# Patient Record
Sex: Male | Born: 1940 | Race: White | Hispanic: No | Marital: Single | Smoking: Never smoker
Health system: Southern US, Community
[De-identification: ages and names within clinical notes are randomized; demographics above are authoritative.]

## PROBLEM LIST (undated history)

## (undated) DIAGNOSIS — N189 Chronic kidney disease, unspecified: Secondary | ICD-10-CM

## (undated) DIAGNOSIS — E039 Hypothyroidism, unspecified: Secondary | ICD-10-CM

## (undated) DIAGNOSIS — F419 Anxiety disorder, unspecified: Secondary | ICD-10-CM

## (undated) DIAGNOSIS — M1990 Unspecified osteoarthritis, unspecified site: Secondary | ICD-10-CM

## (undated) DIAGNOSIS — R634 Abnormal weight loss: Secondary | ICD-10-CM

## (undated) DIAGNOSIS — K219 Gastro-esophageal reflux disease without esophagitis: Secondary | ICD-10-CM

## (undated) HISTORY — PX: LAMINECTOMY: SHX219

## (undated) HISTORY — PX: RENAL BIOPSY: SHX156

## (undated) HISTORY — PX: HAND SURGERY: SHX662

---

## 2000-09-07 ENCOUNTER — Ambulatory Visit (HOSPITAL_COMMUNITY): Admission: RE | Admit: 2000-09-07 | Discharge: 2000-09-07 | Payer: Self-pay | Admitting: Gastroenterology

## 2002-05-04 HISTORY — PX: ROTATOR CUFF REPAIR: SHX139

## 2004-03-21 ENCOUNTER — Ambulatory Visit: Payer: Self-pay | Admitting: Internal Medicine

## 2004-04-04 ENCOUNTER — Ambulatory Visit: Payer: Self-pay | Admitting: Internal Medicine

## 2004-05-28 ENCOUNTER — Ambulatory Visit: Payer: Self-pay | Admitting: Internal Medicine

## 2004-07-10 ENCOUNTER — Ambulatory Visit: Payer: Self-pay | Admitting: Internal Medicine

## 2004-12-29 ENCOUNTER — Ambulatory Visit: Payer: Self-pay | Admitting: Internal Medicine

## 2005-06-24 ENCOUNTER — Ambulatory Visit: Payer: Self-pay | Admitting: Internal Medicine

## 2011-04-14 ENCOUNTER — Other Ambulatory Visit (HOSPITAL_COMMUNITY): Payer: Self-pay | Admitting: Orthopaedic Surgery

## 2011-04-23 ENCOUNTER — Encounter (HOSPITAL_COMMUNITY): Payer: Self-pay | Admitting: Pharmacy Technician

## 2011-05-01 ENCOUNTER — Encounter (HOSPITAL_COMMUNITY)
Admission: RE | Admit: 2011-05-01 | Discharge: 2011-05-01 | Disposition: A | Discharge status: Home / Self Care | Payer: Medicare Other | Source: Ambulatory Visit | Attending: Orthopaedic Surgery | Admitting: Orthopaedic Surgery

## 2011-05-01 ENCOUNTER — Encounter (HOSPITAL_COMMUNITY): Payer: Self-pay

## 2011-05-01 HISTORY — DX: Gastro-esophageal reflux disease without esophagitis: K21.9

## 2011-05-01 HISTORY — DX: Unspecified osteoarthritis, unspecified site: M19.90

## 2011-05-01 LAB — CBC
MCH: 31.2 pg (ref 26.0–34.0)
MCHC: 33 g/dL (ref 30.0–36.0)
MCV: 94.4 fL (ref 78.0–100.0)
Platelets: 186 10*3/uL (ref 150–400)

## 2011-05-01 LAB — BASIC METABOLIC PANEL
BUN: 21 mg/dL (ref 6–23)
CO2: 30 mEq/L (ref 19–32)
Calcium: 10.6 mg/dL — ABNORMAL HIGH (ref 8.4–10.5)
GFR calc non Af Amer: 69 mL/min — ABNORMAL LOW (ref >90)
Glucose, Bld: 81 mg/dL (ref 70–99)

## 2011-05-01 LAB — URINALYSIS, ROUTINE W REFLEX MICROSCOPIC
Bilirubin Urine: NEGATIVE
Glucose, UA: NEGATIVE mg/dL
Ketones, ur: NEGATIVE mg/dL
Leukocytes, UA: NEGATIVE
Protein, ur: NEGATIVE mg/dL

## 2011-05-01 NOTE — Patient Instructions (Signed)
Lance [name]  05/01/2011   Your procedure is scheduled on:  Friday 05/08/2011  Report to [facility] at 0530 AM.  Call this number if you have problems the morning of surgery: [phone]   Remember:   Do not eat food:After Midnight.  May have clear liquids:until Midnight .  Clear liquids include soda, tea, black coffee, apple or grape juice, broth.  Take these medicines the morning of surgery with A SIP OF WATER: NONE   Do not wear jewelry, make-up or nail polish.  Do not wear lotions, powders, or perfumes.   Do not shave 48 hours prior to surgery.(women only)  Do not bring valuables to the hospital.  Contacts, dentures or bridgework may not be worn into surgery.  Leave suitcase in the car. After surgery it may be brought to your room.  For patients admitted to the hospital, checkout time is 11:00 AM the day of discharge.   Patients discharged the day of surgery will not be allowed to drive home.  Name and phone number of your driver:   Special Instructions: CHG Shower Use Special Wash: 1/2 bottle night before surgery and 1/2 bottle morning of surgery.   Please read over the following fact sheets that you were given: MRSA Information

## 2011-05-08 ENCOUNTER — Inpatient Hospital Stay (HOSPITAL_COMMUNITY)
Admission: RE | Admit: 2011-05-08 | Discharge: 2011-05-12 | DRG: 470 | Disposition: A | Discharge status: Home Health | Payer: Medicare Other | Source: Ambulatory Visit | Attending: Orthopaedic Surgery | Admitting: Orthopaedic Surgery

## 2011-05-08 ENCOUNTER — Encounter (HOSPITAL_COMMUNITY): Payer: Self-pay | Admitting: Anesthesiology

## 2011-05-08 ENCOUNTER — Encounter (HOSPITAL_COMMUNITY): Payer: Self-pay

## 2011-05-08 ENCOUNTER — Encounter (HOSPITAL_COMMUNITY)
Admission: RE | Disposition: A | Discharge status: Home Health | Payer: Self-pay | Source: Ambulatory Visit | Attending: Orthopaedic Surgery

## 2011-05-08 ENCOUNTER — Inpatient Hospital Stay (HOSPITAL_COMMUNITY): Payer: Medicare Other | Admitting: Anesthesiology

## 2011-05-08 ENCOUNTER — Other Ambulatory Visit: Payer: Self-pay

## 2011-05-08 ENCOUNTER — Inpatient Hospital Stay (HOSPITAL_COMMUNITY): Payer: Medicare Other

## 2011-05-08 DIAGNOSIS — M1610 Unilateral primary osteoarthritis, unspecified hip: Principal | ICD-10-CM | POA: Diagnosis present

## 2011-05-08 DIAGNOSIS — K219 Gastro-esophageal reflux disease without esophagitis: Secondary | ICD-10-CM | POA: Diagnosis present

## 2011-05-08 DIAGNOSIS — Z01812 Encounter for preprocedural laboratory examination: Secondary | ICD-10-CM

## 2011-05-08 DIAGNOSIS — M169 Osteoarthritis of hip, unspecified: Secondary | ICD-10-CM

## 2011-05-08 HISTORY — PX: TOTAL HIP ARTHROPLASTY: SHX124

## 2011-05-08 LAB — TYPE AND SCREEN
ABO/RH(D): O POS
Antibody Screen: NEGATIVE

## 2011-05-08 LAB — ABO/RH: ABO/RH(D): O POS

## 2011-05-08 SURGERY — ARTHROPLASTY, HIP, TOTAL, ANTERIOR APPROACH
Anesthesia: General | Site: Hip | Laterality: Right | Wound class: Clean

## 2011-05-08 MED ORDER — ROCURONIUM BROMIDE 100 MG/10ML IV SOLN
INTRAVENOUS | Status: DC | PRN
  Administered 2011-05-08: 20 mg via INTRAVENOUS
  Administered 2011-05-08: 50 mg via INTRAVENOUS
  Administered 2011-05-08: 10 mg via INTRAVENOUS

## 2011-05-08 MED ORDER — METOCLOPRAMIDE HCL 5 MG/ML IJ SOLN: 5.0000 mg | Freq: Three times a day (TID) | INTRAMUSCULAR | Status: DC | PRN

## 2011-05-08 MED ORDER — RIVAROXABAN 10 MG PO TABS
10.0000 mg | ORAL_TABLET | Freq: Every day | ORAL | Status: DC
  Administered 2011-05-09 – 2011-05-12 (×4): 10 mg via ORAL
  Filled 2011-05-08 (×4): qty 1

## 2011-05-08 MED ORDER — ACETAMINOPHEN 650 MG RE SUPP: 650.0000 mg | Freq: Four times a day (QID) | RECTAL | Status: DC | PRN

## 2011-05-08 MED ORDER — HYDROMORPHONE HCL PF 1 MG/ML IJ SOLN
0.2500 mg | INTRAMUSCULAR | Status: DC | PRN
  Administered 2011-05-08 (×2): 0.5 mg via INTRAVENOUS

## 2011-05-08 MED ORDER — OXYCODONE HCL 5 MG PO TABS
5.0000 mg | ORAL_TABLET | ORAL | Status: DC | PRN
Start: 2011-05-08 — End: 2011-05-12

## 2011-05-08 MED ORDER — MIDAZOLAM HCL 5 MG/5ML IJ SOLN
INTRAMUSCULAR | Status: DC | PRN
  Administered 2011-05-08: 2 mg via INTRAVENOUS

## 2011-05-08 MED ORDER — ONDANSETRON HCL 4 MG/2ML IJ SOLN
INTRAMUSCULAR | Status: DC | PRN
  Administered 2011-05-08: 4 mg via INTRAVENOUS

## 2011-05-08 MED ORDER — MORPHINE SULFATE (PF) 1 MG/ML IV SOLN
INTRAVENOUS | Status: DC
  Administered 2011-05-08: 13 mg via INTRAVENOUS
  Administered 2011-05-08: 4 mg via INTRAVENOUS
  Administered 2011-05-08: 1 mg via INTRAVENOUS
  Administered 2011-05-08: 11:00:00 via INTRAVENOUS
  Administered 2011-05-09: 5 mg via INTRAVENOUS
  Administered 2011-05-09: 6 mg via INTRAVENOUS
  Administered 2011-05-09: 7 mg via INTRAVENOUS
  Administered 2011-05-09: 08:00:00 via INTRAVENOUS
  Administered 2011-05-09: 3 mL via INTRAVENOUS
  Administered 2011-05-09: 9 mg via INTRAVENOUS
  Administered 2011-05-09: 3 mg via INTRAVENOUS
  Administered 2011-05-10: 08:00:00 via INTRAVENOUS
  Administered 2011-05-10: 3 mg via INTRAVENOUS
  Administered 2011-05-10: 5 mg via INTRAVENOUS
  Administered 2011-05-10: 12 mg via INTRAVENOUS
  Administered 2011-05-10: 4 mg via INTRAVENOUS
  Administered 2011-05-10: 3 mL via INTRAVENOUS
  Administered 2011-05-10: 2 mg via INTRAVENOUS
  Administered 2011-05-10: 2 mL via INTRAVENOUS
  Administered 2011-05-11: 2 mg via INTRAVENOUS
  Administered 2011-05-11: 3.96 mg via INTRAVENOUS
  Administered 2011-05-11: 6 mg via INTRAVENOUS
  Filled 2011-05-08 (×5): qty 25

## 2011-05-08 MED ORDER — ZOLPIDEM TARTRATE 5 MG PO TABS
5.0000 mg | ORAL_TABLET | Freq: Every evening | ORAL | Status: DC | PRN
  Administered 2011-05-11: 5 mg via ORAL
  Filled 2011-05-08: qty 1

## 2011-05-08 MED ORDER — PROPOFOL 10 MG/ML IV EMUL
INTRAVENOUS | Status: DC | PRN
  Administered 2011-05-08: 50 mg via INTRAVENOUS
  Administered 2011-05-08: 180 mg via INTRAVENOUS

## 2011-05-08 MED ORDER — ACETAMINOPHEN 10 MG/ML IV SOLN
INTRAVENOUS | Status: DC | PRN
  Administered 2011-05-08: 1000 mg via INTRAVENOUS

## 2011-05-08 MED ORDER — 0.9 % SODIUM CHLORIDE (POUR BTL) OPTIME
TOPICAL | Status: DC | PRN
  Administered 2011-05-08: 1000 mL

## 2011-05-08 MED ORDER — DOCUSATE SODIUM 100 MG PO CAPS
100.0000 mg | ORAL_CAPSULE | Freq: Two times a day (BID) | ORAL | Status: DC
  Administered 2011-05-08 – 2011-05-12 (×8): 100 mg via ORAL
  Filled 2011-05-08 (×10): qty 1

## 2011-05-08 MED ORDER — GLYCOPYRROLATE 0.2 MG/ML IJ SOLN
INTRAMUSCULAR | Status: DC | PRN
  Administered 2011-05-08: .5 mg via INTRAVENOUS

## 2011-05-08 MED ORDER — DIPHENHYDRAMINE HCL 12.5 MG/5ML PO ELIX: 12.5000 mg | ORAL_SOLUTION | ORAL | Status: DC | PRN

## 2011-05-08 MED ORDER — ONDANSETRON HCL 4 MG/2ML IJ SOLN: 4.0000 mg | Freq: Four times a day (QID) | INTRAMUSCULAR | Status: DC | PRN

## 2011-05-08 MED ORDER — ALUM & MAG HYDROXIDE-SIMETH 200-200-20 MG/5ML PO SUSP: 30.0000 mL | ORAL | Status: DC | PRN

## 2011-05-08 MED ORDER — PHENOL 1.4 % MT LIQD: 1.0000 | OROMUCOSAL | Status: DC | PRN

## 2011-05-08 MED ORDER — LIDOCAINE HCL (CARDIAC) 20 MG/ML IV SOLN
INTRAVENOUS | Status: DC | PRN
  Administered 2011-05-08: 100 mg via INTRAVENOUS

## 2011-05-08 MED ORDER — METHOCARBAMOL 500 MG PO TABS
500.0000 mg | ORAL_TABLET | Freq: Four times a day (QID) | ORAL | Status: DC | PRN
  Administered 2011-05-09 – 2011-05-11 (×6): 500 mg via ORAL
  Filled 2011-05-08 (×6): qty 1

## 2011-05-08 MED ORDER — METOCLOPRAMIDE HCL 10 MG PO TABS: 5.0000 mg | ORAL_TABLET | Freq: Three times a day (TID) | ORAL | Status: DC | PRN

## 2011-05-08 MED ORDER — NALOXONE HCL 0.4 MG/ML IJ SOLN: 0.4000 mg | INTRAMUSCULAR | Status: DC | PRN

## 2011-05-08 MED ORDER — MENTHOL 3 MG MT LOZG
1.0000 | LOZENGE | OROMUCOSAL | Status: DC | PRN
  Filled 2011-05-08: qty 9

## 2011-05-08 MED ORDER — CEFAZOLIN SODIUM 1-5 GM-% IV SOLN
1.0000 g | Freq: Three times a day (TID) | INTRAVENOUS | Status: AC
  Administered 2011-05-08 – 2011-05-09 (×3): 1 g via INTRAVENOUS
  Filled 2011-05-08 (×3): qty 50

## 2011-05-08 MED ORDER — SODIUM CHLORIDE 0.9 % IJ SOLN: 9.0000 mL | INTRAMUSCULAR | Status: DC | PRN

## 2011-05-08 MED ORDER — ACETAMINOPHEN 325 MG PO TABS: 650.0000 mg | ORAL_TABLET | Freq: Four times a day (QID) | ORAL | Status: DC | PRN

## 2011-05-08 MED ORDER — DIPHENHYDRAMINE HCL 50 MG/ML IJ SOLN: 12.5000 mg | Freq: Four times a day (QID) | INTRAMUSCULAR | Status: DC | PRN

## 2011-05-08 MED ORDER — LACTATED RINGERS IV SOLN
INTRAVENOUS | Status: DC
  Administered 2011-05-08: 1000 mL via INTRAVENOUS
  Administered 2011-05-08 (×2): via INTRAVENOUS

## 2011-05-08 MED ORDER — MORPHINE SULFATE 2 MG/ML IJ SOLN: 1.0000 mg | INTRAMUSCULAR | Status: DC | PRN

## 2011-05-08 MED ORDER — SIMVASTATIN 10 MG PO TABS
10.0000 mg | ORAL_TABLET | Freq: Every day | ORAL | Status: DC
  Administered 2011-05-08 – 2011-05-11 (×4): 10 mg via ORAL
  Filled 2011-05-08 (×5): qty 1

## 2011-05-08 MED ORDER — METHOCARBAMOL 100 MG/ML IJ SOLN
500.0000 mg | Freq: Four times a day (QID) | INTRAVENOUS | Status: DC | PRN
  Administered 2011-05-08 (×2): 500 mg via INTRAVENOUS
  Filled 2011-05-08 (×2): qty 5

## 2011-05-08 MED ORDER — ONDANSETRON HCL 4 MG PO TABS: 4.0000 mg | ORAL_TABLET | Freq: Four times a day (QID) | ORAL | Status: DC | PRN

## 2011-05-08 MED ORDER — PROMETHAZINE HCL 25 MG/ML IJ SOLN: 6.2500 mg | INTRAMUSCULAR | Status: DC | PRN

## 2011-05-08 MED ORDER — SODIUM CHLORIDE 0.9 % IV SOLN
INTRAVENOUS | Status: DC
  Administered 2011-05-08 – 2011-05-10 (×3): via INTRAVENOUS

## 2011-05-08 MED ORDER — CEFAZOLIN SODIUM 1-5 GM-% IV SOLN
1.0000 g | INTRAVENOUS | Status: AC
  Administered 2011-05-08: 1 g via INTRAVENOUS

## 2011-05-08 MED ORDER — NEOSTIGMINE METHYLSULFATE 1 MG/ML IJ SOLN
INTRAMUSCULAR | Status: DC | PRN
Start: 2011-05-08 — End: 2011-05-08
  Administered 2011-05-08: 4 mg via INTRAVENOUS

## 2011-05-08 MED ORDER — DIPHENHYDRAMINE HCL 12.5 MG/5ML PO ELIX
12.5000 mg | ORAL_SOLUTION | Freq: Four times a day (QID) | ORAL | Status: DC | PRN
  Filled 2011-05-08: qty 5

## 2011-05-08 MED ORDER — HYDROCODONE-ACETAMINOPHEN 5-325 MG PO TABS
1.0000 | ORAL_TABLET | ORAL | Status: DC | PRN
  Administered 2011-05-11: 2 via ORAL
  Administered 2011-05-11: 1 via ORAL
  Administered 2011-05-11 – 2011-05-12 (×4): 2 via ORAL
  Filled 2011-05-08 (×2): qty 2
  Filled 2011-05-08: qty 1
  Filled 2011-05-08 (×3): qty 2

## 2011-05-08 MED ORDER — SUFENTANIL CITRATE 50 MCG/ML IV SOLN
INTRAVENOUS | Status: DC | PRN
  Administered 2011-05-08 (×3): 10 ug via INTRAVENOUS
  Administered 2011-05-08: 15 ug via INTRAVENOUS
  Administered 2011-05-08: 5 ug via INTRAVENOUS
  Administered 2011-05-08: 10 ug via INTRAVENOUS
  Administered 2011-05-08: 15 ug via INTRAVENOUS
  Administered 2011-05-08: 10 ug via INTRAVENOUS

## 2011-05-08 MED ORDER — HYDROMORPHONE HCL PF 1 MG/ML IJ SOLN
INTRAMUSCULAR | Status: DC | PRN
  Administered 2011-05-08: 1 mg via INTRAVENOUS

## 2011-05-08 SURGICAL SUPPLY — 34 items
BAG ZIPLOCK 12X15 (MISCELLANEOUS) ×4 IMPLANT
BLADE SAW SGTL 18X1.27X75 (BLADE) ×2 IMPLANT
CELLS DAT CNTRL 66122 CELL SVR (MISCELLANEOUS) ×1 IMPLANT
CLOTH BEACON ORANGE TIMEOUT ST (SAFETY) ×2 IMPLANT
DRAPE C-ARM 42X72 X-RAY (DRAPES) ×2 IMPLANT
DRAPE STERI IOBAN 125X83 (DRAPES) ×2 IMPLANT
DRAPE U-SHAPE 47X51 STRL (DRAPES) ×6 IMPLANT
DRSG MEPILEX BORDER 4X8 (GAUZE/BANDAGES/DRESSINGS) ×2 IMPLANT
DURAPREP 26ML APPLICATOR (WOUND CARE) ×2 IMPLANT
ELECT BLADE TIP CTD 4 INCH (ELECTRODE) ×2 IMPLANT
ELECT REM PT RETURN 9FT ADLT (ELECTROSURGICAL) ×2
ELECTRODE REM PT RTRN 9FT ADLT (ELECTROSURGICAL) ×1 IMPLANT
FACESHIELD LNG OPTICON STERILE (SAFETY) ×8 IMPLANT
GAUZE XEROFORM 1X8 LF (GAUZE/BANDAGES/DRESSINGS) ×2 IMPLANT
GLOVE BIO SURGEON STRL SZ7 (GLOVE) IMPLANT
GLOVE BIO SURGEON STRL SZ7.5 (GLOVE) ×2 IMPLANT
GLOVE BIOGEL PI IND STRL 7.5 (GLOVE) IMPLANT
GLOVE BIOGEL PI IND STRL 8 (GLOVE) ×1 IMPLANT
GLOVE BIOGEL PI INDICATOR 7.5 (GLOVE)
GLOVE BIOGEL PI INDICATOR 8 (GLOVE) ×1
GLOVE ECLIPSE 7.0 STRL STRAW (GLOVE) ×2 IMPLANT
GOWN STRL REIN XL XLG (GOWN DISPOSABLE) ×2 IMPLANT
KIT BASIN OR (CUSTOM PROCEDURE TRAY) ×2 IMPLANT
PACK TOTAL JOINT (CUSTOM PROCEDURE TRAY) ×2 IMPLANT
PADDING CAST COTTON 6X4 STRL (CAST SUPPLIES) ×2 IMPLANT
RTRCTR WOUND ALEXIS 18CM MED (MISCELLANEOUS) ×2
STAPLER SKIN PROX WIDE 3.9 (STAPLE) IMPLANT
SUT ETHIBOND NAB CT1 #1 30IN (SUTURE) ×2 IMPLANT
SUT VIC AB 1 CT1 36 (SUTURE) ×2 IMPLANT
SUT VIC AB 2-0 CT1 27 (SUTURE) ×1
SUT VIC AB 2-0 CT1 TAPERPNT 27 (SUTURE) ×1 IMPLANT
TOWEL OR 17X26 10 PK STRL BLUE (TOWEL DISPOSABLE) ×4 IMPLANT
TOWEL OR NON WOVEN STRL DISP B (DISPOSABLE) ×2 IMPLANT
TRAY FOLEY CATH 14FRSI W/METER (CATHETERS) ×2 IMPLANT

## 2011-05-08 NOTE — Brief Op Note (Signed)
05/08/2011  9:43 AM  PATIENT:  Lance [name]  71 y.o. male  PRE-OPERATIVE DIAGNOSIS:  Severe osteoarthritis Right Hip  POST-OPERATIVE DIAGNOSIS:  Severe osteoarthritis Right Hip  PROCEDURE:  Procedure(s): TOTAL HIP ARTHROPLASTY ANTERIOR APPROACH  SURGEON:  Surgeon(s): Kathryne Hitch  PHYSICIAN ASSISTANT:   ASSISTANTS: none   ANESTHESIA:   general  EBL:  Total I/O In: 2000 [I.V.:2000] Out: 450 [Urine:150; Blood:300]  BLOOD ADMINISTERED:none  DRAINS: none   LOCAL MEDICATIONS USED:  NONE  SPECIMEN:  No Specimen  DISPOSITION OF SPECIMEN:  N/A  COUNTS:  YES  TOURNIQUET:  * No tourniquets in log *  DICTATION: .Other Dictation: Dictation Number [id number]  PLAN OF CARE: Admit to inpatient   PATIENT DISPOSITION:  PACU - hemodynamically stable.   Delay start of Pharmacological VTE agent (>24hrs) due to surgical blood loss or risk of bleeding:  {YES/NO/NOT APPLICABLE:20182

## 2011-05-08 NOTE — Progress Notes (Signed)
Utilization review completed.  

## 2011-05-08 NOTE — Anesthesia Preprocedure Evaluation (Addendum)
Anesthesia Evaluation  Patient identified by MRN, date of birth, ID band Patient awake    Reviewed: Allergy & Precautions, H&P , NPO status , Patient's Chart, lab work & pertinent test results, reviewed documented beta blocker date and time   Airway Mallampati: II TM Distance: >3 FB Neck ROM: Full    Dental   Pulmonary neg pulmonary ROS,  clear to auscultation        Cardiovascular neg cardio ROS Regular Normal Denies cardiac symptoms   Neuro/Psych Negative Neurological ROS  Negative Psych ROS   GI/Hepatic negative GI ROS, Neg liver ROS,   Endo/Other  Negative Endocrine ROS  Renal/GU negative Renal ROS  Genitourinary negative   Musculoskeletal negative musculoskeletal ROS (+)   Abdominal   Peds negative pediatric ROS (+)  Hematology negative hematology ROS (+)   Anesthesia Other Findings Lower front bridge  Reproductive/Obstetrics negative OB ROS                           Anesthesia Physical Anesthesia Plan  ASA: I  Anesthesia Plan: General   Post-op Pain Management:    Induction: Intravenous  Airway Management Planned: Oral ETT  Additional Equipment:   Intra-op Plan:   Post-operative Plan: Extubation in OR  Informed Consent: I have reviewed the patients History and Physical, chart, labs and discussed the procedure including the risks, benefits and alternatives for the proposed anesthesia with the patient or authorized representative who has indicated his/her understanding and acceptance.   Dental advisory given  Plan Discussed with: CRNA and Surgeon  Anesthesia Plan Comments:        Anesthesia Quick Evaluation

## 2011-05-08 NOTE — Transfer of Care (Signed)
Immediate Anesthesia Transfer of Care Note  Patient: Lance [name]  Procedure(s) Performed:  TOTAL HIP ARTHROPLASTY ANTERIOR APPROACH - Right Total Hip Arthroplasty, Direct Anterior Approach (C-Arm)  Patient Location: PACU  Anesthesia Type: General  Level of Consciousness: awake, oriented, sedated and responds to stimulation  Airway & Oxygen Therapy: Patient Spontanous Breathing and Patient connected to face mask oxygen  Post-op Assessment: Report given to PACU RN and Post -op Vital signs reviewed and stable  Post vital signs: stable  Complications: No apparent anesthesia complications

## 2011-05-08 NOTE — H&P (Signed)
Lance [name] is an 71 y.o. male.   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  This had lead to severe right hip pain and degenerative arthritis which had greatly effected his quality of life.  He now wishes to proceed with a right total hip replacement given the failure of conservative treatment.  He understands fully the risks of acute blood loss anemia, DVT, PE and the difficulty involved with his deformity.  The goals are decreased pain and improved function and quality of life.  Past Medical History  Diagnosis Date  . GERD (gastroesophageal reflux disease)   . Arthritis     osteoarthritis    Past Surgical History  Procedure Date  . Rotator cuff repair 2004    right rotator cuff repair    History reviewed. No pertinent family history. Social History:  does not have a smoking history on file. He does not have any smokeless tobacco history on file. He reports that he drinks alcohol. He reports that he does not use illicit drugs.  Allergies: No Known Allergies  Medications Prior to Admission  Medication Dose Route Frequency Provider Last Rate Last Dose  . ceFAZolin (ANCEF) IVPB 1 g/50 mL premix  1 g Intravenous 60 min Pre-Op Kathryne Hitch       No current outpatient prescriptions on file as of 05/08/2011.    No results found for this or any previous visit (from the past 48 hour(s)). No results found.  Review of Systems  All other systems reviewed and are negative.    Blood pressure 136/90, pulse 98, temperature 97.5 F (36.4 C), temperature source Oral, resp. rate 18, SpO2 97.00%. Physical Exam  Constitutional: He is oriented to person, place, and time. He appears well-developed and well-nourished.  HENT:  Head: Normocephalic and atraumatic.  Eyes: EOM are normal. Pupils are equal, round, and reactive to light.  Neck: Normal range of motion. Neck supple.  Cardiovascular: Normal  rate and regular rhythm.   Respiratory: Effort normal and breath sounds normal.  GI: Soft. Bowel sounds are normal.  Musculoskeletal:       Right hip: He exhibits decreased range of motion, bony tenderness and crepitus.  Neurological: He is alert and oriented to person, place, and time.  Skin: Skin is warm and dry.  Psychiatric: He has a normal mood and affect.     Assessment/Plan To the OR today for a right total hip replacement followed by admission as an inpatient.  Lerline Valdivia Y 05/08/2011, 6:59 AM

## 2011-05-08 NOTE — Anesthesia Postprocedure Evaluation (Signed)
  Anesthesia Post-op Note  Patient: Lance [name]  Procedure(s) Performed:  TOTAL HIP ARTHROPLASTY ANTERIOR APPROACH - Right Total Hip Arthroplasty, Direct Anterior Approach (C-Arm)  Patient Location: PACU  Anesthesia Type: General  Level of Consciousness: oriented and sedated  Airway and Oxygen Therapy: Patient Spontanous Breathing and Patient connected to nasal cannula oxygen  Post-op Pain: mild  Post-op Assessment: Post-op Vital signs reviewed, Patient's Cardiovascular Status Stable, Respiratory Function Stable and Patent Airway  Post-op Vital Signs: stable  Complications: No apparent anesthesia complications

## 2011-05-09 ENCOUNTER — Encounter (HOSPITAL_COMMUNITY): Payer: Self-pay | Admitting: Orthopaedic Surgery

## 2011-05-09 LAB — CBC
MCHC: 33.6 g/dL (ref 30.0–36.0)
Platelets: 166 10*3/uL (ref 150–400)
RDW: 14 % (ref 11.5–15.5)
WBC: 5.4 10*3/uL (ref 4.0–10.5)

## 2011-05-09 LAB — BASIC METABOLIC PANEL
Chloride: 100 mEq/L (ref 96–112)
GFR calc Af Amer: 86 mL/min — ABNORMAL LOW (ref >90)
GFR calc non Af Amer: 74 mL/min — ABNORMAL LOW (ref >90)
Potassium: 4.5 mEq/L (ref 3.5–5.1)

## 2011-05-09 NOTE — Progress Notes (Signed)
Physical Therapy Evaluation Patient Details Name: Lance [name] MRN: [id number] DOB: January 26, 1941 Today's Date: 05/09/2011 [id number] Problem List:  Patient Active Problem List  Diagnoses  . Degenerative arthritis of hip    Past Medical History:  Past Medical History  Diagnosis Date  . GERD (gastroesophageal reflux disease)   . Arthritis     osteoarthritis   Past Surgical History:  Past Surgical History  Procedure Date  . Rotator cuff repair 2004    right rotator cuff repair    PT Assessment/Plan/Recommendation PT Assessment Clinical Impression Statement: pt s/p direct Ant. THA tolerated ambulation first time up.  Pt can benefit from PT to maximize functional mobility, ROM, strength to DC to home PT Recommendation/Assessment: Patient will need skilled PT in the acute care venue PT Problem List: Decreased strength;Decreased range of motion;Decreased activity tolerance;Decreased mobility;Decreased knowledge of precautions;Pain PT Therapy Diagnosis : Difficulty walking;Acute pain PT Plan PT Frequency: 7X/week PT Treatment/Interventions: DME instruction;Gait training;Stair training;Functional mobility training;Therapeutic activities;Therapeutic exercise;Patient/family education PT Recommendation Recommendations for Other Services: OT consult Follow Up Recommendations: Home health PT;24 hour supervision/assistance Equipment Recommended:  (pt to find out if he has access to RW or Sw) PT Goals  Acute Rehab PT Goals PT Goal Formulation: With patient Time For Goal Achievement: 7 days Pt will go Supine/Side to Sit: with supervision;with +2 total assist PT Goal: Supine/Side to Sit - Progress: Not met Pt will go Sit to Supine/Side: with supervision PT Goal: Sit to Supine/Side - Progress: Not met Pt will go Sit to Stand: with supervision PT Goal: Sit to Stand - Progress: Not met Pt will go Stand to Sit: with supervision PT Goal: Stand to Sit - Progress: Not met Pt will  Ambulate: >150 feet;with supervision;with rolling walker PT Goal: Ambulate - Progress: Not met Pt will Go Up / Down Stairs: 1-2 stairs;with min assist;with least restrictive assistive device PT Goal: Up/Down Stairs - Progress: Not met Pt will Perform Home Exercise Program: with supervision, verbal cues required/provided  PT Evaluation Precautions/Restrictions  Restrictions Weight Bearing Restrictions: No Prior Functioning  Home Living Lives With: Family Receives Help From: Family Type of Home: House Home Layout: One level Home Access: Stairs to enter Entrance Stairs-Rails: None Entrance Stairs-Number of Steps: 1 (1 x 2) Home Adaptive Equipment:  (pt unsure if RW orSW) Prior Function Level of Independence: Independent with basic ADLs Able to Take Stairs?: Yes Driving: Yes Vocation: Retired Financial risk analyst Arousal/Alertness: Awake/alert Overall Cognitive Status: Appears within functional limits for tasks assessed Orientation Level: Oriented X4 Sensation/Coordination Sensation Light Touch: Appears Intact Coordination Gross Motor Movements are Fluid and Coordinated: Yes Fine Motor Movements are Fluid and Coordinated: Yes Extremity Assessment RUE Assessment RUE Assessment: Within Functional Limits LUE Assessment LUE Assessment: Within Functional Limits RLE Assessment RLE Assessment: Exceptions to [facility] RLE Strength RLE Overall Strength Comments: pt able to advance RLE, did requiore assist with heel slides LLE Assessment LLE Assessment: Within Functional Limits Mobility (including Balance) Bed Mobility Bed Mobility: Yes Supine to Sit: 4: Min assist;With rails;HOB elevated (Comment degrees) Supine to Sit Details (indicate cue type and reason): HOB @ 30, vc to turn body on bed, assist for RLE using sheet to move RLE Transfers Transfers: Yes Sit to Stand: 4: Min assist;With upper extremity assist;From bed Sit to Stand Details (indicate cue type and reason): vc to push  from bed Stand to Sit: 4: Min assist;To chair/3-in-1;With upper extremity assist Stand to Sit Details: vc to place RLE for ward and to reach back Ambulation/Gait Ambulation/Gait: Yes  Ambulation/Gait Assistance: 1: +2 Total assist;Patient percentage (comment) (pt=75) Ambulation/Gait Assistance Details (indicate cue type and reason): vc for sequence , step length and position inside RW Ambulation Distance (Feet): 50 Feet Assistive device: Rolling walker Gait Pattern: Step-through pattern (initially small steps, improved with practice)  Posture/Postural Control Posture/Postural Control: No significant limitations Exercise  Total Joint Exercises Ankle Circles/Pumps: AROM;Both;10 reps Quad Sets: AROM;Right;10 reps Heel Slides: AAROM;Right;10 reps;Supine End of Session PT - End of Session Activity Tolerance: Patient tolerated treatment well Patient left: in chair;with call bell in reach Nurse Communication: Mobility status for transfers (need for muscle relaxer) General Behavior During Session: [facility] for tasks performed Cognition: [facility] for tasks performed  Rada Hay 05/09/2011, 3:10 PM

## 2011-05-09 NOTE — Progress Notes (Signed)
Physical Therapy Treatment Patient Details Name: Lance [name] MRN: [id number] DOB: 04-15-1941 Today's Date: 05/09/2011 [id number] 2g PT Assessment/Plan  PT - Assessment/Plan Comments on Treatment Session: pt is tolerating well, c/o burning more than pain PT Plan: Discharge plan remains appropriate;Frequency remains appropriate PT Frequency: 7X/week Follow Up Recommendations: Home health PT;24 hour supervision/assistance Equipment Recommended:  (may need RW or May be able to use SW if neccessary) PT Goals  Acute Rehab PT Goals PT Goal Formulation: With patient Time For Goal Achievement: 7 days Pt will go Supine/Side to Sit: with supervision PT Goal: Supine/Side to Sit - Progress: Progressing toward goal Pt will go Sit to Supine/Side: with supervision PT Goal: Sit to Supine/Side - Progress: Not met Pt will go Sit to Stand: with supervision PT Goal: Sit to Stand - Progress: Progressing toward goal Pt will go Stand to Sit: with supervision PT Goal: Stand to Sit - Progress: Progressing toward goal Pt will Ambulate: >150 feet;with supervision;with rolling walker PT Goal: Ambulate - Progress: Progressing toward goal Pt will Go Up / Down Stairs: 1-2 stairs;with min assist;with least restrictive assistive device PT Goal: Up/Down Stairs - Progress: Not met Pt will Perform Home Exercise Program: with supervision, verbal cues required/provided  PT Treatment Precautions/Restrictions  Restrictions Weight Bearing Restrictions: No Mobility (including Balance) Bed Mobility Bed Mobility: Yes Sit to supine with Min Assist for RLE onto bed from Left side of bed Transfers Transfers: Yes Sit to Stand: 4: Min assist;With upper extremity assist;From chair/3-in-1 Sit to Stand Details (indicate cue type and reason): vc to push from chair Stand to Sit: 4: Min assist;With upper extremity assist;To bed Stand to Sit Details: vc to place RLE Ambulation/Gait Ambulation/Gait: Yes Ambulation/Gait  Assistance: Patient percentage (comment);4: Min assist (pt=75) Ambulation/Gait Assistance Details (indicate cue type and reason): vc for sequence Ambulation Distance (Feet): 75 Feet Assistive device: Rolling walker Gait Pattern: Step-through pattern (initially small steps, improved with practice)  Posture/Postural Control Posture/Postural Control: No significant limitations Exercise   End of Session PT - End of Session Activity Tolerance: Patient tolerated treatment well Patient left: in bed;with call bell in reach Nurse Communication: Mobility status for transfers General Behavior During Session: [facility] for tasks performed Cognition: [facility] for tasks performed  Rada Hay 05/09/2011, 3:38 PM

## 2011-05-09 NOTE — Op Note (Signed)
NAMESKYLOR, [name]             ACCOUNT NO.:  [id number]  MEDICAL RECORD NO.:  [id number]  LOCATION:  1619                         FACILITY:  [facility]  PHYSICIAN:  Vanita Panda. Magnus Ivan, M.D.DATE OF BIRTH:  [birth year]  DATE OF PROCEDURE:  05/08/2011 DATE OF DISCHARGE:                              OPERATIVE REPORT   PREOPERATIVE DIAGNOSIS:  Severe degenerative joint disease, right hip, with protrusio.  POSTOPERATIVE DIAGNOSIS:  Severe degenerative joint disease, right hip, with protrusio.  PROCEDURE:  Right total hip arthroplasty with a direct anterior approach.  IMPLANTS:  DePuy Sector Gription acetabular component size 54, size 36 +4 neutral polyethylene liner, size 12 Corail femoral component with a collar and high offset (KLA), 36 -2 metal hip ball.  SURGEON:  Vanita Panda. Magnus Ivan, M.D.  ANESTHESIA:  General.  BLOOD LOSS:  500-600 cc.  COMPLICATIONS:  None.  INDICATIONS:  Lance [name] is a 71 year old gentleman with worsening arthritis involving his right hip.  He has radiographic evidence of severe protrusio bilaterally with the right significantly worse than the left.  At this point, he wished to proceed with a total hip replacement given the failure of conservative non-operative treatment.  The risks and benefits of this have been explained to him in detail and he does wish to proceed with surgery.  PROCEDURE DESCRIPTION:  After informed consent was obtained, appropriate right hip was marked.  He was brought to the operating room and general anesthesia was obtained while he was on the stretcher.  A Foley catheter was then placed and traction boots were placed on his feet and he could be placed on the Hana fracture table.  Both feet were placed in in-line skeletal traction, but no traction applied.  A perineal post was placed as well.  Under direct fluoroscopic guidance, we were able to assess the hip center as well as the center of the pelvis for measuring  leg lengths.  Next, his right hip was prepped and draped with DuraPrep and sterile drapes.  A time-out was called and he was identified as the correct patient, correct right hip.  I then made an incision just inferior and posterior to the anterior superior iliac spine and carried this obliquely down the leg.  I dissected down to the tensor fascia lata, and this was divided longitudinally.  I then proceeded with a direct anterior approach to the hip.  Retractors were placed around the lateral neck and medially after I made my capsulotomy.  The head was significantly protruded into the pelvis.  I coagulated the lateral femoral circumflex vessels, and I made my femoral neck cut proximal to the lesser trochanter.  I then removed the femoral head in its entirety. I began reaming from a size 42 reamer in the acetabulum up to a size 53 with the last few reamers placed under direct fluoroscopic guidance, so did not medialize given his protrusio.  I then used some reamings from the femoral head to pack his bone graft and placed the real size 54 acetabular component.  This was a solid fit, and so I did not need to augment this with any screws.  I placed the real 36 +4 neutral polyethylene liner and attention was turned  to the femur.  The leg was externally rotated to 90 degrees, extended, and adducted to allow access to the femoral canal.  I cleaned out the femoral canal debris and then began broaching from a size 8 broach up to a size 12.  I did recognize that I had broached the posterior cortex and had to reposition my broach to get a good solid fit.  I then placed a trial 36 -2 hip ball and reduced this in the acetabulum.  It did feel like it was longer, but it was stable and the fact that he has got significant protrusio, it is hard to mimic his original anatomy.  Given his disease on his other hip, I am encouraged that we will eventually be able to get his leg lengths equal, but stability was  more important.  It was stable with the trial implants.  I then removed the trial implants and placed the real size 12 femoral component followed by the real 36 -2 femoral hip ball and reduced this in the acetabulum and again it was stable throughout its arc of motion and rotation.  I then copiously irrigated the deep tissues and I closed the, what I could find, remnants of the capsule with a #1 Ethibond suture.  I did a running #1 Vicryl in the tensor fascia lata, interrupted 2-0 Vicryl in the subcutaneous tissue, and staples on the skin.  A well-padded sterile dressing was applied and the patient was taken off the Hana table, awakened, extubated, and taken to the recovery room in stable condition.  All final counts were correct.     Vanita Panda. Magnus Ivan, M.D.     CYB/MEDQ  D:  05/08/2011  T:  05/09/2011  Job:  [id number]

## 2011-05-09 NOTE — Progress Notes (Signed)
Subjective: 1 Day Post-Op Procedure(s) (LRB): TOTAL HIP ARTHROPLASTY ANTERIOR APPROACH (Right) Patient reports pain as moderate.    Objective: Vital signs in last 24 hours: Temp:  [97 F (36.1 C)-98.3 F (36.8 C)] 98.3 F (36.8 C) (01/05 0500) Pulse Rate:  [79-102] 91  (01/05 0500) Resp:  [12-18] 18  (01/05 0817) BP: (103-126)/(67-77) 126/77 mmHg (01/05 0500) SpO2:  [94 %-100 %] 97 % (01/05 0817) FiO2 (%):  [100 %] 100 % (01/05 0400) Weight:  [73.936 kg (163 lb)] 163 lb (73.936 kg) (01/04 1500)  Intake/Output from previous day: 01/04 0701 - 01/05 0700 In: 4980 [P.O.:1080; I.V.:3900] Out: 2850 [Urine:2550; Blood:300] Intake/Output this shift: Total I/O In: 240 [P.O.:240] Out: -    Basename 05/09/11 0520  HGB 13.1    Basename 05/09/11 0520  WBC 5.4  RBC 4.19*  HCT 39.0  PLT 166    Basename 05/09/11 0520  NA 134*  K 4.5  CL 100  CO2 27  BUN 13  CREATININE 1.01  GLUCOSE 100*  CALCIUM 9.0   No results found for this basename: LABPT:2,INR:2 in the last 72 hours  Sensation intact distally Intact pulses distally Dorsiflexion/Plantar flexion intact Incision: moderate drainage  Assessment/Plan: 1 Day Post-Op Procedure(s) (LRB): TOTAL HIP ARTHROPLASTY ANTERIOR APPROACH (Right) Up with therapy  Jaan Fischel Y 05/09/2011, 11:09 AM

## 2011-05-10 LAB — CBC
HCT: 36.2 % — ABNORMAL LOW (ref 39.0–52.0)
Hemoglobin: 12.1 g/dL — ABNORMAL LOW (ref 13.0–17.0)
MCHC: 33.4 g/dL (ref 30.0–36.0)
RBC: 3.91 MIL/uL — ABNORMAL LOW (ref 4.22–5.81)
RDW: 14 % (ref 11.5–15.5)
WBC: 6.2 10*3/uL (ref 4.0–10.5)

## 2011-05-10 NOTE — Progress Notes (Signed)
Patient ID: Lance [name], male   DOB: 07-Mar-1941, 71 y.o.   MRN: [id number] No acute changes overnight. Incision looks good AF/VSS Hgb stable Leg lengths good  Continue therapy

## 2011-05-10 NOTE — Progress Notes (Signed)
Occupational Therapy Evaluation Patient Details Name: Lance [name] MRN: [id number] DOB: 11/09/1940 Today's Date: 05/10/2011  Problem List:  Patient Active Problem List  Diagnoses  . Degenerative arthritis of hip    Past Medical History:  Past Medical History  Diagnosis Date  . GERD (gastroesophageal reflux disease)   . Arthritis     osteoarthritis   Past Surgical History:  Past Surgical History  Procedure Date  . Rotator cuff repair 2004    right rotator cuff repair    OT Assessment/Plan/Recommendation OT Assessment Clinical Impression Statement: PT s/p R THR ant approach. All education completed with pt. Pt demonstrated and verbalized understanding. No further needs. Pt needs 3 in 1 for D/C. OT Recommendation/Assessment: Patient does not need any further OT services OT Recommendation Equipment Recommended: 3 in 1 bedside comode (pt to check on RW availability) OT Goals Acute Rehab OT Goals OT Goal Formulation:  (eval only)  OT Evaluation Precautions/Restrictions  Restrictions Weight Bearing Restrictions: No Other Position/Activity Restrictions: WBAT Prior Functioning Home Living Lives With: Significant other Receives Help From: Family Type of Home: House Home Layout: One level Home Access: Stairs to enter [employer] Shower/Tub: Walk-in shower;Door [employer] Toilet: Handicapped height Bathroom Accessibility: Yes How Accessible: Accessible via walker Home Adaptive Equipment: Walker - rolling Prior Function Level of Independence: Independent with basic ADLs;Independent with homemaking with ambulation Able to Take Stairs?: Yes Driving: Yes Vocation: Retired ADL ADL Eating/Feeding: Performed;Independent Where Assessed - Eating/Feeding: Chair Grooming: Shaving;Wash/dry hands;Wash/dry face;Teeth care;Brushing hair;Performed;Set up Where Assessed - Grooming: Sitting, chair Upper Body Bathing: Simulated;Chest;Right arm;Left arm;Abdomen;Set up Where Assessed -  Upper Body Bathing: Sitting, chair Lower Body Bathing: Set up Where Assessed - Lower Body Bathing: Sit to stand from chair Upper Body Dressing: Set up Where Assessed - Upper Body Dressing: Unsupported;Sit to stand from chair Lower Body Dressing: Moderate assistance Where Assessed - Lower Body Dressing: Sit to stand from chair Toilet Transfer: Supervision/safety Toilet Transfer Method: Proofreader: Bedside commode Toileting - Clothing Manipulation: Independent Where Assessed - Glass blower/designer Manipulation: Standing Toileting - Hygiene: Independent Where Assessed - Toileting Hygiene: Sit to stand from 3-in-1 or toilet Tub/Shower Transfer: Simulated;Supervision/safety Tub/Shower Transfer Method: Science writer:  (3 in 1) Equipment Used: Reacher;Rolling walker;Sock aid ADL Comments: Pt states that partner will help as needed with ADL Vision/Perception  Vision - History Baseline Vision: No visual deficits Perception Perception: Within Functional Limits Praxis Praxis: Intact Cognition Cognition Arousal/Alertness: Awake/alert Overall Cognitive Status: Appears within functional limits for tasks assessed   Extremity Assessment RUE Assessment RUE Assessment: Within Functional Limits LUE Assessment LUE Assessment: Within Functional Limits Mobility  Bed Mobility Bed Mobility: Yes Supine to Sit: 4: Min assist;HOB elevated (Comment degrees) Transfers Transfers: Yes Sit to Stand: With upper extremity assist;From bed;5: Supervision Sit to Stand Details (indicate cue type and reason): vc to push from armsrests Stand to Sit: With upper extremity assist;5: Supervision;To bed;To elevated surface Stand to Sit Details: vc to place RLE forward for comfort End of Session General Behavior During Session: [facility] for tasks performed Cognition: [facility] for tasks performed   [facility] 05/10/2011, 5:08 PM

## 2011-05-10 NOTE — Progress Notes (Signed)
Physical Therapy Treatment Patient Details Name: Lance [name] MRN: [id number] DOB: Sep 03, 1940 Today's Date: 05/10/2011 4098-1191 e [place] g PT Assessment/Plan  PT - Assessment/Plan Comments on Treatment Session: pt improving with mobility, ambulation, states decrease in ant. thigh burning PT Plan: Discharge plan remains appropriate;Frequency remains appropriate PT Frequency: 7X/week Follow Up Recommendations: Home health PT;24 hour supervision/assistance Equipment Recommended:  (pt to check on RW availability) PT Goals  Acute Rehab PT Goals PT Goal Formulation: With patient Time For Goal Achievement: 7 days Pt will go Supine/Side to Sit: with supervision PT Goal: Supine/Side to Sit - Progress: Progressing toward goal Pt will go Sit to Supine/Side: with supervision PT Goal: Sit to Supine/Side - Progress: Progressing toward goal Pt will go Sit to Stand: with supervision PT Goal: Sit to Stand - Progress: Progressing toward goal Pt will go Stand to Sit: with supervision PT Goal: Stand to Sit - Progress: Progressing toward goal Pt will Ambulate: >150 feet;with supervision;with rolling walker PT Goal: Ambulate - Progress: Progressing toward goal Pt will Perform Home Exercise Program: with supervision, verbal cues required/provided PT Goal: Perform Home Exercise Program - Progress: Progressing toward goal  PT Treatment Precautions/Restrictions  Restrictions Weight Bearing Restrictions: No Mobility (including Balance) Bed Mobility Bed Mobility: Yes Supine to Sit: 4: Min assist;HOB elevated (Comment degrees) Supine to Sit Details (indicate cue type and reason): pt used sheet to slide RLe to edge with vc for technique Transfers Transfers: Yes Sit to Stand: With upper extremity assist;From bed;5: Supervision Sit to Stand Details (indicate cue type and reason): vc t push from bed Stand to Sit: To chair/3-in-1;With upper extremity assist;5: Supervision Stand to Sit Details: vc to step  RLE out  for comfort Ambulation/Gait Ambulation/Gait: Yes Ambulation/Gait Assistance: 4: Min assist (pt=75) Ambulation/Gait Assistance Details (indicate cue type and reason): pt had improved step length Ambulation Distance (Feet): 150 Feet Assistive device: Rolling walker Gait Pattern: Step-through pattern (initially small steps, improved with practice) Gait velocity: increased speed  Posture/Postural Control Posture/Postural Control: No significant limitations Exercise  Total Joint Exercises Ankle Circles/Pumps: AROM;Both;10 reps Quad Sets: AROM;Right;10 reps Short Arc Quad: AROM;Right;10 reps;Supine Heel Slides: AAROM;Right;10 reps;Supine Hip ABduction/ADduction: AAROM;Right;10 reps;Supine End of Session PT - End of Session Activity Tolerance: Patient tolerated treatment well Patient left: in chair;with call bell in reach General Behavior During Session: [facility] for tasks performed Cognition: [facility] for tasks performed  Rada Hay 05/10/2011, 1:04 PM

## 2011-05-10 NOTE — Progress Notes (Signed)
Physical Therapy Treatment Patient Details Name: Lance [name] MRN: [id number] DOB: 25-Aug-1940 Today's Date: 05/10/2011 [id number] 2g PT Assessment/Plan  PT - Assessment/Plan Comments on Treatment Session: Pt pushed through pain this PM.  pt encouraged to perform exercises PT Plan: Discharge plan remains appropriate;Frequency remains appropriate PT Frequency: 7X/week Follow Up Recommendations: Home health PT;24 hour supervision/assistance Equipment Recommended:  (pt to check on RW availability) PT Goals  Acute Rehab PT Goals PT Goal Formulation: With patient Time For Goal Achievement: 7 days Pt will go Supine/Side to Sit: with supervision PT Goal: Supine/Side to Sit - Progress: Progressing toward goal Pt will go Sit to Supine/Side: with supervision PT Goal: Sit to Supine/Side - Progress: Progressing toward goal Pt will go Sit to Stand: with supervision PT Goal: Sit to Stand - Progress: Progressing toward goal Pt will go Stand to Sit: with supervision PT Goal: Stand to Sit - Progress: Progressing toward goal Pt will Ambulate: >150 feet;with supervision;with rolling walker PT Goal: Ambulate - Progress: Progressing toward goal Pt will Perform Home Exercise Program: with supervision, verbal cues required/provided PT Goal: Perform Home Exercise Program - Progress: Progressing toward goal  PT Treatment Precautions/Restrictions  Restrictions Weight Bearing Restrictions: No Other Position/Activity Restrictions: WBAT Mobility (including Balance) Bed Mobility Bed Mobility: Yes Sit to supine: min assist for RLE placed onto bed from Right side. Transfers Transfers: Yes Sit to Stand: With upper extremity assist;From recliner with Supervision Sit to Stand Details (indicate cue type and reason): vc to push from armsrests Stand to Sit: With upper extremity assist;5: Supervision;To bed;To elevated surface Stand to Sit Details: vc to place RLE forward for  comfort Ambulation/Gait Ambulation/Gait: Yes Ambulation/Gait Assistance: 4: Min assist (pt=75) Ambulation/Gait Assistance Details (indicate cue type and reason): pt with increased pain this PM,but able to ambule, not as far Ambulation Distance (Feet): 125 Feet Assistive device: Rolling walker Gait Pattern: Step-through pattern (initially small steps, improved with practice) Gait velocity: increased speed  Posture/Postural Control Posture/Postural Control: No significant limitations Exercise   End of Session PT - End of Session Activity Tolerance: Patient tolerated treatment well Patient left: with call bell in reach;in bed Nurse Communication:  (for medication) General Behavior During Session: [facility] for tasks performed Cognition: [facility] for tasks performed  Rada Hay 05/10/2011, 4:15 PM

## 2011-05-11 ENCOUNTER — Encounter (HOSPITAL_COMMUNITY): Payer: Self-pay | Admitting: Orthopaedic Surgery

## 2011-05-11 LAB — CBC
Hemoglobin: 11.4 g/dL — ABNORMAL LOW (ref 13.0–17.0)
MCH: 31.4 pg (ref 26.0–34.0)
MCV: 92.6 fL (ref 78.0–100.0)
Platelets: 162 10*3/uL (ref 150–400)
RBC: 3.63 MIL/uL — ABNORMAL LOW (ref 4.22–5.81)
WBC: 5.7 10*3/uL (ref 4.0–10.5)

## 2011-05-11 MED ORDER — SODIUM CHLORIDE 0.9 % IJ SOLN: 3.0000 mL | INTRAMUSCULAR | Status: DC | PRN

## 2011-05-11 MED ORDER — SODIUM CHLORIDE 0.9 % IJ SOLN
3.0000 mL | Freq: Two times a day (BID) | INTRAMUSCULAR | Status: DC
  Administered 2011-05-11: 3 mL via INTRAVENOUS

## 2011-05-11 MED ORDER — SODIUM CHLORIDE 0.9 % IV SOLN: 250.0000 mL | INTRAVENOUS | Status: DC | PRN

## 2011-05-11 NOTE — Progress Notes (Signed)
Physical Therapy Treatment Patient Details Name: Lance [name] MRN: [id number] DOB: [birth year] Today's Date: 05/11/2011 1142-1220e 2g PT Assessment/Plan  PT - Assessment/Plan Comments on Treatment Session: Pt plans to D/C to home tommorrow. PT Plan: Discharge plan remains appropriate PT Frequency: 7X/week Recommendations for Other Services: OT consult Follow Up Recommendations: Home health PT Equipment Recommended: 3 in 1 bedside comode PT Goals  Acute Rehab PT Goals PT Goal Formulation: With patient Time For Goal Achievement: 7 days Pt will go Supine/Side to Sit: with supervision PT Goal: Supine/Side to Sit - Progress: Met Pt will go Sit to Supine/Side: with supervision PT Goal: Sit to Supine/Side - Progress: Progressing toward goal Pt will go Sit to Stand: with supervision PT Goal: Sit to Stand - Progress: Met Pt will go Stand to Sit: with supervision PT Goal: Stand to Sit - Progress: Met Pt will Ambulate: >150 feet;with supervision;with rolling walker PT Goal: Ambulate - Progress: Met Pt will Go Up / Down Stairs: 1-2 stairs;with min assist;with least restrictive assistive device PT Goal: Up/Down Stairs - Progress: Met Pt will Perform Home Exercise Program: with supervision, verbal cues required/provided PT Goal: Perform Home Exercise Program - Progress: Progressing toward goal  PT Treatment Precautions/Restrictions  Restrictions Weight Bearing Restrictions: No Other Position/Activity Restrictions: WBAT Mobility (including Balance) Bed Mobility Bed Mobility: Yes Supine to Sit: 5: Supervision Transfers Transfers: Yes Sit to Stand: 5: Supervision;From bed Sit to Stand Details (indicate cue type and reason): vc fror RLE position Stand to Sit: 5: Supervision;To chair/3-in-1 Stand to Sit Details: good safety tech Ambulation/Gait Ambulation/Gait: Yes Ambulation/Gait Assistance: 5: Supervision Ambulation/Gait Assistance Details (indicate cue type and reason): pt reports  feeling better, less pain Ambulation Distance (Feet): 200 Feet Assistive device: Rolling walker Gait Pattern: Step-through pattern Gait velocity: increased speed Exercise  Total Joint Exercises Ankle Circles/Pumps: AROM;Both;10 reps Quad Sets: AROM;Right;10 reps Heel Slides: AAROM;Right;10 reps;Supine Hip ABduction/ADduction: AAROM;Right;10 reps;Supine End of Session PT - End of Session Equipment Utilized During Treatment: Gait belt Activity Tolerance: Patient tolerated treatment well Patient left: in chair;with call bell in reach Nurse Communication: Mobility status for transfers General Behavior During Session: [facility] for tasks performed Cognition: [facility] for tasks performed  Rada Hay 05/11/2011, 5:32 PM

## 2011-05-11 NOTE — Progress Notes (Signed)
Physical Therapy Treatment Patient Details Name: Lance [name] MRN: [id number] DOB: [birth year] Today's Date: 05/11/2011  R Direct Anterior THR POD #3 PM session  15:10 - 15:30  PT Assessment/Plan  PT - Assessment/Plan Comments on Treatment Session: Pt plans to D/C to home tommorrow. PT Plan: Discharge plan remains appropriate Follow Up Recommendations: Home health PT PT Goals  Acute Rehab PT Goals PT Goal Formulation: With patient Pt will go Supine/Side to Sit: with supervision PT Goal: Supine/Side to Sit - Progress: Progressing toward goal Pt will go Sit to Supine/Side: with supervision PT Goal: Sit to Supine/Side - Progress: Progressing toward goal Pt will go Sit to Stand: with supervision PT Goal: Sit to Stand - Progress: Progressing toward goal Pt will go Stand to Sit: with supervision PT Goal: Stand to Sit - Progress: Progressing toward goal Pt will Ambulate: >150 feet;with supervision;with rolling walker PT Goal: Ambulate - Progress: Progressing toward goal Pt will Go Up / Down Stairs: 1-2 stairs;with min assist;with least restrictive assistive device PT Goal: Up/Down Stairs - Progress: Met Pt will Perform Home Exercise Program: with supervision, verbal cues required/provided PT Goal: Perform Home Exercise Program - Progress: Progressing toward goal  PT Treatment Precautions/Restrictions  Restrictions Weight Bearing Restrictions: No Other Position/Activity Restrictions: WBAT Mobility (including Balance) Bed Mobility Bed Mobility: No (Pt OOB in recliner) Transfers Transfers: Yes Sit to Stand: 5: Supervision;From chair/3-in-1 Sit to Stand Details (indicate cue type and reason): increased time 2nd c/o "soreness" in right frontal hip Stand to Sit: 5: Supervision;To chair/3-in-1 Stand to Sit Details: good safety tech Ambulation/Gait Ambulation/Gait: Yes Ambulation/Gait Assistance: 5: Supervision Ambulation/Gait Assistance Details (indicate cue type and reason): pt  states this is his best day yet Ambulation Distance (Feet): 180 Feet Assistive device: Rolling walker Gait Pattern: Step-through pattern Stairs: Yes Stairs Assistance:  (MinGuard Assist and 25% VC's on proper tech and safety) Stair Management Technique: No rails Number of Stairs: 1  Height of Stairs: 6  Wheelchair Mobility Wheelchair Mobility: No    Exercise    End of Session PT - End of Session Equipment Utilized During Treatment: Gait belt Activity Tolerance: Patient tolerated treatment well Patient left: in chair;with call bell in reach General Behavior During Session: [facility] for tasks performed Cognition: [facility] for tasks performed  Felecia Shelling  PTA [facility]  Acute  Rehab Pager     [phone]

## 2011-05-11 NOTE — Progress Notes (Signed)
05/11/2011 Raynelle Bring BSN CCM [phone] Pt admitted with dx severe osteoarthritis rt hip; total hip replacemnt -anterior approach 05/08/11 Pt states he is planning to go back to his home where he will have 24/7 care by partner. Already has RW and will need 3n1. Wants Genevieve Norlander for [facility] services. Pt will require HHpt.  Genevieve Norlander has ordered dme. Anticipate d/c tomorrow

## 2011-05-11 NOTE — Progress Notes (Signed)
Subjective: 3 Days Post-Op Procedure(s) (LRB): TOTAL HIP ARTHROPLASTY ANTERIOR APPROACH (Right) Patient reports pain as moderate.    Objective: Vital signs in last 24 hours: Temp:  [97.9 F (36.6 C)-98.2 F (36.8 C)] 98.2 F (36.8 C) (01/07 0505) Pulse Rate:  [90-101] 90  (01/07 0505) Resp:  [16-18] 16  (01/07 0505) BP: (115-121)/(68-76) 115/68 mmHg (01/07 0505) SpO2:  [96 %-99 %] 99 % (01/07 0505)  Intake/Output from previous day: 01/06 0701 - 01/07 0700 In: 1399 [P.O.:720; I.V.:679] Out: 3051 [Urine:3050; Stool:1] Intake/Output this shift:     Basename 05/11/11 0413 05/10/11 0523 05/09/11 0520  HGB 11.4* 12.1* 13.1    Basename 05/11/11 0413 05/10/11 0523  WBC 5.7 6.2  RBC 3.63* 3.91*  HCT 33.6* 36.2*  PLT 162 158    Basename 05/09/11 0520  NA 134*  K 4.5  CL 100  CO2 27  BUN 13  CREATININE 1.01  GLUCOSE 100*  CALCIUM 9.0   No results found for this basename: LABPT:2,INR:2 in the last 72 hours  Sensation intact distally Intact pulses distally Incision: scant drainage Compartment soft  Assessment/Plan: 3 Days Post-Op Procedure(s) (LRB): TOTAL HIP ARTHROPLASTY ANTERIOR APPROACH (Right) Up with therapy  Harwood Nall Y 05/11/2011, 7:07 AM

## 2011-05-12 MED ORDER — OXYCODONE-ACETAMINOPHEN 5-325 MG PO TABS: 1.0000 | ORAL_TABLET | ORAL | Status: AC | PRN

## 2011-05-12 MED ORDER — METHOCARBAMOL 500 MG PO TABS: 500.0000 mg | ORAL_TABLET | Freq: Four times a day (QID) | ORAL | Status: AC | PRN

## 2011-05-12 MED ORDER — RIVAROXABAN 10 MG PO TABS: 10.0000 mg | ORAL_TABLET | Freq: Every day | ORAL | Status: DC

## 2011-05-12 NOTE — Discharge Summary (Signed)
Physician Discharge Summary  Patient ID: Lance [name] MRN: [id number] DOB/AGE: 69-Dec-1942 69 y.o.  Admit date: 05/08/2011 Discharge date: 05/12/2011  Admission Diagnoses:  Degenerative arthritis of hip  Discharge Diagnoses:  Principal Problem:  *Degenerative arthritis of hip   Past Medical History  Diagnosis Date  . GERD (gastroesophageal reflux disease)   . Arthritis     osteoarthritis    Surgeries: Procedure(s): TOTAL HIP ARTHROPLASTY ANTERIOR APPROACH on 05/08/2011   Consultants (if any):    Discharged Condition: Improved  Hospital Course: Lance [name] is an 71 y.o. male who was admitted 05/08/2011 with a diagnosis of Degenerative arthritis of hip and went to the operating room on 05/08/2011 and underwent the above named procedures.    He was given perioperative antibiotics:  Anti-infectives     Start     Dose/Rate Route Frequency Ordered Stop   05/08/11 1500   ceFAZolin (ANCEF) IVPB 1 g/50 mL premix        1 g 100 mL/hr over 30 Minutes Intravenous 3 times per day 05/08/11 1454 05/09/11 0642   05/08/11 0615   ceFAZolin (ANCEF) IVPB 1 g/50 mL premix        1 g 100 mL/hr over 30 Minutes Intravenous 60 min pre-op 05/08/11 0605 05/08/11 0728        .  He was given sequential compression devices, early ambulation, and chemoprophylaxis for DVT prophylaxis.  He benefited maximally from their hospital stay and there were no complications.    Recent vital signs:  Filed Vitals:   05/11/11 2148  BP: 119/77  Pulse: 97  Temp: 98 F (36.7 C)  Resp: 18    Recent laboratory studies:  Lab Results  Component Value Date   HGB 11.4* 05/11/2011   HGB 12.1* 05/10/2011   HGB 13.1 05/09/2011   Lab Results  Component Value Date   WBC 5.7 05/11/2011   PLT 162 05/11/2011   No results found for this basename: INR   Lab Results  Component Value Date   NA 134* 05/09/2011   K 4.5 05/09/2011   CL 100 05/09/2011   CO2 27 05/09/2011   BUN 13 05/09/2011   CREATININE 1.01 05/09/2011   GLUCOSE 100* 05/09/2011    Discharge Medications:   Current Discharge Medication List    START taking these medications   Details  methocarbamol (ROBAXIN) 500 MG tablet Take 1 tablet (500 mg total) by mouth every 6 (six) hours as needed. Qty: 60 tablet, Refills: 0    oxyCODONE-acetaminophen (ROXICET) 5-325 MG per tablet Take 1 tablet by mouth every 4 (four) hours as needed for pain. Qty: 30 tablet, Refills: 0    rivaroxaban (XARELTO) 10 MG TABS tablet Take 1 tablet (10 mg total) by mouth daily with breakfast. Qty: 20 tablet, Refills: 0      CONTINUE these medications which have NOT CHANGED   Details  aspirin EC 81 MG tablet Take 81 mg by mouth daily after breakfast.     Misc Natural Products (GLUCOSAMINE CHONDROITIN ADV PO) Take 2 tablets by mouth daily.     pravastatin (PRAVACHOL) 20 MG tablet Take 20 mg by mouth at bedtime.         Diagnostic Studies: Dg Hip Complete Right  05/08/2011  *RADIOLOGY REPORT*  Clinical Data: Hip replacement.  RIGHT HIP - COMPLETE 2+ VIEW  Comparison: None.  Findings: We are provided two fluoroscopic intraoperative spot views of the right hip.  Images demonstrate a right total hip replacement device in place.  The device appears located.  No fracture.  IMPRESSION: Right total hip without evidence of complication.  Original Report Authenticated By: Bernadene Bell. Maricela Curet, M.D.   Dg Pelvis Portable  05/08/2011  *RADIOLOGY REPORT*  Clinical Data: Right total hip replacement.  PORTABLE PELVIS  Comparison: None.  Findings: AP view of the pelvis demonstrates that the acetabular and femoral components appear in good position in the AP projection.  There is a slight protrusio deformity of the right acetabulum with sclerosis.  Medial joint space narrowing of the left hip joint.   IMPRESSION: Satisfactory appearance of the right total hip prosthesis in the AP projection.  Original Report Authenticated By: Gwynn Burly, M.D.   Dg Hip Portable 1 View  Right  05/08/2011  *RADIOLOGY REPORT*  Clinical Data: Right total hip prosthesis insertion.  PORTABLE RIGHT HIP - 1 VIEW  Comparison: AP pelvis view dated 05/08/2011  Findings: Femoral component appears in good position with no fracture of the femoral shaft.  Acetabular component is incompletely visualized.  IMPRESSION: Femoral component in good position.  Original Report Authenticated By: Gwynn Burly, M.D.   Dg C-arm 61-120 Min-no Report  05/08/2011  CLINICAL DATA: total hip arthrioplasty anterior approach   C-ARM 61-120 MINUTES  Fluoroscopy was utilized by the requesting physician.  No radiographic  interpretation.      Disposition: Discharge to home       Signed: Kathryne Hitch 05/12/2011, 7:00 AM

## 2011-05-12 NOTE — Progress Notes (Signed)
Pt. stable and ready for discharge s/p right anterior hip replacement. Pain rated at 3 presently. Pain medication given. Pt. given discharge instructions and prescriptions. Pt. Verbalized understanding of discharge instructions. All needed equipment available at home. Pt. D/c'd via wheelchair with family.

## 2012-02-11 ENCOUNTER — Other Ambulatory Visit: Payer: Self-pay | Admitting: Family Medicine

## 2012-02-11 DIAGNOSIS — Z136 Encounter for screening for cardiovascular disorders: Secondary | ICD-10-CM

## 2012-02-17 ENCOUNTER — Ambulatory Visit
Admission: RE | Admit: 2012-02-17 | Discharge: 2012-02-17 | Disposition: A | Discharge status: Home / Self Care | Payer: Medicare Other | Source: Ambulatory Visit | Attending: Family Medicine | Admitting: Family Medicine

## 2012-02-17 DIAGNOSIS — Z136 Encounter for screening for cardiovascular disorders: Secondary | ICD-10-CM

## 2013-10-27 ENCOUNTER — Other Ambulatory Visit: Payer: Self-pay | Admitting: Internal Medicine

## 2013-10-27 DIAGNOSIS — N289 Disorder of kidney and ureter, unspecified: Secondary | ICD-10-CM

## 2013-11-09 ENCOUNTER — Ambulatory Visit
Admission: RE | Admit: 2013-11-09 | Discharge: 2013-11-09 | Disposition: A | Discharge status: Home / Self Care | Payer: Medicare Other | Source: Ambulatory Visit | Attending: Internal Medicine | Admitting: Internal Medicine

## 2013-11-09 DIAGNOSIS — N289 Disorder of kidney and ureter, unspecified: Secondary | ICD-10-CM

## 2013-11-20 ENCOUNTER — Other Ambulatory Visit: Payer: Self-pay | Admitting: Internal Medicine

## 2013-11-20 DIAGNOSIS — R748 Abnormal levels of other serum enzymes: Secondary | ICD-10-CM

## 2013-11-21 ENCOUNTER — Ambulatory Visit
Admission: RE | Admit: 2013-11-21 | Discharge: 2013-11-21 | Disposition: A | Discharge status: Home / Self Care | Payer: Medicare Other | Source: Ambulatory Visit | Attending: Internal Medicine | Admitting: Internal Medicine

## 2013-11-21 DIAGNOSIS — R748 Abnormal levels of other serum enzymes: Secondary | ICD-10-CM

## 2013-12-06 ENCOUNTER — Other Ambulatory Visit: Payer: Self-pay | Admitting: Internal Medicine

## 2013-12-06 DIAGNOSIS — IMO0002 Reserved for concepts with insufficient information to code with codable children: Secondary | ICD-10-CM

## 2013-12-06 DIAGNOSIS — N289 Disorder of kidney and ureter, unspecified: Secondary | ICD-10-CM

## 2013-12-06 DIAGNOSIS — M5430 Sciatica, unspecified side: Secondary | ICD-10-CM

## 2013-12-07 ENCOUNTER — Ambulatory Visit
Admission: RE | Admit: 2013-12-07 | Discharge: 2013-12-07 | Disposition: A | Discharge status: Home / Self Care | Payer: Medicare Other | Source: Ambulatory Visit | Attending: Internal Medicine | Admitting: Internal Medicine

## 2013-12-07 DIAGNOSIS — N289 Disorder of kidney and ureter, unspecified: Secondary | ICD-10-CM

## 2013-12-07 DIAGNOSIS — IMO0002 Reserved for concepts with insufficient information to code with codable children: Secondary | ICD-10-CM

## 2013-12-07 DIAGNOSIS — M5430 Sciatica, unspecified side: Secondary | ICD-10-CM

## 2013-12-20 ENCOUNTER — Other Ambulatory Visit: Payer: Self-pay | Admitting: Neurosurgery

## 2013-12-20 ENCOUNTER — Encounter (HOSPITAL_COMMUNITY): Payer: Self-pay | Admitting: Pharmacy Technician

## 2013-12-21 ENCOUNTER — Encounter (HOSPITAL_COMMUNITY): Payer: Self-pay

## 2013-12-21 ENCOUNTER — Encounter (HOSPITAL_COMMUNITY)
Admission: RE | Admit: 2013-12-21 | Discharge: 2013-12-21 | Disposition: A | Discharge status: Home / Self Care | Payer: Medicare Other | Source: Ambulatory Visit | Attending: Neurosurgery | Admitting: Neurosurgery

## 2013-12-21 HISTORY — DX: Abnormal weight loss: R63.4

## 2013-12-21 HISTORY — DX: Hypothyroidism, unspecified: E03.9

## 2013-12-21 HISTORY — DX: Chronic kidney disease, unspecified: N18.9

## 2013-12-21 HISTORY — DX: Anxiety disorder, unspecified: F41.9

## 2013-12-21 LAB — BASIC METABOLIC PANEL
Anion gap: 13 (ref 5–15)
BUN: 39 mg/dL — AB (ref 6–23)
CALCIUM: 12.6 mg/dL — AB (ref 8.4–10.5)
CO2: 26 mEq/L (ref 19–32)
Chloride: 89 mEq/L — ABNORMAL LOW (ref 96–112)
Creatinine, Ser: 2.42 mg/dL — ABNORMAL HIGH (ref 0.50–1.35)
GFR, EST AFRICAN AMERICAN: 29 mL/min — AB (ref >90)
GFR, EST NON AFRICAN AMERICAN: 25 mL/min — AB (ref >90)
Glucose, Bld: 91 mg/dL (ref 70–99)
POTASSIUM: 5.1 meq/L (ref 3.7–5.3)
SODIUM: 128 meq/L — AB (ref 137–147)

## 2013-12-21 LAB — CBC WITH DIFFERENTIAL/PLATELET
BASOS ABS: 0.1 10*3/uL (ref 0.0–0.1)
BASOS PCT: 1 % (ref 0–1)
Eosinophils Absolute: 0.4 10*3/uL (ref 0.0–0.7)
Eosinophils Relative: 4 % (ref 0–5)
HCT: 40.5 % (ref 39.0–52.0)
Hemoglobin: 14.1 g/dL (ref 13.0–17.0)
LYMPHS ABS: 1.7 10*3/uL (ref 0.7–4.0)
Lymphocytes Relative: 15 % (ref 12–46)
MCH: 29.6 pg (ref 26.0–34.0)
MCHC: 34.8 g/dL (ref 30.0–36.0)
MCV: 84.9 fL (ref 78.0–100.0)
Monocytes Absolute: 1.1 10*3/uL — ABNORMAL HIGH (ref 0.1–1.0)
Monocytes Relative: 10 % (ref 3–12)
NEUTROS PCT: 70 % (ref 43–77)
Neutro Abs: 7.8 10*3/uL — ABNORMAL HIGH (ref 1.7–7.7)
Platelets: 369 10*3/uL (ref 150–400)
RBC: 4.77 MIL/uL (ref 4.22–5.81)
RDW: 14.5 % (ref 11.5–15.5)
WBC: 10.9 10*3/uL — ABNORMAL HIGH (ref 4.0–10.5)

## 2013-12-21 LAB — SURGICAL PCR SCREEN
MRSA, PCR: NEGATIVE
Staphylococcus aureus: POSITIVE — AB

## 2013-12-21 MED ORDER — CEFAZOLIN SODIUM-DEXTROSE 2-3 GM-% IV SOLR
2.0000 g | INTRAVENOUS | Status: AC
  Administered 2013-12-22: 2 g via INTRAVENOUS
  Filled 2013-12-21: qty 50

## 2013-12-21 NOTE — Progress Notes (Signed)
Dr Cyndia BentBadger called for old ekgs,ov notes.

## 2013-12-21 NOTE — Pre-Procedure Instructions (Signed)
Lance [name]  12/21/2013   Your procedure is scheduled on:  12/22/13  [facility]  Call this number if you have problems the morning of surgery: [phone]   Remember:   Do not eat food or drink liquids after midnight.   Take these medicines the morning of surgery with A SIP OF WATER: pepcid,neurontin,synthroid,flomax   Do not wear jewelry, make-up or nail polish.  Do not wear lotions, powders, or perfumes. You may wear deodorant.  Do not shave 48 hours prior to surgery. Men may shave face and neck.  Do not bring valuables to the hospital.  [facility] not responsible                  for any belongings or valuables.               Contacts, dentures or bridgework may not be worn into surgery.  Leave suitcase in the car. After surgery it may be brought to your room.  For patients admitted to the hospital, discharge time is determined by your                treatment team.               Patients discharged the day of surgery will not be allowed to drive  home.  Name and phone number of your driver: family  Special Instructions: Incentive Spirometry - Practice and bring it with you on the day of surgery.   Please read over the following fact sheets that you were given: Pain Booklet, Coughing and Deep Breathing, MRSA Information and Surgical Site Infection Prevention

## 2013-12-21 NOTE — Progress Notes (Signed)
I notified Dr Michelle Piperssey of Creatinine and EKG.  Dr Michelle Piperssey ordered a stat BMET for am and  York SpanielSaid they would check EKG in am.

## 2013-12-22 ENCOUNTER — Encounter (HOSPITAL_COMMUNITY)
Admission: RE | Disposition: A | Discharge status: Rehabilitation Facility | Payer: Self-pay | Source: Ambulatory Visit | Attending: Neurosurgery

## 2013-12-22 ENCOUNTER — Inpatient Hospital Stay (HOSPITAL_COMMUNITY): Payer: Medicare Other | Admitting: Anesthesiology

## 2013-12-22 ENCOUNTER — Encounter (HOSPITAL_COMMUNITY): Payer: Self-pay | Admitting: *Deleted

## 2013-12-22 ENCOUNTER — Inpatient Hospital Stay (HOSPITAL_COMMUNITY)
Admission: RE | Admit: 2013-12-22 | Discharge: 2013-12-27 | DRG: 478 | Disposition: A | Discharge status: Rehabilitation Facility | Payer: Medicare Other | Source: Ambulatory Visit | Attending: Neurosurgery | Admitting: Neurosurgery

## 2013-12-22 ENCOUNTER — Inpatient Hospital Stay (HOSPITAL_COMMUNITY): Payer: Medicare Other

## 2013-12-22 DIAGNOSIS — Z79899 Other long term (current) drug therapy: Secondary | ICD-10-CM | POA: Diagnosis not present

## 2013-12-22 DIAGNOSIS — N179 Acute kidney failure, unspecified: Secondary | ICD-10-CM | POA: Diagnosis not present

## 2013-12-22 DIAGNOSIS — E039 Hypothyroidism, unspecified: Secondary | ICD-10-CM | POA: Diagnosis present

## 2013-12-22 DIAGNOSIS — E859 Amyloidosis, unspecified: Secondary | ICD-10-CM | POA: Diagnosis not present

## 2013-12-22 DIAGNOSIS — R1310 Dysphagia, unspecified: Secondary | ICD-10-CM | POA: Diagnosis present

## 2013-12-22 DIAGNOSIS — E8589 Other amyloidosis: Secondary | ICD-10-CM | POA: Diagnosis present

## 2013-12-22 DIAGNOSIS — N138 Other obstructive and reflux uropathy: Secondary | ICD-10-CM | POA: Diagnosis present

## 2013-12-22 DIAGNOSIS — E440 Moderate protein-calorie malnutrition: Secondary | ICD-10-CM | POA: Insufficient documentation

## 2013-12-22 DIAGNOSIS — R339 Retention of urine, unspecified: Secondary | ICD-10-CM | POA: Diagnosis not present

## 2013-12-22 DIAGNOSIS — N189 Chronic kidney disease, unspecified: Secondary | ICD-10-CM | POA: Diagnosis present

## 2013-12-22 DIAGNOSIS — Z96649 Presence of unspecified artificial hip joint: Secondary | ICD-10-CM

## 2013-12-22 DIAGNOSIS — B370 Candidal stomatitis: Secondary | ICD-10-CM | POA: Diagnosis present

## 2013-12-22 DIAGNOSIS — M1990 Unspecified osteoarthritis, unspecified site: Secondary | ICD-10-CM | POA: Diagnosis present

## 2013-12-22 DIAGNOSIS — K219 Gastro-esophageal reflux disease without esophagitis: Secondary | ICD-10-CM | POA: Diagnosis present

## 2013-12-22 DIAGNOSIS — M4800 Spinal stenosis, site unspecified: Secondary | ICD-10-CM | POA: Diagnosis present

## 2013-12-22 DIAGNOSIS — S32009A Unspecified fracture of unspecified lumbar vertebra, initial encounter for closed fracture: Secondary | ICD-10-CM | POA: Diagnosis present

## 2013-12-22 DIAGNOSIS — N401 Enlarged prostate with lower urinary tract symptoms: Secondary | ICD-10-CM | POA: Diagnosis present

## 2013-12-22 DIAGNOSIS — K5900 Constipation, unspecified: Secondary | ICD-10-CM | POA: Diagnosis present

## 2013-12-22 DIAGNOSIS — X500XXA Overexertion from strenuous movement or load, initial encounter: Secondary | ICD-10-CM | POA: Diagnosis present

## 2013-12-22 DIAGNOSIS — M48061 Spinal stenosis, lumbar region without neurogenic claudication: Secondary | ICD-10-CM | POA: Diagnosis present

## 2013-12-22 DIAGNOSIS — F411 Generalized anxiety disorder: Secondary | ICD-10-CM | POA: Diagnosis present

## 2013-12-22 DIAGNOSIS — IMO0002 Reserved for concepts with insufficient information to code with codable children: Secondary | ICD-10-CM | POA: Diagnosis not present

## 2013-12-22 DIAGNOSIS — M4716 Other spondylosis with myelopathy, lumbar region: Secondary | ICD-10-CM | POA: Diagnosis not present

## 2013-12-22 DIAGNOSIS — I959 Hypotension, unspecified: Secondary | ICD-10-CM | POA: Diagnosis present

## 2013-12-22 HISTORY — PX: LUMBAR LAMINECTOMY/DECOMPRESSION MICRODISCECTOMY: SHX5026

## 2013-12-22 LAB — BASIC METABOLIC PANEL
ANION GAP: 13 (ref 5–15)
BUN: 45 mg/dL — ABNORMAL HIGH (ref 6–23)
CO2: 25 mEq/L (ref 19–32)
Calcium: 12.6 mg/dL — ABNORMAL HIGH (ref 8.4–10.5)
Chloride: 90 mEq/L — ABNORMAL LOW (ref 96–112)
Creatinine, Ser: 2.53 mg/dL — ABNORMAL HIGH (ref 0.50–1.35)
GFR, EST AFRICAN AMERICAN: 28 mL/min — AB (ref >90)
GFR, EST NON AFRICAN AMERICAN: 24 mL/min — AB (ref >90)
GLUCOSE: 87 mg/dL (ref 70–99)
POTASSIUM: 5.4 meq/L — AB (ref 3.7–5.3)
Sodium: 128 mEq/L — ABNORMAL LOW (ref 137–147)

## 2013-12-22 LAB — GLUCOSE, CAPILLARY: Glucose-Capillary: 97 mg/dL (ref 70–99)

## 2013-12-22 SURGERY — LUMBAR LAMINECTOMY/DECOMPRESSION MICRODISCECTOMY 1 LEVEL
Anesthesia: General | Site: Back | Laterality: Right

## 2013-12-22 MED ORDER — SIMVASTATIN 5 MG PO TABS
10.0000 mg | ORAL_TABLET | Freq: Every day | ORAL | Status: DC
  Administered 2013-12-22 – 2013-12-26 (×5): 10 mg via ORAL
  Filled 2013-12-22 (×5): qty 2

## 2013-12-22 MED ORDER — ROCURONIUM BROMIDE 100 MG/10ML IV SOLN
INTRAVENOUS | Status: DC | PRN
  Administered 2013-12-22: 5 mg via INTRAVENOUS
  Administered 2013-12-22: 40 mg via INTRAVENOUS

## 2013-12-22 MED ORDER — ACETAMINOPHEN 325 MG PO TABS: 650.0000 mg | ORAL_TABLET | ORAL | Status: DC | PRN

## 2013-12-22 MED ORDER — SODIUM CHLORIDE 0.9 % IJ SOLN
3.0000 mL | Freq: Two times a day (BID) | INTRAMUSCULAR | Status: DC
  Administered 2013-12-22 – 2013-12-27 (×10): 3 mL via INTRAVENOUS

## 2013-12-22 MED ORDER — SODIUM CHLORIDE 0.9 % IV SOLN: 250.0000 mL | INTRAVENOUS | Status: DC

## 2013-12-22 MED ORDER — PROPOFOL 10 MG/ML IV BOLUS
INTRAVENOUS | Status: DC | PRN
  Administered 2013-12-22: 120 mg via INTRAVENOUS

## 2013-12-22 MED ORDER — HYDROCODONE-ACETAMINOPHEN 5-325 MG PO TABS: 1.0000 | ORAL_TABLET | ORAL | Status: DC | PRN

## 2013-12-22 MED ORDER — THROMBIN 5000 UNITS EX SOLR
CUTANEOUS | Status: DC | PRN
  Administered 2013-12-22 (×2): 5000 [IU] via TOPICAL

## 2013-12-22 MED ORDER — CYCLOBENZAPRINE HCL 10 MG PO TABS
10.0000 mg | ORAL_TABLET | Freq: Three times a day (TID) | ORAL | Status: DC | PRN
  Administered 2013-12-24 – 2013-12-27 (×2): 10 mg via ORAL
  Filled 2013-12-22 (×3): qty 1

## 2013-12-22 MED ORDER — EPHEDRINE SULFATE 50 MG/ML IJ SOLN
INTRAMUSCULAR | Status: AC
  Filled 2013-12-22: qty 1

## 2013-12-22 MED ORDER — GLYCOPYRROLATE 0.2 MG/ML IJ SOLN
INTRAMUSCULAR | Status: AC
  Filled 2013-12-22: qty 2

## 2013-12-22 MED ORDER — FENTANYL CITRATE 0.05 MG/ML IJ SOLN
INTRAMUSCULAR | Status: DC | PRN
  Administered 2013-12-22: 100 ug via INTRAVENOUS
  Administered 2013-12-22: 25 ug via INTRAVENOUS

## 2013-12-22 MED ORDER — THROMBIN 20000 UNITS EX SOLR
CUTANEOUS | Status: DC | PRN
  Administered 2013-12-22: 10:00:00 via TOPICAL

## 2013-12-22 MED ORDER — CEFAZOLIN SODIUM 1-5 GM-% IV SOLN
1.0000 g | Freq: Three times a day (TID) | INTRAVENOUS | Status: AC
  Administered 2013-12-22 – 2013-12-23 (×2): 1 g via INTRAVENOUS
  Filled 2013-12-22 (×2): qty 50

## 2013-12-22 MED ORDER — FAMOTIDINE 10 MG PO TABS
10.0000 mg | ORAL_TABLET | Freq: Every day | ORAL | Status: DC
  Administered 2013-12-23 – 2013-12-27 (×5): 10 mg via ORAL
  Filled 2013-12-22 (×5): qty 1

## 2013-12-22 MED ORDER — ONDANSETRON HCL 4 MG/2ML IJ SOLN
INTRAMUSCULAR | Status: DC | PRN
  Administered 2013-12-22: 4 mg via INTRAVENOUS

## 2013-12-22 MED ORDER — TAMSULOSIN HCL 0.4 MG PO CAPS
0.4000 mg | ORAL_CAPSULE | Freq: Every day | ORAL | Status: DC
  Administered 2013-12-23 – 2013-12-27 (×5): 0.4 mg via ORAL
  Filled 2013-12-22 (×5): qty 1

## 2013-12-22 MED ORDER — ACETAMINOPHEN 650 MG RE SUPP: 650.0000 mg | RECTAL | Status: DC | PRN

## 2013-12-22 MED ORDER — HYDROMORPHONE HCL PF 1 MG/ML IJ SOLN: 0.2500 mg | INTRAMUSCULAR | Status: DC | PRN

## 2013-12-22 MED ORDER — FLEET ENEMA 7-19 GM/118ML RE ENEM: 1.0000 | ENEMA | Freq: Once | RECTAL | Status: AC | PRN

## 2013-12-22 MED ORDER — SODIUM CHLORIDE 0.9 % IR SOLN
Status: DC | PRN
  Administered 2013-12-22: 08:00:00

## 2013-12-22 MED ORDER — SENNA 8.6 MG PO TABS
1.0000 | ORAL_TABLET | Freq: Two times a day (BID) | ORAL | Status: DC
  Administered 2013-12-22 – 2013-12-27 (×10): 8.6 mg via ORAL
  Filled 2013-12-22 (×11): qty 1

## 2013-12-22 MED ORDER — HYDROMORPHONE HCL PF 1 MG/ML IJ SOLN
0.5000 mg | INTRAMUSCULAR | Status: DC | PRN
  Administered 2013-12-22: 1 mg via INTRAVENOUS
  Filled 2013-12-22: qty 1

## 2013-12-22 MED ORDER — OXYCODONE HCL 5 MG PO TABS: 5.0000 mg | ORAL_TABLET | Freq: Once | ORAL | Status: DC | PRN

## 2013-12-22 MED ORDER — GLYCOPYRROLATE 0.2 MG/ML IJ SOLN
INTRAMUSCULAR | Status: DC | PRN
  Administered 2013-12-22: 0.6 mg via INTRAVENOUS

## 2013-12-22 MED ORDER — SODIUM CHLORIDE 0.9 % IJ SOLN
INTRAMUSCULAR | Status: AC
  Filled 2013-12-22: qty 10

## 2013-12-22 MED ORDER — ALUM & MAG HYDROXIDE-SIMETH 200-200-20 MG/5ML PO SUSP: 30.0000 mL | Freq: Four times a day (QID) | ORAL | Status: DC | PRN

## 2013-12-22 MED ORDER — PHENOL 1.4 % MT LIQD: 1.0000 | OROMUCOSAL | Status: DC | PRN

## 2013-12-22 MED ORDER — ONDANSETRON HCL 4 MG/2ML IJ SOLN: 4.0000 mg | Freq: Four times a day (QID) | INTRAMUSCULAR | Status: DC | PRN

## 2013-12-22 MED ORDER — NEOSTIGMINE METHYLSULFATE 10 MG/10ML IV SOLN
INTRAVENOUS | Status: AC
Start: 2013-12-22 — End: 2013-12-22
  Filled 2013-12-22: qty 1

## 2013-12-22 MED ORDER — GABAPENTIN 300 MG PO CAPS
300.0000 mg | ORAL_CAPSULE | Freq: Three times a day (TID) | ORAL | Status: DC
  Administered 2013-12-22 – 2013-12-27 (×15): 300 mg via ORAL
  Filled 2013-12-22 (×3): qty 1
  Filled 2013-12-22 (×2): qty 3
  Filled 2013-12-22 (×3): qty 1
  Filled 2013-12-22 (×2): qty 3
  Filled 2013-12-22 (×6): qty 1

## 2013-12-22 MED ORDER — ROCURONIUM BROMIDE 50 MG/5ML IV SOLN
INTRAVENOUS | Status: AC
  Filled 2013-12-22: qty 1

## 2013-12-22 MED ORDER — ARTIFICIAL TEARS OP OINT
TOPICAL_OINTMENT | OPHTHALMIC | Status: AC
  Filled 2013-12-22: qty 3.5

## 2013-12-22 MED ORDER — OXYCODONE-ACETAMINOPHEN 5-325 MG PO TABS
1.0000 | ORAL_TABLET | ORAL | Status: DC | PRN
  Administered 2013-12-22 – 2013-12-24 (×6): 2 via ORAL
  Filled 2013-12-22 (×6): qty 2

## 2013-12-22 MED ORDER — MENTHOL 3 MG MT LOZG: 1.0000 | LOZENGE | OROMUCOSAL | Status: DC | PRN

## 2013-12-22 MED ORDER — LIDOCAINE HCL (CARDIAC) 20 MG/ML IV SOLN
INTRAVENOUS | Status: DC | PRN
  Administered 2013-12-22: 80 mg via INTRAVENOUS

## 2013-12-22 MED ORDER — HEMOSTATIC AGENTS (NO CHARGE) OPTIME
TOPICAL | Status: DC | PRN
  Administered 2013-12-22: 1 via TOPICAL

## 2013-12-22 MED ORDER — PHENYLEPHRINE 40 MCG/ML (10ML) SYRINGE FOR IV PUSH (FOR BLOOD PRESSURE SUPPORT)
PREFILLED_SYRINGE | INTRAVENOUS | Status: AC
Start: 2013-12-22 — End: 2013-12-22
  Filled 2013-12-22: qty 10

## 2013-12-22 MED ORDER — DEXAMETHASONE SODIUM PHOSPHATE 10 MG/ML IJ SOLN: 10.0000 mg | INTRAMUSCULAR | Status: DC

## 2013-12-22 MED ORDER — MUPIROCIN 2 % EX OINT
TOPICAL_OINTMENT | CUTANEOUS | Status: AC
  Filled 2013-12-22: qty 22

## 2013-12-22 MED ORDER — BISACODYL 10 MG RE SUPP
10.0000 mg | Freq: Every day | RECTAL | Status: DC | PRN
Start: 2013-12-22 — End: 2013-12-27
  Administered 2013-12-26: 10 mg via RECTAL
  Filled 2013-12-22: qty 1

## 2013-12-22 MED ORDER — SODIUM CHLORIDE 0.9 % IV SOLN
INTRAVENOUS | Status: DC | PRN
  Administered 2013-12-22 (×2): via INTRAVENOUS

## 2013-12-22 MED ORDER — PROPOFOL 10 MG/ML IV BOLUS
INTRAVENOUS | Status: AC
  Filled 2013-12-22: qty 20

## 2013-12-22 MED ORDER — LEVOTHYROXINE SODIUM 50 MCG PO TABS
50.0000 ug | ORAL_TABLET | Freq: Every day | ORAL | Status: DC
  Administered 2013-12-23 – 2013-12-27 (×5): 50 ug via ORAL
  Filled 2013-12-22 (×5): qty 1

## 2013-12-22 MED ORDER — DOCUSATE SODIUM 100 MG PO CAPS
100.0000 mg | ORAL_CAPSULE | Freq: Every day | ORAL | Status: DC
  Administered 2013-12-23 – 2013-12-27 (×5): 100 mg via ORAL
  Filled 2013-12-22 (×5): qty 1

## 2013-12-22 MED ORDER — ONDANSETRON HCL 4 MG/2ML IJ SOLN
INTRAMUSCULAR | Status: AC
  Filled 2013-12-22: qty 2

## 2013-12-22 MED ORDER — POLYETHYLENE GLYCOL 3350 17 G PO PACK
17.0000 g | PACK | Freq: Every day | ORAL | Status: DC | PRN
  Administered 2013-12-25 – 2013-12-26 (×2): 17 g via ORAL
  Filled 2013-12-22 (×2): qty 1

## 2013-12-22 MED ORDER — FAMOTIDINE 10 MG PO CHEW: 10.0000 mg | CHEWABLE_TABLET | Freq: Every day | ORAL | Status: DC

## 2013-12-22 MED ORDER — LACTATED RINGERS IV SOLN: INTRAVENOUS | Status: DC | PRN

## 2013-12-22 MED ORDER — 0.9 % SODIUM CHLORIDE (POUR BTL) OPTIME
TOPICAL | Status: DC | PRN
  Administered 2013-12-22: 1000 mL

## 2013-12-22 MED ORDER — ONDANSETRON HCL 4 MG/2ML IJ SOLN
4.0000 mg | INTRAMUSCULAR | Status: DC | PRN
  Administered 2013-12-26: 4 mg via INTRAVENOUS
  Filled 2013-12-22: qty 2

## 2013-12-22 MED ORDER — OXYCODONE HCL 5 MG/5ML PO SOLN: 5.0000 mg | Freq: Once | ORAL | Status: DC | PRN

## 2013-12-22 MED ORDER — MUPIROCIN 2 % EX OINT
1.0000 "application " | TOPICAL_OINTMENT | Freq: Once | CUTANEOUS | Status: AC
  Administered 2013-12-22: 1 via TOPICAL

## 2013-12-22 MED ORDER — NEOSTIGMINE METHYLSULFATE 10 MG/10ML IV SOLN
INTRAVENOUS | Status: DC | PRN
  Administered 2013-12-22: 4 mg via INTRAVENOUS

## 2013-12-22 MED ORDER — SUCCINYLCHOLINE CHLORIDE 20 MG/ML IJ SOLN
INTRAMUSCULAR | Status: AC
  Filled 2013-12-22: qty 1

## 2013-12-22 MED ORDER — FENTANYL CITRATE 0.05 MG/ML IJ SOLN
INTRAMUSCULAR | Status: AC
  Filled 2013-12-22: qty 5

## 2013-12-22 MED ORDER — PHENYLEPHRINE HCL 10 MG/ML IJ SOLN
INTRAMUSCULAR | Status: DC | PRN
  Administered 2013-12-22 (×10): 80 ug via INTRAVENOUS

## 2013-12-22 MED ORDER — SODIUM CHLORIDE 0.9 % IJ SOLN: 3.0000 mL | INTRAMUSCULAR | Status: DC | PRN

## 2013-12-22 SURGICAL SUPPLY — 58 items
BAG DECANTER FOR FLEXI CONT (MISCELLANEOUS) ×3 IMPLANT
BENZOIN TINCTURE PRP APPL 2/3 (GAUZE/BANDAGES/DRESSINGS) ×3 IMPLANT
BLADE SURG ROTATE 9660 (MISCELLANEOUS) IMPLANT
BRUSH SCRUB EZ PLAIN DRY (MISCELLANEOUS) ×3 IMPLANT
BUR CUTTER 7.0 ROUND (BURR) ×3 IMPLANT
CANISTER SUCT 3000ML (MISCELLANEOUS) ×3 IMPLANT
CEMENT BONE KYPHX HV R (Orthopedic Implant) ×3 IMPLANT
CLOSURE WOUND 1/2 X4 (GAUZE/BANDAGES/DRESSINGS) ×1
CONT SPEC 4OZ CLIKSEAL STRL BL (MISCELLANEOUS) ×3 IMPLANT
DECANTER SPIKE VIAL GLASS SM (MISCELLANEOUS) ×3 IMPLANT
DERMABOND ADHESIVE PROPEN (GAUZE/BANDAGES/DRESSINGS) ×2
DERMABOND ADVANCED (GAUZE/BANDAGES/DRESSINGS) ×2
DERMABOND ADVANCED .7 DNX12 (GAUZE/BANDAGES/DRESSINGS) ×1 IMPLANT
DERMABOND ADVANCED .7 DNX6 (GAUZE/BANDAGES/DRESSINGS) ×1 IMPLANT
DRAPE LAPAROTOMY 100X72X124 (DRAPES) ×3 IMPLANT
DRAPE MICROSCOPE LEICA (MISCELLANEOUS) ×3 IMPLANT
DRAPE POUCH INSTRU U-SHP 10X18 (DRAPES) ×3 IMPLANT
DRAPE PROXIMA HALF (DRAPES) IMPLANT
DRAPE SURG 17X23 STRL (DRAPES) ×6 IMPLANT
DRSG OPSITE POSTOP 4X6 (GAUZE/BANDAGES/DRESSINGS) ×3 IMPLANT
DURAPREP 26ML APPLICATOR (WOUND CARE) ×3 IMPLANT
ELECT REM PT RETURN 9FT ADLT (ELECTROSURGICAL) ×3
ELECTRODE REM PT RTRN 9FT ADLT (ELECTROSURGICAL) ×1 IMPLANT
EVACUATOR 1/8 PVC DRAIN (DRAIN) ×3 IMPLANT
GAUZE SPONGE 4X4 12PLY STRL (GAUZE/BANDAGES/DRESSINGS) ×3 IMPLANT
GAUZE SPONGE 4X4 16PLY XRAY LF (GAUZE/BANDAGES/DRESSINGS) IMPLANT
GLOVE BIOGEL PI IND STRL 7.0 (GLOVE) ×1 IMPLANT
GLOVE BIOGEL PI INDICATOR 7.0 (GLOVE) ×2
GLOVE ECLIPSE 9.0 STRL (GLOVE) ×3 IMPLANT
GLOVE EXAM NITRILE LRG STRL (GLOVE) IMPLANT
GLOVE EXAM NITRILE MD LF STRL (GLOVE) IMPLANT
GLOVE EXAM NITRILE XL STR (GLOVE) IMPLANT
GLOVE EXAM NITRILE XS STR PU (GLOVE) IMPLANT
GLOVE SS N UNI LF 7.0 STRL (GLOVE) ×9 IMPLANT
GOWN STRL REUS W/ TWL LRG LVL3 (GOWN DISPOSABLE) IMPLANT
GOWN STRL REUS W/ TWL XL LVL3 (GOWN DISPOSABLE) ×3 IMPLANT
GOWN STRL REUS W/TWL 2XL LVL3 (GOWN DISPOSABLE) IMPLANT
GOWN STRL REUS W/TWL LRG LVL3 (GOWN DISPOSABLE)
GOWN STRL REUS W/TWL XL LVL3 (GOWN DISPOSABLE) ×6
KIT BASIN OR (CUSTOM PROCEDURE TRAY) ×3 IMPLANT
KIT ROOM TURNOVER OR (KITS) ×3 IMPLANT
NEEDLE HYPO 22GX1.5 SAFETY (NEEDLE) ×3 IMPLANT
NEEDLE SPNL 22GX3.5 QUINCKE BK (NEEDLE) ×3 IMPLANT
NS IRRIG 1000ML POUR BTL (IV SOLUTION) ×3 IMPLANT
PACK LAMINECTOMY NEURO (CUSTOM PROCEDURE TRAY) ×3 IMPLANT
PAD ARMBOARD 7.5X6 YLW CONV (MISCELLANEOUS) ×9 IMPLANT
PATTIES SURGICAL 1X1 (DISPOSABLE) ×3 IMPLANT
RUBBERBAND STERILE (MISCELLANEOUS) ×6 IMPLANT
SPONGE SURGIFOAM ABS GEL SZ50 (HEMOSTASIS) ×3 IMPLANT
STRIP CLOSURE SKIN 1/2X4 (GAUZE/BANDAGES/DRESSINGS) ×2 IMPLANT
SUT VIC AB 2-0 CT1 18 (SUTURE) ×3 IMPLANT
SUT VIC AB 3-0 SH 8-18 (SUTURE) ×3 IMPLANT
SYR 20ML ECCENTRIC (SYRINGE) ×3 IMPLANT
TAPE STRIPS DRAPE STRL (GAUZE/BANDAGES/DRESSINGS) ×3 IMPLANT
TOWEL OR 17X24 6PK STRL BLUE (TOWEL DISPOSABLE) ×3 IMPLANT
TOWEL OR 17X26 10 PK STRL BLUE (TOWEL DISPOSABLE) ×3 IMPLANT
TRAY KYPHOPAK 15/3 ONESTEP 1ST (MISCELLANEOUS) ×3 IMPLANT
WATER STERILE IRR 1000ML POUR (IV SOLUTION) ×3 IMPLANT

## 2013-12-22 NOTE — Progress Notes (Signed)
Patient arrived on floor via stretcher from PACU.  Patient arousable and oriented x 4.  Made comfortable in bed and offered food and drink.  Will monitor.  Mariam DollarAnna Fed Ceci,RN

## 2013-12-22 NOTE — Transfer of Care (Signed)
Immediate Anesthesia Transfer of Care Note  Patient: Lance [name]  Procedure(s) Performed: Procedure(s) with comments: Right L/2-3LUMBAR DECOMPRESSION 1 LEVEL  (Right) - Right L/2-3LUMBAR DECOMPRESSION 1 LEVEL  Patient Location: PACU  Anesthesia Type:General  Level of Consciousness: awake, alert  and oriented  Airway & Oxygen Therapy: Patient Spontanous Breathing and Patient connected to face mask oxygen  Post-op Assessment: Report given to PACU RN  Post vital signs: Reviewed and stable  Complications: No apparent anesthesia complications

## 2013-12-22 NOTE — Anesthesia Postprocedure Evaluation (Signed)
Anesthesia Post Note  Patient: Lance [name]  Procedure(s) Performed: Procedure(s) (LRB): Right L/2-3LUMBAR DECOMPRESSION 1 LEVEL  (Right)  Anesthesia type: General  Patient location: PACU  Post pain: Pain level controlled and Adequate analgesia  Post assessment: Post-op Vital signs reviewed, Patient's Cardiovascular Status Stable, Respiratory Function Stable, Patent Airway and Pain level controlled  Last Vitals:  Filed Vitals:   12/22/13 1130  BP: 113/68  Pulse: 89  Temp:   Resp: 19    Post vital signs: Reviewed and stable  Level of consciousness: awake, alert  and oriented  Complications: No apparent anesthesia complications

## 2013-12-22 NOTE — Progress Notes (Signed)
Postop check.  Patient currently resting comfortably. No current right lower extremity pain but he did report severe radiating pain when transferring from bed to bed. Motor and sensory examination are stable. Dressing is dry.  Plan to try to mobilize over the weekend. If patient severe radicular pain persists the patient will likely require L2-L4 fusion and stabilization.

## 2013-12-22 NOTE — Progress Notes (Signed)
Pt began dropping sats, not able to take a deep breath when asked, Rokashi CRNA and DR Hodierne to bedside and nasal airway inserted

## 2013-12-22 NOTE — Op Note (Signed)
Date of procedure: 12/22/2013  Date of dictation: Same  Service: Neurosurgery  Preoperative diagnosis: L3 compression fracture stenosis and radiculopathy. Possible pathologic fracture  Postoperative diagnosis: Same  Procedure Name: L2-3 decompressive laminotomy with L2 and L3 decompressive foraminotomies. Open biopsy of vertebral fracture/tumor  Surgeon:Zamariyah Furukawa A.Brayn Eckstein, M.D.  Asst. Surgeon: Wynetta Emeryram  Anesthesia: General  Indication: [age] year old male with severe back and right lower chamois rates her pain consistent with a right L3 radiculopathy. Workup demonstrates evidence of an L3 compression fracture with no obvious neoplastic disease. Patient would no history of primary carcinoma. Patient has significant lateral assistant assisted L2-3 on the right and foraminal stenosis of L3 on the right. He presents now for decompression and percutaneous vertebroplasty.  Operative note: After induction anesthesia, patient positioned prone on the Wilson frame and appropriately padded. Patient's lumbar region prepped and draped sterilely. Incision made overlying the L2-3. Subperiosteal dissection performed on the right side. Retractor placed. X-ray taken. Level confirmed. Decompressive laminotomies then performed using high-speed drill and Kerrison rongeurs to remove the inferior aspect of lamina of L2 medial aspect the L2-3 facet joint and the superior one half of the L3 lamina. Ligament flavum was elevated and resected so fashion. Underlying thecal sac and exiting L3 nerve root were identified. Decompression proceeded along the L2 nerve root above as well. Tracking out the L3 nerve root using the microscope for microdissection there was a great deal of inflammatory tissue and possibly some neoplastic tissue compressing the L3 nerve root as it entered the foramen. Microscope was used for microdissection of the spinal canal. Thecal sac and L3 nerve root were mobilized and the bone of L3 was very soft and possibly  infiltrated by tumor. This was resected and I entered the vertebral body which was extremely soft. Given the degree of posterior cortical disruption I did not think the vertebroplasty was a good option for his fracture at this point and I aborted that part of the procedure. I completed the decompressive foraminotomy along the course the L3 nerve root. I sent specimen for pathology. Results are pending. Wounds that area that lights solution. Gelfoam was placed topically for hemostasis. Wounds and close in layers with Vicryl sutures. Steri-Strips and sterile dressing were applied. There were no apparent complications. Patient tolerated the procedure well and he returns to the recovery room postop. Postoperative plan is to slowly mobilize the patient and see how he does with regard to back pain and radicular pain. Should he have persistent or worsening pain then I would consider lumbar fusion from L2-L4.

## 2013-12-22 NOTE — Anesthesia Preprocedure Evaluation (Addendum)
Anesthesia Evaluation  Patient identified by MRN, date of birth, ID band Patient awake    Reviewed: Allergy & Precautions, H&P , NPO status , Patient's Chart, lab work & pertinent test results  Airway Mallampati: II  Neck ROM: full    Dental   Pulmonary neg pulmonary ROS,          Cardiovascular negative cardio ROS      Neuro/Psych Anxiety    GI/Hepatic GERD-  ,  Endo/Other  Hypothyroidism   Renal/GU Renal InsufficiencyRenal diseaseCr has been steadily increasing over the last year.  Cr was 2.91 on 12/05/2013.  Today it is 2.53.       Musculoskeletal  (+) Arthritis -,   Abdominal   Peds  Hematology   Anesthesia Other Findings   Reproductive/Obstetrics                        Anesthesia Physical Anesthesia Plan  ASA: II  Anesthesia Plan: General   Post-op Pain Management:    Induction: Intravenous  Airway Management Planned: Oral ETT  Additional Equipment:   Intra-op Plan:   Post-operative Plan: Extubation in OR  Informed Consent: I have reviewed the patients History and Physical, chart, labs and discussed the procedure including the risks, benefits and alternatives for the proposed anesthesia with the patient or authorized representative who has indicated his/her understanding and acceptance.   Dental advisory given  Plan Discussed with: CRNA, Anesthesiologist and Surgeon  Anesthesia Plan Comments:         Anesthesia Quick Evaluation

## 2013-12-22 NOTE — H&P (Signed)
Lance [name] is an 73 y.o. male.   Chief Complaint: Back and right leg pain HPI: 73 year old male with severe lower back pain with radiation to his right anterior thigh consistent with a right L3 radiculopathy. Symptoms been present for 2 months after a lifting injury. Workup demonstrates evidence of a compression fracture of L3 with some resultant stenosis and compression of his L3 nerve root within the lateral recess. Patient's failed conservative management and presents now for right L2-3 decompressive laminotomy and foraminotomies with open kyphoplasty vertebroplasty.  Past Medical History  Diagnosis Date  . GERD (gastroesophageal reflux disease)   . Arthritis     osteoarthritis  . Hypothyroidism   . Chronic kidney disease     bph,kidney bx,renal insuff.  . Anxiety   . Weight decrease     20lbs    Past Surgical History  Procedure Laterality Date  . Rotator cuff repair  2004    right rotator cuff repair  . 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 Hand surgery    . Renal biopsy      History reviewed. No pertinent family history. Social History:  reports that he has never smoked. He does not have any smokeless tobacco history on file. He reports that he drinks alcohol. He reports that he does not use illicit drugs.  Allergies: No Known Allergies  Medications Prior to Admission  Medication Sig Dispense Refill  . docusate sodium (COLACE) 100 MG capsule Take 100 mg by mouth daily.      . famotidine (PEPCID AC) 10 MG chewable tablet Chew 10 mg by mouth daily.      Marland Kitchen gabapentin (NEURONTIN) 300 MG capsule Take 300 mg by mouth 3 (three) times daily.      Marland Kitchen HYDROcodone-acetaminophen (NORCO/VICODIN) 5-325 MG per tablet Take 1 tablet by mouth every 6 (six) hours as needed for  moderate pain (every 8 hrs).      Marland Kitchen levothyroxine (SYNTHROID) 50 MCG tablet Take 50 mcg by mouth daily.      . Menthol, Topical Analgesic, (BIOFREEZE EX) Apply 1 application topically 3 (three) times daily as needed (for pain).      . pravastatin (PRAVACHOL) 20 MG tablet Take 20 mg by mouth at bedtime.       . tamsulosin (FLOMAX) 0.4 MG CAPS capsule Take 0.4 mg by mouth daily.        Results for orders placed during the hospital encounter of 12/22/13 (from the past 48 hour(s))  BASIC METABOLIC PANEL     Status: Abnormal   Collection Time    12/22/13  6:30 AM      Result Value Ref Range   Sodium 128 (*) 137 - 147 mEq/L   Potassium 5.4 (*) 3.7 - 5.3 mEq/L   Chloride 90 (*) 96 - 112 mEq/L   CO2 25  19 - 32 mEq/L   Glucose, Bld 87  70 - 99 mg/dL   BUN 45 (*) 6 - 23 mg/dL   Creatinine, Ser 2.53 (*) 0.50 - 1.35 mg/dL   Calcium 12.6 (*) 8.4 - 10.5 mg/dL   GFR calc non Af Amer 24 (*) >90 mL/min   GFR calc Af Amer 28 (*) >90 mL/min   Comment: (NOTE)     The eGFR has been calculated using the CKD EPI equation.  This calculation has not been validated in all clinical situations.     eGFR's persistently <90 mL/min signify possible Chronic Kidney     Disease.   Anion gap 13  5 - 15   No results found.  Review of Systems  Constitutional: Positive for weight loss and malaise/fatigue.  HENT: Negative.   Eyes: Negative.   Respiratory: Negative.   Cardiovascular: Negative.   Gastrointestinal: Negative.   Genitourinary: Negative.   Musculoskeletal: Positive for back pain.  Neurological: Positive for focal weakness and weakness.  Endo/Heme/Allergies: Negative.   Psychiatric/Behavioral: Negative.     Blood pressure 121/71, pulse 86, temperature 97.8 F (36.6 C), temperature source Oral, resp. rate 18, weight 62.596 kg (138 lb), SpO2 100.00%. Physical Exam  Constitutional: He is oriented to person, place, and time. He appears well-developed. No distress.  HENT:  Head: Normocephalic and  atraumatic.  Right Ear: External ear normal.  Left Ear: External ear normal.  Nose: Nose normal.  Mouth/Throat: Oropharynx is clear and moist.  Eyes: Conjunctivae and EOM are normal. Pupils are equal, round, and reactive to light. Right eye exhibits no discharge. Left eye exhibits no discharge.  Neck: Normal range of motion. Neck supple. No JVD present. No tracheal deviation present. No thyromegaly present.  Cardiovascular: Normal rate, regular rhythm, normal heart sounds and intact distal pulses.  Exam reveals no friction rub.   No murmur heard. Respiratory: Effort normal and breath sounds normal. No stridor. No respiratory distress. He has no wheezes.  GI: Soft. Bowel sounds are normal. He exhibits no distension. There is no tenderness.  Musculoskeletal: Normal range of motion. He exhibits tenderness. He exhibits no edema.  Neurological: He is alert and oriented to person, place, and time. He has normal reflexes. He displays normal reflexes. No cranial nerve deficit. He exhibits normal muscle tone. Coordination normal.  Skin: Skin is warm and dry. No rash noted. He is not diaphoretic. No erythema. No pallor.  Psychiatric: He has a normal mood and affect. His behavior is normal. Judgment and thought content normal.     Assessment/Plan L3 compression fracture with stenosis. Plan right L2-3 decompressive laminotomy and foraminotomies and open vertebroplasty. risks and benefits been explained. Patient wishes to proceed.  Hosanna Betley A 12/22/2013, 7:38 AM

## 2013-12-22 NOTE — Brief Op Note (Signed)
12/22/2013  9:59 AM  PATIENT:  Lance [name]  73 y.o. male  PRE-OPERATIVE DIAGNOSIS:  L/3 compression fracture  POST-OPERATIVE DIAGNOSIS:  L/3 compression fracture  PROCEDURE:  Procedure(s) with comments: Right L/2-3LUMBAR DECOMPRESSION 1 LEVEL  (Right) - Right L/2-3LUMBAR DECOMPRESSION 1 LEVEL  SURGEON:  Surgeon(s) and Role:    * Temple PaciniHenry A Mykenzi Vanzile, MD - Primary    * Mariam DollarGary P Cram, MD - Assisting  PHYSICIAN ASSISTANT:   ASSISTANTS:    ANESTHESIA:   general  EBL:  Total I/O In: 1000 [I.V.:1000] Out: 150 [Blood:150]  BLOOD ADMINISTERED:none  DRAINS: (Medium) Hemovact drain(s) in the Epidural space with  Suction Open   LOCAL MEDICATIONS USED:  MARCAINE     SPECIMEN:  Source of Specimen:  Open biopsy L3 vertebral body  DISPOSITION OF SPECIMEN:  PATHOLOGY  COUNTS:  YES  TOURNIQUET:  * No tourniquets in log *  DICTATION: .Dragon Dictation  PLAN OF CARE: Admit to inpatient   PATIENT DISPOSITION:  PACU - hemodynamically stable.   Delay start of Pharmacological VTE agent (>24hrs) due to surgical blood loss or risk of bleeding: yes

## 2013-12-23 MED ORDER — NEPRO/CARBSTEADY PO LIQD
237.0000 mL | ORAL | Status: DC
  Administered 2013-12-23 – 2013-12-27 (×5): 237 mL via ORAL

## 2013-12-23 MED ORDER — LIDOCAINE HCL 2 % EX GEL
1.0000 "application " | Freq: Once | CUTANEOUS | Status: AC
  Administered 2013-12-23: 1 via URETHRAL
  Filled 2013-12-23: qty 5

## 2013-12-23 NOTE — Progress Notes (Signed)
PT Evaluation  12/23/13 1100  PT Visit Information  Assistance Needed +1  History of Present Illness 73 yo male s/p L2-3 decompression laminotomy with kyphoplasty vertebroplasty. PMH: RTC Rt, Rt direct anterior THP, hand surgery, weight lose 20 pounds, arthritis, Gerd, anxiety  Precautions  Precautions Back  Precaution Comments back handout provided and reviewed in detail with family present  Home Living  Family/patient expects to be discharged to: Private residence  Living Arrangements Spouse/significant other  Available Help at Discharge Family;Available 24 hours/day  Type of Home House (town home)  Home Access Level entry  Home Layout One level  Home Equipment [place] - single point;Walker - 4 wheels;Hand held shower head;Shower seat - built in  Prior Function  Level of Independence Independent with assistive device(s);Needs assistance  Gait / Transfers Assistance Needed using 4 WW  ADL's / Homemaking Assistance Needed (A) with setup of adls  Communication  Communication No difficulties  Pain Assessment  Pain Assessment 0-10  Pain Score 9  Pain Location  Rt le pain only with mobility    Pain Descriptors / Indicators Aching  Pain Intervention(s) Repositioned  Cognition  Arousal/Alertness Awake/alert  Behavior During Therapy WFL for tasks assessed/performed  Overall Cognitive Status Within Functional Limits for tasks assessed  Upper Extremity Assessment  Upper Extremity Assessment Overall WFL for tasks assessed  Lower Extremity Assessment  Lower Extremity Assessment Defer to PT evaluation  Cervical / Trunk Assessment  Cervical / Trunk Assessment Normal  Bed Mobility  Overal bed mobility Needs Assistance (see OT evaluation)  Bed Mobility Supine to Sit;Sidelying to Sit;Rolling  Rolling Max assist (with pad)  Sidelying to sit Mod assist  Supine to sit Mod assist  General bed mobility comments cues for safety and sequence  Transfers  Overall transfer level Needs assistance   Sit to Stand Mod assist  General transfer comment Assistance to initiate transfer with cues for proper hand and LE placement.   Balance  Overall balance assessment Needs assistance  Sitting-balance support Feet supported;Bilateral upper extremity supported  Sitting balance-Leahy Scale Good  Standing balance support Bilateral upper extremity supported;During functional activity  Standing balance-Leahy Scale Poor  Acute Rehab PT Goals  Patient Stated Goal To return home and improve overall ambulation with decrease pain  Written Expression  Dominant Hand Right    KendletonWendy Kirstyn Lean, PT DPT [phone]

## 2013-12-23 NOTE — Progress Notes (Signed)
Filed Vitals:   12/22/13 2008 12/22/13 2138 12/23/13 0146 12/23/13 0528  BP:  116/71 110/60 101/56  Pulse:  103 117 113  Temp:  99.6 F (37.6 C) 99 F (37.2 C) 98.4 F (36.9 C)  TempSrc:  Oral Oral Oral  Resp:  Height:  (1.727 m)     Weight: 62.5 kg (137 lb 12.6 oz)     SpO2:  97% 94% 94%    CBC  Recent Labs  12/21/13 1513  WBC 10.9*  HGB 14.1  HCT 40.5  PLT 369   BMET  Recent Labs  12/21/13 1513 12/22/13 0630  NA 128* 128*  K 5.1 5.4*  CL 89* 90*  CO2 26 25  GLUCOSE 91 87  BUN 39* 45*  CREATININE 2.42* 2.53*  CALCIUM 12.6* 12.6*   Patient resting in bed, complaining of pain in the right thigh and knee. Patient's had difficulty voiding since Foley DC'd. Required in and out catheterization at 0300, for 1200 cc. Has voided about 50 cc since, and bladder scan now shows greater than 765 cc. I've asked the nursing staff to place a Foley catheter once again. Patient already on Flomax. Patient's ambulation is quite limited, essentially bed to bathroom. Awaiting PT and OT.  Plan: Foley to straight drainage. Await PT and OT. Encouraged ambulation as feasible.  Hewitt Shorts, MD 12/23/2013, 9:20 AM

## 2013-12-23 NOTE — Progress Notes (Signed)
INITIAL NUTRITION ASSESSMENT  DOCUMENTATION CODES Per approved criteria  -Non-severe (moderate) malnutrition in the context of chronic illness  Pt meets criteria for moderate MALNUTRITION in the context of chronic illness as evidenced by 15% body weight loss in 8 months, PO intake < 75% for one month.   INTERVENTION: -Recommend Nepro Shake po Q24H, each supplement provides 425 kcal and 19 grams protein -Reviewed general renal diet nutrition therapy  -Will continue to monitor  NUTRITION DIAGNOSIS: Inadequate oral intake related to decreased appetite/diet restrictions as evidenced by decreased PO intake,  15% body weight loss in 7 months.   Goal: Pt to meet >/= 90% of their estimated nutrition needs    Monitor:  Total protein/energy intake, labs, weights, renal profile, diet education needs  Reason for Assessment: MST  73 y.o. male  Admitting Dx: Closed lumbar vertebral fracture  ASSESSMENT: 73 year old male with severe lower back pain with radiation to his right anterior thigh consistent with a right L3 radiculopathy  -Pt w/L3 compression fx stenosis -Pt endorsed an unintentional wt loss of 25 lbs since 05/2013 d/t hospitalizations, decreased appetite, and renal diet restrictions (15% body weight loss, significant for time frame) -Diet recall indicates pt typically consuming 2 meals/day- cereal w/eggs for breakfast, and a sandwich for lunch; however pt reported significant decreased intake for past several week -Family reported they have been trying to comply with renal diet restriction- will prepare white rice and implement low sodium seasonings -Reviewed additional low potassium and low phosphorus foods- dairy substitutes, grits, and leached potatoes -Was willing to trial Nepro supplement for additional nutrients while maintaining low K and Phos intake. Will add for snack, and encouraged compliance with supplement upon d/c as tolerated   Height: Ht Readings from Last 1  Encounters:  12/22/13 5\' 8"  (1.727 m)    Weight: Wt Readings from Last 1 Encounters:  12/22/13 137 lb 12.6 oz (62.5 kg)    Ideal Body Weight: 154 lbs  % Ideal Body Weight: 89%  Wt Readings from Last 10 Encounters:  12/22/13 137 lb 12.6 oz (62.5 kg)  12/22/13 137 lb 12.6 oz (62.5 kg)  12/21/13 138 lb 9.6 oz (62.869 kg)  05/08/11 163 lb (73.936 kg)  05/08/11 163 lb (73.936 kg)  05/01/11 163 lb (73.936 kg)    Usual Body Weight: 163-165 lbs  % Usual Body Weight: 85%  BMI:  Body mass index is 20.96 kg/(m^2).  Estimated Nutritional Needs: Kcal: 1800-2000 Protein: 65-80 gram Fluid: >/= 1800 ml/daily  Skin: WDl- surgical incision  Diet Order: Renal  EDUCATION NEEDS: -Education needs addressed   Intake/Output Summary (Last 24 hours) at 12/23/13 1224 Last data filed at 12/23/13 0100  Gross per 24 hour  Intake    960 ml  Output   1400 ml  Net   -440 ml    Last BM: pta   Labs:   Recent Labs Lab 12/21/13 1513 12/22/13 0630  NA 128* 128*  K 5.1 5.4*  CL 89* 90*  CO2 26 25  BUN 39* 45*  CREATININE 2.42* 2.53*  CALCIUM 12.6* 12.6*  GLUCOSE 91 87    CBG (last 3)   Recent Labs  12/22/13 1829  GLUCAP 97    Scheduled Meds: . docusate sodium  100 mg Oral Daily  . famotidine  10 mg Oral Daily  . feeding supplement (NEPRO CARB STEADY)  237 mL Oral Q24H  . gabapentin  300 mg Oral TID  . levothyroxine  50 mcg Oral QAC breakfast  . senna  1 tablet Oral BID  . simvastatin  10 mg Oral q1800  . sodium chloride  3 mL Intravenous Q12H  . tamsulosin  0.4 mg Oral Daily    Continuous Infusions: . sodium chloride      Past Medical History  Diagnosis Date  . GERD (gastroesophageal reflux disease)   . Arthritis     osteoarthritis  . Hypothyroidism   . Chronic kidney disease     bph,kidney bx,renal insuff.  . Anxiety   . Weight decrease     20lbs    Past Surgical History  Procedure Laterality Date  . Rotator cuff repair  2004    right rotator cuff  repair  . 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 Hand surgery    . Renal biopsy    . Laminectomy      L2 L3  WITH OPEN KYPHOPLASTY   Lloyd HugerSarah F Tynesha Free MS RD LDN Clinical Dietitian Pager:[phone]

## 2013-12-23 NOTE — Progress Notes (Signed)
Pt attempted to urinate again 0600, 50ml out, along with blood noted in urine and spot on gown/sheets. No complaints of pain. Will continue to monitor.

## 2013-12-23 NOTE — Progress Notes (Signed)
Pt states he has been peeing but not very much. Pt attempted to urinate again, not very successful, bladder scan shows >93899ml. In and out cath drained 1200ml, pt states he feels much better. Post void scan shows 0ml. Will continue to monitor.

## 2013-12-23 NOTE — Progress Notes (Signed)
Called by RN to insert coude catheter. Noted order to insert indwelling coude catheter for urinary retention. Using lidocaine jelly, inserted 5516fr coude catheter without difficulty. Blood tinged urine noted flowing into bag at first, followed by 600cc yellow urine. Pt tolerated well. Mick SellShannon Lacreasha Hinds RN

## 2013-12-23 NOTE — Evaluation (Signed)
Occupational Therapy Evaluation Patient Details Name: Lance [name] MRN: [id number] DOB: 08-29-1940 Today's Date: 12/23/2013    History of Present Illness 73 yo male s/p L2-3 decompression laminotomy with kyphoplasty vertebroplasty. PMH: RTC Rt, Rt direct anterior THP, hand surgery, weight lose 20 pounds, arthritis, Gerd, anxiety   Clinical Impression   Patient is s/p L2-3 decompression laminotomy with kyphoplasty surgery resulting in functional limitations due to the deficits listed below (see OT problem list). PTA pt was progressively becoming weaker over 2 week time frame. Pt was independent no DME 2 weeks ago. Pt has used 4WW with rest breaks due to increasing pain levels. Patient will benefit from skilled OT acutely to increase independence and safety with ADLS to allow discharge CIR. Ot to follow acutely for strengthening and adl retraining with back precautions. Pt should be admitted to CIR to reach MOD I level with adls again with pain management     Follow Up Recommendations  CIR    Equipment Recommendations  Other (comment) (defer)    Recommendations for Other Services Rehab consult     Precautions / Restrictions Precautions Precautions: Back Precaution Comments: back handout provided and reviewed in detail with family present      Mobility Bed Mobility Overal bed mobility: Needs Assistance (see OT evaluation) Bed Mobility: Supine to Sit;Sidelying to Sit;Rolling Rolling: Max assist (with pad) Sidelying to sit: Mod assist Supine to sit: Mod assist     General bed mobility comments: cues for safety and sequence  Transfers Overall transfer level: Needs assistance   Transfers: Sit to/from Stand Sit to Stand: Mod assist         General transfer comment: Assistance to initiate transfer with cues for proper hand and LE placement.     Balance Overall balance assessment: Needs assistance Sitting-balance support: Feet supported;Bilateral upper extremity  supported Sitting balance-Leahy Scale: Good     Standing balance support: Bilateral upper extremity supported;During functional activity Standing balance-Leahy Scale: Poor Standing balance comment: Pt needs occasional moderate assistance to maintain upright posture and prevent right LE knee buckling.  Pt tends to go into increased flexion with overall fatigue.                             ADL Overall ADL's : Needs assistance/impaired Eating/Feeding: Modified independent;Sitting   Grooming: Oral care;Modified independent;Sitting   Upper Body Bathing: Moderate assistance;Sitting   Lower Body Bathing: Maximal assistance;Sit to/from stand       Lower Body Dressing: Maximal assistance;Sit to/from stand Lower Body Dressing Details (indicate cue type and reason): pt don mesh underwear with pad to prevent incontinence with sit<>Stand             Functional mobility during ADLs: Moderate assistance;Rolling walker General ADL Comments: Pt required (A) to complete bed mobility , sit<>Stand and ambulation in the RW. Pt with very flexed posture in RW and needs constant cues for elongation of spine. Pt requires (A) for all LB adls at this time. Pt currenlty with bloody urine output. pt provided male urinal due to opening is easier for patient to use.     Vision                     Perception     Praxis      Pertinent Vitals/Pain Pain Assessment: 0-10 Pain Score: 9  Pain Location:   Rt le pain only with mobility   Pain Descriptors / Indicators: Aching Pain  Intervention(s): Repositioned     Hand Dominance Right   Extremity/Trunk Assessment Upper Extremity Assessment Upper Extremity Assessment: Overall WFL for tasks assessed   Lower Extremity Assessment Lower Extremity Assessment: Defer to PT evaluation   Cervical / Trunk Assessment Cervical / Trunk Assessment: Normal   Communication Communication Communication: No difficulties   Cognition  Arousal/Alertness: Awake/alert Behavior During Therapy: WFL for tasks assessed/performed Overall Cognitive Status: Within Functional Limits for tasks assessed                     General Comments       Exercises       Shoulder Instructions      Home Living Family/patient expects to be discharged to:: Private residence Living Arrangements: Spouse/significant other Available Help at Discharge: Family;Available 24 hours/day Type of Home: House (town home) Home Access: Level entry     Home Layout: One level     Bathroom Shower/Tub: Walk-in shower;Door   Bathroom Toilet: Handicapped height     Home Equipment: Cane - single point;Walker - 4 wheels;Hand held shower head;Shower seat - built in          Prior Functioning/Environment Level of Independence: Independent with assistive device(s);Needs assistance  Gait / Transfers Assistance Needed: using 4 [employer] ADL's / Homemaking Assistance Needed: (A) with setup of adls        OT Diagnosis: Generalized weakness;Acute pain   OT Problem List: Decreased strength;Decreased activity tolerance;Impaired balance (sitting and/or standing);Decreased safety awareness;Decreased knowledge of use of DME or AE;Decreased knowledge of precautions;Pain   OT Treatment/Interventions: Self-care/ADL training;Therapeutic exercise;DME and/or AE instruction;Therapeutic activities;Patient/family education;Balance training    OT Goals(Current goals can be found in the care plan section) Acute Rehab OT Goals Patient Stated Goal: To return home and improve overall ambulation with decrease pain OT Goal Formulation: With patient/family Time For Goal Achievement: 01/06/14 Potential to Achieve Goals: Good  OT Frequency: Min 2X/week   Barriers to D/C:            Co-evaluation              End of Session Equipment Utilized During Treatment: Gait belt;Rolling walker Nurse Communication: Mobility status;Precautions  Activity Tolerance:  Patient limited by pain Patient left: in chair;with family/visitor present;with call bell/phone within reach   Time: 1610-9604 OT Time Calculation (min): 35 min Charges:  OT General Charges $OT Visit: 1 Procedure OT Evaluation $Initial OT Evaluation Tier I: 1 Procedure OT Treatments $Self Care/Home Management : 8-22 mins G-Codes:    Harolyn Rutherford 12/28/13, 11:26 AM Pager: [phone]

## 2013-12-24 MED ORDER — PANTOPRAZOLE SODIUM 40 MG IV SOLR
40.0000 mg | Freq: Two times a day (BID) | INTRAVENOUS | Status: DC
  Administered 2013-12-24 – 2013-12-25 (×3): 40 mg via INTRAVENOUS
  Filled 2013-12-24 (×3): qty 40

## 2013-12-24 MED ORDER — DEXAMETHASONE SODIUM PHOSPHATE 4 MG/ML IJ SOLN
8.0000 mg | Freq: Four times a day (QID) | INTRAMUSCULAR | Status: AC
  Administered 2013-12-24 – 2013-12-25 (×4): 8 mg via INTRAVENOUS
  Filled 2013-12-24 (×5): qty 2

## 2013-12-24 MED ORDER — HYDROCODONE-ACETAMINOPHEN 5-325 MG PO TABS
1.0000 | ORAL_TABLET | ORAL | Status: DC | PRN
  Administered 2013-12-24 – 2013-12-27 (×4): 1 via ORAL
  Filled 2013-12-24 (×4): qty 1

## 2013-12-24 NOTE — Progress Notes (Signed)
Physical Therapy Treatment Patient Details Name: Lance [name] MRN: [id number] DOB: 1940-11-14 Today's Date: 12/24/2013    History of Present Illness 73 yo male s/p L2-3 decompression laminotomy with kyphoplasty vertebroplasty. PMH: RTC Rt, Rt direct anterior THP, hand surgery, weight lose 20 pounds, arthritis, Gerd, anxiety    PT Comments    Pt progressing slowly, continues to have severe back and right leg pain with mobility that is limiting his advancement. Ambulated 30' with RW and min A. Has difficulty with transitional mvmts. Encouragement given to change positions frequently through day. PT will continue to follow.   Follow Up Recommendations  CIR;Supervision/Assistance - 24 hour     Equipment Recommendations  None recommended by PT    Recommendations for Other Services Rehab consult     Precautions / Restrictions Precautions Precautions: Back Precaution Booklet Issued: No Precaution Comments: pt could remember that he had precautions but not what any of them are, all reviewed Restrictions Weight Bearing Restrictions: No    Mobility  Bed Mobility               General bed mobility comments: pt received sitting in chair  Transfers Overall transfer level: Needs assistance Equipment used: Rolling walker (2 wheeled) Transfers: Sit to/from Stand Sit to Stand: Min assist         General transfer comment: pt practiced sit to stand multiple times with improvement each time. Increased time needed to achieve full upright posture. vc's for safety with sitting as pt becomes impulsive when close to sitting and wants to leave RW, reach for chair, and sit as quickly as possible  Ambulation/Gait Ambulation/Gait assistance: Min assist Ambulation Distance (Feet): 30 Feet Assistive device: Rolling walker (2 wheeled) Gait Pattern/deviations: Step-through pattern;Trunk flexed Gait velocity: decreased   General Gait Details: frequent vc's for posture and gaze  direction, pt could correct short term. Distance limited by pain. Decreased WB'ing RLE and difficulty getting right heel to floor   Stairs            Wheelchair Mobility    Modified Rankin (Stroke Patients Only)       Balance Overall balance assessment: Needs assistance         Standing balance support: Single extremity supported;During functional activity Standing balance-Leahy Scale: Poor Standing balance comment: no knee buckling in standing today but bilateral knees maintained with increased flexion                    Cognition Arousal/Alertness: Awake/alert Behavior During Therapy: WFL for tasks assessed/performed Overall Cognitive Status: Within Functional Limits for tasks assessed       Memory: Decreased recall of precautions              Exercises General Exercises - Lower Extremity Ankle Circles/Pumps: AROM;Both;20 reps;Seated    General Comments        Pertinent Vitals/Pain Pain Assessment: 0-10 Pain Score: 10-Worst pain ever Pain Location: back and right leg with ambulation (4/10 before ambulation) Pain Intervention(s): Monitored during session;Limited activity within patient's tolerance    Home Living                      Prior Function            PT Goals (current goals can now be found in the care plan section) Acute Rehab PT Goals Patient Stated Goal: To return home and improve overall ambulation with decrease pain PT Goal Formulation: With patient/family Time For Goal Achievement: 01/06/14 Potential  to Achieve Goals: Good Progress towards PT goals: Progressing toward goals    Frequency  Min 5X/week    PT Plan Current plan remains appropriate    Co-evaluation             End of Session Equipment Utilized During Treatment: Gait belt Activity Tolerance: Patient limited by pain Patient left: in chair;with call bell/phone within reach;with family/visitor present     Time: 8295-6213 PT Time  Calculation (min): 20 min  Charges:  $Gait Training: 8-22 mins                    G Codes:     Lyanne Co, PT  Acute Rehab Services  [phone]  Lyanne Co 12/24/2013, 11:44 AM

## 2013-12-24 NOTE — Progress Notes (Signed)
Patient ID: Lance [name], male   DOB: 04-29-1941, 73 y.o.   MRN: [id number] Having a lot of right L3 radiculopathy but stable from yesterday  Feels like he is oversedated possibly from the medications.  Strength is 5 out of 5 wound is clean and dry  Bullet his pain medication make some adjustments start him on Decadron

## 2013-12-24 NOTE — Progress Notes (Signed)
Rehab Admissions Coordinator Note:  Patient was screened by Clois Dupes for appropriateness for an Inpatient Acute Rehab Consult per PT and OT recommendation.  At this time, we are recommending Inpatient Rehab consult. Please place order.  Clois Dupes 12/24/2013, 11:58 AM  I can be reached at [phone].

## 2013-12-25 ENCOUNTER — Encounter (HOSPITAL_COMMUNITY): Payer: Self-pay | Admitting: Neurosurgery

## 2013-12-25 DIAGNOSIS — E440 Moderate protein-calorie malnutrition: Secondary | ICD-10-CM | POA: Insufficient documentation

## 2013-12-25 LAB — BASIC METABOLIC PANEL
Anion gap: 17 — ABNORMAL HIGH (ref 5–15)
BUN: 69 mg/dL — ABNORMAL HIGH (ref 6–23)
CO2: 19 mEq/L (ref 19–32)
Calcium: 10.8 mg/dL — ABNORMAL HIGH (ref 8.4–10.5)
Chloride: 93 mEq/L — ABNORMAL LOW (ref 96–112)
Creatinine, Ser: 2.84 mg/dL — ABNORMAL HIGH (ref 0.50–1.35)
GFR calc Af Amer: 24 mL/min — ABNORMAL LOW (ref >90)
GFR, EST NON AFRICAN AMERICAN: 21 mL/min — AB (ref >90)
GLUCOSE: 163 mg/dL — AB (ref 70–99)
POTASSIUM: 5.2 meq/L (ref 3.7–5.3)
Sodium: 129 mEq/L — ABNORMAL LOW (ref 137–147)

## 2013-12-25 MED ORDER — SODIUM CHLORIDE 0.9 % IV SOLN: Freq: Once | INTRAVENOUS | Status: DC

## 2013-12-25 MED ORDER — PANTOPRAZOLE SODIUM 40 MG PO TBEC
40.0000 mg | DELAYED_RELEASE_TABLET | Freq: Two times a day (BID) | ORAL | Status: DC
  Administered 2013-12-25 – 2013-12-27 (×4): 40 mg via ORAL
  Filled 2013-12-25 (×4): qty 1

## 2013-12-25 NOTE — Care Management Note (Addendum)
  Page 1 of 1   12/25/2013     8:03:08 PM CARE MANAGEMENT NOTE 12/25/2013  Patient:  Lance [name], Lance [name]   Account Number:  [id number]  Date Initiated:  12/25/2013  Documentation initiated by:  Elmer Bales  Subjective/Objective Assessment:   Patient was admitted for lumbar decompression. Lives at home with spouse.     Action/Plan:   Will follow for discharge needs pending PT/OT evals and physician orders.   Anticipated DC Date:     Anticipated DC Plan:  IP REHAB FACILITY  In-house referral  Clinical Social Worker         Choice offered to / List presented to:             Status of service:  In process, will continue to follow Medicare Important Message given?  YES (If response is "NO", the following Medicare IM given date fields will be blank) Date Medicare IM given:  12/25/2013 Medicare IM given by:  Elmer Bales Date Additional Medicare IM given:   Additional Medicare IM given by:    Discharge Disposition:    Per UR Regulation:  Reviewed for med. necessity/level of care/duration of stay  If discussed at Long Length of Stay Meetings, dates discussed:    Comments:  12/25/13 1155 Elmer Bales RN, MSN, CM- Medicare IM letter provided.

## 2013-12-25 NOTE — Progress Notes (Signed)
Overall doing somewhat better today. Less leg pain. Able to sit in a chair reasonably comfortably. No left-sided symptoms. Lower back pain controlled.  Afebrile. Mild hypotension without any symptoms of orthostasis. Urine output good. Drain has been removed. Motor and sensory function stable. No pain with straight leg raising.  Somewhat improved. We will add lumbar corset and continue efforts at mobilization. May be able to let him go home tomorrow or the next day.

## 2013-12-25 NOTE — Progress Notes (Signed)
Physical Therapy Treatment Patient Details Name: Lance [name] MRN: [id number] DOB: [birth year] Today's Date: 12/25/2013    History of Present Illness 73 yo male s/p L2-3 decompression laminotomy with kyphoplasty vertebroplasty. PMH: RTC Rt, Rt direct anterior THP, hand surgery, weight lose 20 pounds, arthritis, Gerd, anxiety    PT Comments    Pt progressing with ambulation distance, ambulated 50' today with RW and min A, but continuing to experience 10/10 back and right leg pain as well as dysfunctional gait. Becoming more independent with bed mobility. PT will continue to follow.   Follow Up Recommendations  CIR;Supervision/Assistance - 24 hour     Equipment Recommendations  None recommended by PT    Recommendations for Other Services Rehab consult     Precautions / Restrictions Precautions Precautions: Back Precaution Comments: reviewed Required Braces or Orthoses: Spinal Brace Spinal Brace: Lumbar corset;Applied in sitting position Restrictions Weight Bearing Restrictions: No    Mobility  Bed Mobility Overal bed mobility: Needs Assistance Bed Mobility: Supine to Sit;Sit to Supine     Supine to sit: Supervision Sit to supine: Min assist   General bed mobility comments: vc's for supine to sit for pt to get legs further off bed before sitting up, pt kept precautions and did not require physical assist. Min A to LE's for return to bed  Transfers Overall transfer level: Needs assistance Equipment used: Rolling walker (2 wheeled) Transfers: Sit to/from Stand Sit to Stand: Supervision;Min assist         General transfer comment: first sit to stand with min A, pt with posterior lean and had to sit back down. Second sit to stand, supervision but no physical assist, seemed to help pt's proprioception to have no help, better with wt shift fwd. vc's for hand placement both times.  Ambulation/Gait Ambulation/Gait assistance: Min assist Ambulation Distance (Feet): 50  Feet Assistive device: Rolling walker (2 wheeled) Gait Pattern/deviations: Trunk flexed;Step-to pattern;Decreased weight shift to right Gait velocity: decreased   General Gait Details: bilateral knees flexed despite vc's, pt still on ball of right foot with step to pattern up to it. He reports he cannot even out stride due to pain. vc's for erect posture.   Stairs            Wheelchair Mobility    Modified Rankin (Stroke Patients Only)       Balance Overall balance assessment: Needs assistance Sitting-balance support: Feet supported;Single extremity supported Sitting balance-Leahy Scale: Fair Sitting balance - Comments: sat EOB for donning of brace, min A needed because pt unable to accept perturbation in sitting   Standing balance support: Bilateral upper extremity supported;During functional activity Standing balance-Leahy Scale: Poor Standing balance comment: min A to maintain standing with UE support                    Cognition Arousal/Alertness: Awake/alert Behavior During Therapy: WFL for tasks assessed/performed Overall Cognitive Status: Within Functional Limits for tasks assessed       Memory: Decreased recall of precautions;Decreased short-term memory              Exercises      General Comments        Pertinent Vitals/Pain Pain Assessment: 0-10 Pain Score: 10-Worst pain ever Pain Location: back in sitting and ambulation, RLE with ambulation Pain Intervention(s): Monitored during session    Home Living                      Prior Function  PT Goals (current goals can now be found in the care plan section) Acute Rehab PT Goals Patient Stated Goal: To return home and improve overall ambulation with decrease pain PT Goal Formulation: With patient/family Time For Goal Achievement: 01/06/14 Potential to Achieve Goals: Good Progress towards PT goals: Progressing toward goals    Frequency  Min 5X/week    PT  Plan Current plan remains appropriate    Co-evaluation             End of Session Equipment Utilized During Treatment: Gait belt;Back brace Activity Tolerance: Patient limited by pain Patient left: in bed;with bed alarm set;with call bell/phone within reach     Time: 1420-1438 PT Time Calculation (min): 18 min  Charges:  $Gait Training: 8-22 mins                    G Codes:     Lyanne Co, PT  Acute Rehab Services  [phone]  [place], [place] 12/25/2013, 2:40 PM

## 2013-12-25 NOTE — Progress Notes (Signed)
Occupational Therapy Treatment Patient Details Name: Lance [name] MRN: [id number] DOB: [birth year] Today's Date: 12/25/2013    History of present illness 73 yo male s/p L2-3 decompression laminotomy with kyphoplasty vertebroplasty. PMH: RTC Rt, Rt direct anterior THP, hand surgery, weight lose 20 pounds, arthritis, Gerd, anxiety   OT comments  Pt continues to be limited by pain 10 out 10 in Rt LE. Spouse very concerned with cognitive deficit changes this AM. Pt very limited adls due to pain and requires sitting position. EXCELLENT CIR candidate.   Follow Up Recommendations  CIR    Equipment Recommendations       Recommendations for Other Services Rehab consult    Precautions / Restrictions Precautions Precautions: Back Precaution Comments: reviewed precautions Required Braces or Orthoses: Spinal Brace Spinal Brace: Lumbar corset;Applied in sitting position Restrictions Weight Bearing Restrictions: No       Mobility Bed Mobility Overal bed mobility: Needs Assistance Bed Mobility: Supine to Sit     Supine to sit: Mod assist Sit to supine: Min assist   General bed mobility comments: v/c to complete task and (A) to push up with Rt UE  Transfers Overall transfer level: Needs assistance Equipment used: Rolling walker (2 wheeled) Transfers: Sit to/from Stand Sit to Stand: Min assist         General transfer comment: cues for hand placement    Balance Overall balance assessment: Needs assistance Sitting-balance support: Feet supported;Single extremity supported Sitting balance-Leahy Scale: Fair Sitting balance - Comments: sat EOB for donning of brace, min A needed because pt unable to accept perturbation in sitting   Standing balance support: Bilateral upper extremity supported;During functional activity Standing balance-Leahy Scale: Poor Standing balance comment: min A to maintain standing with UE support                   ADL Overall ADL's : Needs  assistance/impaired     Grooming: Minimal assistance;Sitting   Upper Body Bathing: Minimal assitance;Sitting   Lower Body Bathing: Moderate assistance;Sit to/from stand           Toilet Transfer: Minimal assistance;Ambulation;RW           Functional mobility during ADLs: Moderate assistance;Rolling walker (constant cues for upright posture- pain incr to 10 out 10 ) General ADL Comments: Pt remains with extreme pain limiting all progressing toward goals and new onset of cognitive changes. Pt remains excellent CIR candidate. pt with bloody urine in foley. pt with decr control of bladder function. Pt reports bladder changes happen 4 days prior to admission.      Vision                     Perception     Praxis      Cognition   Behavior During Therapy: Restless Overall Cognitive Status: Impaired/Different from baseline Area of Impairment: Orientation;Safety/judgement;Problem solving Orientation Level: Disoriented to;Time   Memory: Decreased short-term memory    Safety/Judgement: Decreased awareness of deficits   Problem Solving: Slow processing General Comments: Pt with deficits completing sponge bath in sitting. pt with cognitive deficits not present on evaluation. Spouse very concerned and asking "what is causing this change?" Pt self reports with extended questioning  deficits    Extremity/Trunk Assessment               Exercises     Shoulder Instructions       General Comments      Pertinent Vitals/ Pain  Pain Assessment: 0-10 Pain Score: 10-Worst pain ever Pain Location: Rt leg Pain Descriptors / Indicators: Constant;Jabbing Pain Intervention(s): Repositioned  Home Living                                          Prior Functioning/Environment              Frequency Min 2X/week     Progress Toward Goals  OT Goals(current goals can now be found in the care plan section)  Progress towards OT goals: Not  progressing toward goals - comment (pain and cognition limiting )  Acute Rehab OT Goals Patient Stated Goal: To return home and improve overall ambulation with decrease pain OT Goal Formulation: With patient/family Time For Goal Achievement: 01/06/14 Potential to Achieve Goals: Good ADL Goals Pt Will Perform Grooming: with modified independence;standing Pt Will Perform Upper Body Bathing: with supervision;sitting Pt Will Perform Lower Body Bathing: with supervision;sit to/from stand;with adaptive equipment Pt Will Transfer to Toilet: with min guard assist;bedside commode Additional ADL Goal #1: Pt will complete bed mobility min (A) with out bed rails and hob less 20 degrees  Plan Discharge plan remains appropriate    Co-evaluation                 End of Session Equipment Utilized During Treatment: Gait belt;Rolling walker   Activity Tolerance Patient limited by pain   Patient Left in chair;with call bell/phone within reach;with family/visitor present   Nurse Communication Mobility status;Precautions        Time: 1610-9604 OT Time Calculation (min): 28 min  Charges: OT General Charges $OT Visit: 1 Procedure OT Treatments $Self Care/Home Management : 23-37 mins  Harolyn Rutherford 12/25/2013, 3:48 PM Pager: [phone]

## 2013-12-25 NOTE — Clinical Social Work Placement (Addendum)
Clinical Social Work Department CLINICAL SOCIAL WORK PLACEMENT NOTE 12/25/2013  Patient:  MALEK, SKOG  Account Number:  [id number] Admit date:  12/22/2013  Clinical Social Worker:  Mosie Epstein  Date/time:  12/25/2013 06:48 PM  Clinical Social Work is seeking post-discharge placement for this patient at the following level of care:   SKILLED NURSING   (*CSW will update this form in Epic as items are completed)   12/25/2013  Patient/family provided with [facility] Department of Clinical Social Work's list of facilities offering this level of care within the geographic area requested by the patient (or if unable, by the patient's family).  12/25/2013  Patient/family informed of their freedom to choose among providers that offer the needed level of care, that participate in Medicare, Medicaid or managed care program needed by the patient, have an available bed and are willing to accept the patient.  12/25/2013  Patient/family informed of [facility]' ownership interest in [facility], as well as of the fact that they are under no obligation to receive care at this facility.  PASARR submitted to EDS on 12/25/2013 PASARR number received on 12/25/2013  FL2 transmitted to all facilities in geographic area requested by pt/family on  12/25/2013 FL2 transmitted to all facilities within larger geographic area on   Patient informed that his/her managed care company has contracts with or will negotiate with  certain facilities, including the following:     Patient/family informed of bed offers received:  12/26/2013 Patient chooses bed at  Physician recommends and patient chooses bed at    Patient to be transferred to  on   Patient to be transferred to facility by  Patient and family notified of transfer on  Name of family member notified:    The following physician request were entered in Epic:   Additional Comments:  Marcelline Deist, MSW, [facility] Licensed Clinical  Social Worker [phone] and [phone]

## 2013-12-25 NOTE — Clinical Social Work Psychosocial (Signed)
Clinical Social Work Department BRIEF PSYCHOSOCIAL ASSESSMENT 12/25/2013  Patient:  Lance [name]     Account Number:  [id number]     Admit date:  12/22/2013  Clinical Social Worker:  VAUGHN,EMILY S, LCSWA  Date/Time:  12/25/2013 03:18 PM  Referred by:  Physician  Date Referred:  12/25/2013 Referred for  SNF Placement   Other Referral:   CIR, RNCM for home health services.   Interview type:  Patient Other interview type:   Pt's significant other, Lance, also present at bedside.    PSYCHOSOCIAL DATA Living Status:  SIGNIFICANT OTHER Admitted from facility:   Level of care:   Primary support name:  Lance [name] Primary support relationship to patient:  PARTNER Degree of support available:   Strong support system.  cell: [phone]  home: [phone]    CURRENT CONCERNS Current Concerns  Post-Acute Placement   Other Concerns:   none.    SOCIAL WORK ASSESSMENT / PLAN CSW consulted for possible SNF placement at time of discharge. CSW met with pt and pt's partner at bedside to discuss SNF as an alternative plan to CIR. Pt and pt's partner agreeable to SNF search as an alternative plan to CIR (preferably [facility]). Pt's partner expressed possible interest in pt returning home if CIR denied.    Pt's partner stated he is hopeful for CIR then for pt to return home once medically stable. CSW provided support. CSW to continue to follow and assist with discharge planning needs.   Assessment/plan status:  Psychosocial Support/Ongoing Assessment of Needs Other assessment/ plan:   none.   Information/referral to community resources:   [place] SNF search, RNCM for possible home health services.    PATIENT'S/FAMILY'S RESPONSE TO PLAN OF CARE: Pt and pt's partner understanding and agreeable to CSW plan of care. Pt and pt's partner expressed no further questions or concerns at this time.       Emily Vaughn, MSW, LCSWA Licensed Clinical Social Worker [id number] and  [id number] [phone] 

## 2013-12-25 NOTE — Progress Notes (Signed)
OT NOTE  Pt very confused this AM compared to baseline.pt unaware of day/ month or time of day. Pt's spouse very concerned pt reporting "he thinks he ate dinner and he had eggs for breakfast."  Pt reports to therapist: "I am not thinking clearly. Something isn't just right"  RN made aware of AMS state currently. Pt with cognitive deficits with sponge bath sitting.   Mateo Flow   OTR/L Pager: [phone] Office: [phone] .

## 2013-12-25 NOTE — Progress Notes (Addendum)
Sent text page to physician on call to notify of BP 88/50 (60). Will continue to monitor accordingly.   545- Orders received from [facility] MD for 500 cc bolus. Will give 250 ml bolus because of renal function to see if BP can be accommodated.

## 2013-12-26 ENCOUNTER — Encounter: Payer: Self-pay | Admitting: *Deleted

## 2013-12-26 DIAGNOSIS — IMO0002 Reserved for concepts with insufficient information to code with codable children: Secondary | ICD-10-CM

## 2013-12-26 DIAGNOSIS — W240XXA Contact with lifting devices, not elsewhere classified, initial encounter: Secondary | ICD-10-CM

## 2013-12-26 DIAGNOSIS — S32009A Unspecified fracture of unspecified lumbar vertebra, initial encounter for closed fracture: Secondary | ICD-10-CM

## 2013-12-26 LAB — PROTEIN ELECTROPHORESIS, SERUM
Albumin ELP: 51.1 % — ABNORMAL LOW (ref 55.8–66.1)
Alpha-1-Globulin: 9.5 % — ABNORMAL HIGH (ref 2.9–4.9)
Alpha-2-Globulin: 16.5 % — ABNORMAL HIGH (ref 7.1–11.8)
BETA GLOBULIN: 6.2 % (ref 4.7–7.2)
Beta 2: 6.4 % (ref 3.2–6.5)
GAMMA GLOBULIN: 10.3 % — AB (ref 11.1–18.8)
M-SPIKE, %: NOT DETECTED g/dL
TOTAL PROTEIN ELP: 5.7 g/dL — AB (ref 6.0–8.3)

## 2013-12-26 NOTE — Progress Notes (Signed)
I met with pt at bedside and then spoke with his spouse, Lance [name], by phone. Both are in agreement to admission to inpt rehab when medically ready. I will follow up tomorrow with Dr. Annette Stable for possible admit tomorrow. Lance [name] concerned with his confusion and his dosage of steroids which he has discussed with Dr. Annette Stable.  [phone]

## 2013-12-26 NOTE — Consult Note (Signed)
Physical Medicine and Rehabilitation Consult  Reason for Consult: L3 compression fracture with radiculopathy.  Referring Physician: Dr. Jordan Likes   HPI: Lance [name] is a 73 y.o. male with history of CKD, anxiety disorder, recent 20 lbs weight loss,  Lifting injury 2 months ago with subsequent L3 compression Fx with stenosis and L3 radiculopathy. He has failed conservative treatment and elected to undergo L2/3 decompressive laminectomy with foraminotomy and open kyphoplasty vertebroplasty by Dr. Jordan Likes on 12/22/13. Bone biopsy negative for tumor and patient with recent dx of renal amyloidosis.  Post op has had issues with urinary retention, acute on chronic renal failure as well as confusion. Therapy ongoing but patient limited by pain, poor posture as well as deconditioning. MD, PT, OT recommending CIR for progression.    Review of Systems  HENT: Negative for hearing loss.   Eyes: Positive for blurred vision.  Respiratory: Negative for cough and shortness of breath.   Cardiovascular: Negative for chest pain and palpitations.  Gastrointestinal: Positive for constipation (no BM since surgery). Negative for heartburn, nausea and abdominal pain.  Musculoskeletal: Positive for back pain and myalgias.  Neurological: Positive for sensory change and focal weakness. Negative for dizziness and headaches.  Psychiatric/Behavioral: The patient is nervous/anxious.      Past Medical History  Diagnosis Date  . GERD (gastroesophageal reflux disease)   . Arthritis     osteoarthritis  . Hypothyroidism   . Chronic kidney disease     bph,kidney bx,renal insuff.  . Anxiety   . Weight decrease     20lbs    Past Surgical History  Procedure Laterality Date  . Rotator cuff repair  2004    right rotator cuff repair  . Total hip arthroplasty  05/08/2011    Procedure: TOTAL HIP ARTHROPLASTY ANTERIOR APPROACH;  Surgeon: Kathryne Hitch;  Location: [facility];  Service: Orthopedics;  Laterality:  Right;  Right Total Hip Arthroplasty, Direct Anterior Approach (C-Arm)  . Hand surgery    . Renal biopsy    . Laminectomy      L2 L3  WITH OPEN KYPHOPLASTY  . Lumbar laminectomy/decompression microdiscectomy Right 12/22/2013    Procedure: Right L/2-3LUMBAR DECOMPRESSION 1 LEVEL ;  Surgeon: Temple Pacini, MD;  Location: [facility];  Service: Neurosurgery;  Laterality: Right;  Right L/2-3LUMBAR DECOMPRESSION 1 LEVEL    History reviewed. No pertinent family history.  Social History:  Lives with supportive partner. Retired from [employer] (worked in [employer]) he reports that he has never smoked. He has never used smokeless tobacco. He reports that he drinks alcohol. He reports that he does not use illicit drugs.   Allergies: No Known Allergies   Medications Prior to Admission  Medication Sig Dispense Refill  . docusate sodium (COLACE) 100 MG capsule Take 100 mg by mouth daily.      . famotidine (PEPCID AC) 10 MG chewable tablet Chew 10 mg by mouth daily.      Marland Kitchen gabapentin (NEURONTIN) 300 MG capsule Take 300 mg by mouth 3 (three) times daily.      Marland Kitchen HYDROcodone-acetaminophen (NORCO/VICODIN) 5-325 MG per tablet Take 1 tablet by mouth every 6 (six) hours as needed for moderate pain (every 8 hrs).      Marland Kitchen levothyroxine (SYNTHROID) 50 MCG tablet Take 50 mcg by mouth daily.      . Menthol, Topical Analgesic, (BIOFREEZE EX) Apply 1 application topically 3 (three) times daily as needed (for pain).      Marland Kitchen  pravastatin (PRAVACHOL) 20 MG tablet Take 20 mg by mouth at bedtime.       . tamsulosin (FLOMAX) 0.4 MG CAPS capsule Take 0.4 mg by mouth daily.        Home: Home Living Family/patient expects to be discharged to:: Private residence Living Arrangements: Spouse/significant other Available Help at Discharge: Family;Available 24 hours/day Type of Home: House (town home) Home Access: Level entry Home Layout: One level Home Equipment: Cane - single point;Walker - 4 wheels;Hand held  shower head;Shower seat - built in  Functional History: Prior Function Level of Independence: Independent with assistive device(s);Needs assistance Gait / Transfers Assistance Needed: using 4 [employer] ADL's / Homemaking Assistance Needed: (A) with setup of adls Functional Status:  Mobility: Bed Mobility Overal bed mobility: Needs Assistance Bed Mobility: Supine to Sit;Sidelying to Sit;Rolling Rolling: Max assist (cues to sequence) Sidelying to sit: Mod assist Supine to sit: Mod assist Sit to supine: Min assist General bed mobility comments: Pt requires max cueing for sequence and maintain back precautions. Pt with poor return demo of log roll. Transfers Overall transfer level: Needs assistance Equipment used: Rolling walker (2 wheeled) Transfers: Sit to/from Stand Sit to Stand: Mod assist General transfer comment: cues for hand placement. pt with immediate LOB posterior with static standing. pt needed proprioception input at toes to help pt weight shift forward.  Ambulation/Gait Ambulation/Gait assistance: Min assist Ambulation Distance (Feet): 50 Feet Assistive device: Rolling walker (2 wheeled) Gait Pattern/deviations: Trunk flexed;Step-to pattern;Decreased weight shift to right Gait velocity: decreased Gait velocity interpretation: <1.8 ft/sec, indicative of risk for recurrent falls General Gait Details: bilateral knees flexed despite vc's, pt still on ball of right foot with step to pattern up to it. He reports he cannot even out stride due to pain. vc's for erect posture.    ADL: ADL Overall ADL's : Needs assistance/impaired Eating/Feeding: Modified independent;Sitting Grooming: Maximal assistance;Oral care;Cueing for safety;Cueing for sequencing;Standing Grooming Details (indicate cue type and reason): Pt with knee flexion and forward flexion at hips. pt needed constant cues at shoulder for upright posture and therapist helping support (A) sink level. pt requires max (A) to  static stand for oral care. Pt unable to complete oral care completely due to fatigue and decr static standing. pt returned to sitting in chair then provided mouth wash Upper Body Bathing: Minimal assitance;Sitting Lower Body Bathing: Moderate assistance;Sit to/from stand Upper Body Dressing : Total assistance;Sitting Upper Body Dressing Details (indicate cue type and reason): pt total (A) to don back brace. pt with posterior lean and posterior pelvic tilt. Pt unable to sustain static sitting EOB.  Lower Body Dressing: Maximal assistance;Sit to/from stand Lower Body Dressing Details (indicate cue type and reason): pt don mesh underwear with pad to prevent incontinence with sit<>Stand Toilet Transfer: Moderate assistance;BSC;RW Functional mobility during ADLs: Moderate assistance;Rolling walker General ADL Comments: Pt remains with balance deficits, forward flexion at hips, severe pain in Rt LE and slow progress toward goals. Called CIR coordinator regarding recommendation. Pt s spouse very concered with confusion level and need for further workup in [place] for blood work. Spouse provided paper and pen to document all questions and information to help advocate/ organzie information being provided.  Cognition: Cognition Overall Cognitive Status: Impaired/Different from baseline Orientation Level: Oriented to person;Oriented to place;Oriented to situation (forgetful at times) Cognition Arousal/Alertness: Awake/alert Behavior During Therapy: Flat affect Overall Cognitive Status: Impaired/Different from baseline Area of Impairment: Orientation;Safety/judgement;Problem solving Orientation Level: Disoriented to;Time Memory: Decreased short-term memory Safety/Judgement: Decreased awareness of deficits  Problem Solving: Slow processing General Comments: Pt rolling blanket in hands and very restless. pt asked "do you need help?" pt stops looks at therapist and states "I dont know what I am doing.  Was I folding it?"  Blood pressure 135/78, pulse 112, temperature 97.9 F (36.6 C), temperature source Oral, resp. rate 18, height  (1.727 m), weight 62.5 kg (137 lb 12.6 oz), SpO2 96.00%. Physical Exam  Nursing note and vitals reviewed. Constitutional: He is oriented to person, place, and time. He appears well-developed and well-nourished.  HENT:  Head: Normocephalic and atraumatic.  Eyes: Conjunctivae are normal. Pupils are equal, round, and reactive to light.  Neck: Normal range of motion. Neck supple.  Cardiovascular: Normal rate and regular rhythm.   Respiratory: Effort normal and breath sounds normal. No respiratory distress. He has no wheezes.  GI: He exhibits distension. Bowel sounds are increased. There is no tenderness.  Neurological: He is alert and oriented to person, place, and time.  Speech clear. Follows commands without difficulty. UES: RUE: deltoid 5/5, bicep 5/5, tricep 5/5, wrist ext 5/5, hand intrinsics 5/5 LUE: deltoid 5/5, bicep 5/5, triceps 5/5, wrist ext 5/5, hand instrincs 5/5 RLE: Hip 2/5 pain, Isolate KE 2/5. HAD 3/5. Ankle 4+/5 LLE: Hip 3+, KE 4/5, ankle 4/5.  Sensory changes over the anterior-lateral thigh to right knee. DTR's 1+  Skin: Skin is warm and dry.  Psychiatric: He has a normal mood and affect. His behavior is normal. Judgment and thought content normal.    Results for orders placed during the hospital encounter of 12/22/13 (from the past 24 hour(s))  BASIC METABOLIC PANEL     Status: Abnormal   Collection Time    12/25/13  1:47 PM      Result Value Ref Range   Sodium 129 (*) 137 - 147 mEq/L   Potassium 5.2  3.7 - 5.3 mEq/L   Chloride 93 (*) 96 - 112 mEq/L   CO2 19  19 - 32 mEq/L   Glucose, Bld 163 (*) 70 - 99 mg/dL   BUN 69 (*) 6 - 23 mg/dL   Creatinine, Ser 4.09 (*) 0.50 - 1.35 mg/dL   Calcium 81.1 (*) 8.4 - 10.5 mg/dL   GFR calc non Af Amer 21 (*) >90 mL/min   GFR calc Af Amer 24 (*) >90 mL/min   Anion gap 17 (*) 5 - 15   No  results found.  Assessment/Plan: Diagnosis: Rigth L3 radic, L3 compression fx s/p decompression/kyphoplasty 1. Does the need for close, 24 hr/day medical supervision in concert with the patient's rehab needs make it unreasonable for this patient to be served in a less intensive setting? Yes 2. Co-Morbidities requiring supervision/potential complications: pain control, oa hips 3. Due to bladder management, bowel management, safety, skin/wound care, disease management, medication administration, pain management and patient education, does the patient require 24 hr/day rehab nursing? Yes 4. Does the patient require coordinated care of a physician, rehab nurse, PT (1-2 hrs/day, 5 days/week) and OT (1-2 hrs/day, 5 days/week) to address physical and functional deficits in the context of the above medical diagnosis(es)? Yes Addressing deficits in the following areas: balance, endurance, locomotion, strength, transferring, bowel/bladder control, bathing, dressing, feeding, grooming, toileting and psychosocial support 5. Can the patient actively participate in an intensive therapy program of at least 3 hrs of therapy per day at least 5 days per week? Yes 6. The potential for patient to make measurable gains while on inpatient rehab is excellent 7. Anticipated functional outcomes  upon discharge from inpatient rehab are modified independent  with PT, modified independent with OT, n/a with SLP. 8. Estimated rehab length of stay to reach the above functional goals is: 9-13 days 9. Does the patient have adequate social supports to accommodate these discharge functional goals? Yes 10. Anticipated D/C setting: Home 11. Anticipated post D/C treatments: HH therapy 12. Overall Rehab/Functional Prognosis: excellent  RECOMMENDATIONS: This patient's condition is appropriate for continued rehabilitative care in the following setting: CIR Patient has agreed to participate in recommended program. Yes Note that insurance  prior authorization may be required for reimbursement for recommended care.  Comment: Rehab Admissions Coordinator to follow up.  Thanks,  Ranelle Oyster, MD, [place] [facility]     12/26/2013

## 2013-12-26 NOTE — Progress Notes (Signed)
Physical Therapy Treatment Patient Details Name: Lance [name] MRN: [id number] DOB: 1940-10-12 Today's Date: 12/26/2013    History of Present Illness 73 yo male s/p L2-3 decompression laminotomy with kyphoplasty vertebroplasty. PMH: RTC Rt, Rt direct anterior THP, hand surgery, weight lose 20 pounds, arthritis, Gerd, anxiety    PT Comments    Pt making steady progress toward goals.  Follow Up Recommendations  CIR;Supervision/Assistance - 24 hour     Equipment Recommendations  None recommended by PT    Recommendations for Other Services Rehab consult     Precautions / Restrictions Precautions Precautions: Back Precaution Comments: reviewed precautions Required Braces or Orthoses: Spinal Brace Spinal Brace: Lumbar corset;Applied in sitting position Restrictions Weight Bearing Restrictions: No    Mobility  Bed Mobility Overal bed mobility: Needs Assistance     Sidelying to sit: Mod assist       General bed mobility comments: mod cues for proper sequence for side to sit edge of bed. pt wanting to keep right hand under right leg due to pain,max cues to use that hand with bed mobiltiy.  Transfers Overall transfer level: Needs assistance Equipment used: Rolling walker (2 wheeled) Transfers: Sit to/from Stand Sit to Stand: Min assist         General transfer comment: cues for hand placement and ant weight shift with standing. no posterior LOB with stand from bed after cues were provided.  Ambulation/Gait Ambulation/Gait assistance: Min assist Ambulation Distance (Feet): 20 Feet Assistive device: Rolling walker (2 wheeled) Gait Pattern/deviations: Trunk flexed;Narrow base of support;Step-to pattern;Antalgic;Decreased stance time - right;Decreased weight shift to right Gait velocity: decreased Gait velocity interpretation: Below normal speed for age/gender General Gait Details: bilateral knees flexed despite vc's. cues on posture, walker position and for heel  strike with intital contact with gait.   Stairs            Wheelchair Mobility    Modified Rankin (Stroke Patients Only)          Cognition Arousal/Alertness: Awake/alert Behavior During Therapy: Flat affect Overall Cognitive Status: Impaired/Different from baseline Area of Impairment: Safety/judgement;Problem solving     Memory: Decreased recall of precautions;Decreased short-term memory   Safety/Judgement: Decreased awareness of deficits   Problem Solving: Slow processing             Pertinent Vitals/Pain Pain Assessment: 0-10 Pain Score: 10-Worst pain ever Pain Location: rt leg Pain Descriptors / Indicators: Constant;Jabbing;Throbbing Pain Intervention(s): Monitored during session;Premedicated before session;Repositioned           PT Goals (current goals can now be found in the care plan section) Acute Rehab PT Goals Patient Stated Goal: To return home and improve overall ambulation with decrease pain PT Goal Formulation: With patient/family Time For Goal Achievement: 01/06/14 Potential to Achieve Goals: Good Progress towards PT goals: Progressing toward goals    Frequency  Min 5X/week    PT Plan Current plan remains appropriate       End of Session Equipment Utilized During Treatment: Gait belt;Back brace Activity Tolerance: Other (comment);Patient limited by pain;Patient tolerated treatment well (limited by need for bathroom) Patient left: Other (comment);with nursing/sitter in room;with call bell/phone within reach (left in bathroom with NT in room)     Time: 1027-2536 PT Time Calculation (min): 23 min  Charges:  $Gait Training: 8-22 mins $Therapeutic Activity: 8-22 mins                    G Codes:      Sallyanne Kuster  12/26/2013, 3:51 PM  Sallyanne Kuster, PTA Office- [phone]

## 2013-12-26 NOTE — Progress Notes (Signed)
PT hemavac and foley removed as ordered by MD. Will continue to monitor pt quietly. Dionne Bucy RN

## 2013-12-26 NOTE — Clinical Social Work Note (Signed)
CSW met with pt's significant other, [name], at bedside to discuss SNF bed offers. Pt's significant other informed CSW pt will not be discharged to SNF "due to pt's new diagnosis." Discharge disposition currently deferred. CSW to continue to follow pt to assist with new discharge disposition.  Lubertha Sayres, MSW, [facility] Licensed Clinical Social Worker [phone] and [phone]

## 2013-12-26 NOTE — Progress Notes (Signed)
Per Dr. Earmon Phoenix request to f/u with pt within 2-3 weeks.  Scheduled pt on 01/18/14 at 3:30pm.

## 2013-12-26 NOTE — Progress Notes (Signed)
Overall stable. Patient's pain remains significantly improved from preop but still very limiting with regard to possible discharge home. Given the patient's level of deconditioning, muscle wasting and weakness from radiculopathy I think he lower part either inpatient rehabilitation or skilled nursing facility placement prior to discharge home.  He is afebrile. His vitals are stable. Drain output is minimal. He is awake and alert. Oriented and appropriate. His motor and sensory function are stable. His wound is clean dry. Chest and abdomen benign.  Pathology of his L3 fractured vertebra is negative for tumor.  Pathology done at an outside institution for his renal biopsy is consistent with amyloidosis.  With regard to the patient's new diagnosis of renal amyloidosis I discussed situation with the patient's nephrologist and consults and are hematology oncology group. Plan is for him to be followed as an outpatient for this. No acute treatment or further workup is indicated as the patient's L3 vertebral body biopsy will serve as his bone marrow biopsy.  Continue efforts at mobilization. Remove catheter. Consults to inpatient rehabilitation with regard to possible inpatient rehabilitation admission.

## 2013-12-26 NOTE — Progress Notes (Signed)
Occupational Therapy Treatment Patient Details Name: Lance [name] MRN: [id number] DOB: 08-02-1940 Today's Date: 12/26/2013    History of present illness 73 yo male s/p L2-3 decompression laminotomy with kyphoplasty vertebroplasty. PMH: RTC Rt, Rt direct anterior THP, hand surgery, weight lose 20 pounds, arthritis, Gerd, anxiety   OT comments  Pt remains excellent CIR candidate. Pt currently with cognitive and balance deficits. Pt progressing slowly toward adl goals. Pts spouse very concerned with d/c SNF vs CIR. Spouse prefer CIR admission.   Follow Up Recommendations  CIR    Equipment Recommendations  Other (comment)    Recommendations for Other Services Rehab consult    Precautions / Restrictions Precautions Precautions: Back Precaution Comments: reviewed precautions Required Braces or Orthoses: Spinal Brace Spinal Brace: Lumbar corset;Applied in sitting position       Mobility Bed Mobility Overal bed mobility: Needs Assistance Bed Mobility: Supine to Sit;Sidelying to Sit;Rolling Rolling: Max assist (cues to sequence) Sidelying to sit: Mod assist Supine to sit: Mod assist     General bed mobility comments: Pt requires max cueing for sequence and maintain back precautions. Pt with poor return demo of log roll.  Transfers Overall transfer level: Needs assistance Equipment used: Rolling walker (2 wheeled) Transfers: Sit to/from Stand Sit to Stand: Mod assist         General transfer comment: cues for hand placement. pt with immediate LOB posterior with static standing. pt needed proprioception input at toes to help pt weight shift forward.     Balance Overall balance assessment: Needs assistance Sitting-balance support: Bilateral upper extremity supported;Feet supported Sitting balance-Leahy Scale: Poor Sitting balance - Comments: posterior lean and pelvic tilt Postural control: Posterior lean Standing balance support: Bilateral upper extremity  supported;During functional activity Standing balance-Leahy Scale: Poor                     ADL Overall ADL's : Needs assistance/impaired     Grooming: Maximal assistance;Oral care;Cueing for safety;Cueing for sequencing;Standing Grooming Details (indicate cue type and reason): Pt with knee flexion and forward flexion at hips. pt needed constant cues at shoulder for upright posture and therapist helping support (A) sink level. pt requires max (A) to static stand for oral care. Pt unable to complete oral care completely due to fatigue and decr static standing. pt returned to sitting in chair then provided mouth wash         Upper Body Dressing : Total assistance;Sitting Upper Body Dressing Details (indicate cue type and reason): pt total (A) to don back brace. pt with posterior lean and posterior pelvic tilt. Pt unable to sustain static sitting EOB.      Toilet Transfer: Moderate assistance;BSC;RW           Functional mobility during ADLs: Moderate assistance;Rolling walker General ADL Comments: Pt remains with balance deficits, forward flexion at hips, severe pain in Rt LE and slow progress toward goals. Called CIR coordinator regarding recommendation. Pt s spouse very concered with confusion level and need for further workup in [place] for blood work. Spouse provided paper and pen to document all questions and information to help advocate/ organzie information being provided.      Vision                     Perception     Praxis      Cognition   Behavior During Therapy: Flat affect Overall Cognitive Status: Impaired/Different from baseline Area of Impairment: Orientation;Safety/judgement;Problem solving Orientation Level:  Disoriented to;Time   Memory: Decreased short-term memory    Safety/Judgement: Decreased awareness of deficits   Problem Solving: Slow processing General Comments: Pt rolling blanket in hands and very restless. pt asked "do you need  help?" pt stops looks at therapist and states "I dont know what I am doing. Was I folding it?"    Extremity/Trunk Assessment               Exercises     Shoulder Instructions       General Comments      Pertinent Vitals/ Pain       Pain Assessment: 0-10 Pain Score: 10-Worst pain ever Pain Location: Rt leg Pain Intervention(s): Repositioned;Other (comment) (Rn called for pain medication)  Home Living                                          Prior Functioning/Environment              Frequency Min 2X/week     Progress Toward Goals  OT Goals(current goals can now be found in the care plan section)  Progress towards OT goals: Progressing toward goals  Acute Rehab OT Goals Patient Stated Goal: To return home and improve overall ambulation with decrease pain OT Goal Formulation: With patient/family Time For Goal Achievement: 01/06/14 Potential to Achieve Goals: Good ADL Goals Pt Will Perform Grooming: with modified independence;standing Pt Will Perform Upper Body Bathing: with supervision;sitting Pt Will Perform Lower Body Bathing: with supervision;sit to/from stand;with adaptive equipment Pt Will Transfer to Toilet: with min guard assist;bedside commode Additional ADL Goal #1: Pt will complete bed mobility min (A) with out bed rails and hob less 20 degrees  Plan Discharge plan remains appropriate    Co-evaluation                 End of Session Equipment Utilized During Treatment: Gait belt;Rolling walker;Back brace   Activity Tolerance Patient limited by pain   Patient Left in chair;in CPM;with chair alarm set;with family/visitor present   Nurse Communication Mobility status;Precautions        Time: 4098-1191 OT Time Calculation (min): 35 min  Charges: OT General Charges $OT Visit: 1 Procedure OT Treatments $Self Care/Home Management : 23-37 mins  Harolyn Rutherford 12/26/2013, 11:42 AM Pager: [phone]

## 2013-12-26 NOTE — Progress Notes (Signed)
OT NOTE  Question need for Palliative consult to help with progression with new referral to oncology group? MD please order if in agreement.   Lance [name]   OTR/L Pager: [phone] Office: [phone] .

## 2013-12-27 ENCOUNTER — Inpatient Hospital Stay (HOSPITAL_COMMUNITY)
Admission: RE | Admit: 2013-12-27 | Discharge: 2014-01-06 | DRG: 945 | Disposition: A | Discharge status: Home Health | Payer: Medicare Other | Source: Intra-hospital | Attending: Physical Medicine & Rehabilitation | Admitting: Physical Medicine & Rehabilitation

## 2013-12-27 DIAGNOSIS — Z9889 Other specified postprocedural states: Secondary | ICD-10-CM

## 2013-12-27 DIAGNOSIS — N184 Chronic kidney disease, stage 4 (severe): Secondary | ICD-10-CM

## 2013-12-27 DIAGNOSIS — E869 Volume depletion, unspecified: Secondary | ICD-10-CM | POA: Diagnosis not present

## 2013-12-27 DIAGNOSIS — I5031 Acute diastolic (congestive) heart failure: Secondary | ICD-10-CM

## 2013-12-27 DIAGNOSIS — E859 Amyloidosis, unspecified: Secondary | ICD-10-CM | POA: Diagnosis present

## 2013-12-27 DIAGNOSIS — Z79899 Other long term (current) drug therapy: Secondary | ICD-10-CM | POA: Diagnosis not present

## 2013-12-27 DIAGNOSIS — R339 Retention of urine, unspecified: Secondary | ICD-10-CM

## 2013-12-27 DIAGNOSIS — N401 Enlarged prostate with lower urinary tract symptoms: Secondary | ICD-10-CM | POA: Diagnosis not present

## 2013-12-27 DIAGNOSIS — E875 Hyperkalemia: Secondary | ICD-10-CM

## 2013-12-27 DIAGNOSIS — M4716 Other spondylosis with myelopathy, lumbar region: Secondary | ICD-10-CM | POA: Diagnosis not present

## 2013-12-27 DIAGNOSIS — B370 Candidal stomatitis: Secondary | ICD-10-CM

## 2013-12-27 DIAGNOSIS — K5900 Constipation, unspecified: Secondary | ICD-10-CM

## 2013-12-27 DIAGNOSIS — Z96649 Presence of unspecified artificial hip joint: Secondary | ICD-10-CM | POA: Diagnosis not present

## 2013-12-27 DIAGNOSIS — N170 Acute kidney failure with tubular necrosis: Secondary | ICD-10-CM

## 2013-12-27 DIAGNOSIS — Z5189 Encounter for other specified aftercare: Secondary | ICD-10-CM | POA: Diagnosis present

## 2013-12-27 DIAGNOSIS — N138 Other obstructive and reflux uropathy: Secondary | ICD-10-CM

## 2013-12-27 DIAGNOSIS — IMO0002 Reserved for concepts with insufficient information to code with codable children: Secondary | ICD-10-CM

## 2013-12-27 DIAGNOSIS — F4323 Adjustment disorder with mixed anxiety and depressed mood: Secondary | ICD-10-CM | POA: Diagnosis present

## 2013-12-27 DIAGNOSIS — F411 Generalized anxiety disorder: Secondary | ICD-10-CM

## 2013-12-27 DIAGNOSIS — F4321 Adjustment disorder with depressed mood: Secondary | ICD-10-CM | POA: Diagnosis not present

## 2013-12-27 DIAGNOSIS — E871 Hypo-osmolality and hyponatremia: Secondary | ICD-10-CM

## 2013-12-27 DIAGNOSIS — S32009A Unspecified fracture of unspecified lumbar vertebra, initial encounter for closed fracture: Secondary | ICD-10-CM

## 2013-12-27 DIAGNOSIS — X500XXA Overexertion from strenuous movement or load, initial encounter: Secondary | ICD-10-CM | POA: Diagnosis not present

## 2013-12-27 DIAGNOSIS — W240XXA Contact with lifting devices, not elsewhere classified, initial encounter: Secondary | ICD-10-CM

## 2013-12-27 DIAGNOSIS — S32009S Unspecified fracture of unspecified lumbar vertebra, sequela: Secondary | ICD-10-CM

## 2013-12-27 DIAGNOSIS — G959 Disease of spinal cord, unspecified: Secondary | ICD-10-CM | POA: Diagnosis present

## 2013-12-27 DIAGNOSIS — I959 Hypotension, unspecified: Secondary | ICD-10-CM

## 2013-12-27 DIAGNOSIS — N189 Chronic kidney disease, unspecified: Secondary | ICD-10-CM

## 2013-12-27 DIAGNOSIS — M48061 Spinal stenosis, lumbar region without neurogenic claudication: Secondary | ICD-10-CM | POA: Diagnosis present

## 2013-12-27 DIAGNOSIS — R338 Other retention of urine: Secondary | ICD-10-CM | POA: Diagnosis present

## 2013-12-27 LAB — URINALYSIS, ROUTINE W REFLEX MICROSCOPIC
Bilirubin Urine: NEGATIVE
Glucose, UA: NEGATIVE mg/dL
Ketones, ur: NEGATIVE mg/dL
NITRITE: NEGATIVE
PROTEIN: 30 mg/dL — AB
Specific Gravity, Urine: 1.013 (ref 1.005–1.030)
UROBILINOGEN UA: 0.2 mg/dL (ref 0.0–1.0)
pH: 5 (ref 5.0–8.0)

## 2013-12-27 LAB — URINE MICROSCOPIC-ADD ON

## 2013-12-27 MED ORDER — FAMOTIDINE 10 MG PO TABS
10.0000 mg | ORAL_TABLET | Freq: Every day | ORAL | Status: DC
  Administered 2013-12-27 – 2014-01-06 (×11): 10 mg via ORAL
  Filled 2013-12-27 (×12): qty 1

## 2013-12-27 MED ORDER — LEVOTHYROXINE SODIUM 50 MCG PO TABS
50.0000 ug | ORAL_TABLET | Freq: Every day | ORAL | Status: DC
  Administered 2013-12-28 – 2014-01-06 (×9): 50 ug via ORAL
  Filled 2013-12-27 (×11): qty 1

## 2013-12-27 MED ORDER — METHOCARBAMOL 500 MG PO TABS
500.0000 mg | ORAL_TABLET | Freq: Four times a day (QID) | ORAL | Status: DC | PRN
  Administered 2013-12-27 – 2014-01-05 (×14): 500 mg via ORAL
  Filled 2013-12-27 (×15): qty 1

## 2013-12-27 MED ORDER — POLYETHYLENE GLYCOL 3350 17 G PO PACK
17.0000 g | PACK | Freq: Two times a day (BID) | ORAL | Status: DC
  Administered 2013-12-27 – 2014-01-01 (×7): 17 g via ORAL
  Filled 2013-12-27 (×12): qty 1

## 2013-12-27 MED ORDER — ACETAMINOPHEN 325 MG PO TABS
325.0000 mg | ORAL_TABLET | ORAL | Status: DC | PRN
  Administered 2013-12-30: 650 mg via ORAL
  Filled 2013-12-27: qty 2

## 2013-12-27 MED ORDER — ENOXAPARIN SODIUM 30 MG/0.3ML ~~LOC~~ SOLN
30.0000 mg | SUBCUTANEOUS | Status: DC
  Administered 2013-12-27 – 2014-01-05 (×9): 30 mg via SUBCUTANEOUS
  Filled 2013-12-27 (×12): qty 0.3

## 2013-12-27 MED ORDER — GUAIFENESIN-DM 100-10 MG/5ML PO SYRP: 5.0000 mL | ORAL_SOLUTION | Freq: Four times a day (QID) | ORAL | Status: DC | PRN

## 2013-12-27 MED ORDER — TRAMADOL HCL 50 MG PO TABS
25.0000 mg | ORAL_TABLET | Freq: Four times a day (QID) | ORAL | Status: DC | PRN
  Administered 2013-12-28 – 2014-01-06 (×21): 25 mg via ORAL
  Filled 2013-12-27 (×22): qty 1

## 2013-12-27 MED ORDER — DIPHENHYDRAMINE HCL 12.5 MG/5ML PO ELIX: 12.5000 mg | ORAL_SOLUTION | Freq: Four times a day (QID) | ORAL | Status: DC | PRN

## 2013-12-27 MED ORDER — SIMVASTATIN 10 MG PO TABS
10.0000 mg | ORAL_TABLET | Freq: Every day | ORAL | Status: DC
  Administered 2013-12-27 – 2014-01-05 (×10): 10 mg via ORAL
  Filled 2013-12-27 (×11): qty 1

## 2013-12-27 MED ORDER — ALUM & MAG HYDROXIDE-SIMETH 200-200-20 MG/5ML PO SUSP: 30.0000 mL | ORAL | Status: DC | PRN

## 2013-12-27 MED ORDER — GABAPENTIN 100 MG PO CAPS
200.0000 mg | ORAL_CAPSULE | Freq: Three times a day (TID) | ORAL | Status: DC
  Administered 2013-12-27 – 2014-01-06 (×28): 200 mg via ORAL
  Filled 2013-12-27 (×32): qty 2

## 2013-12-27 MED ORDER — FLUCONAZOLE 100 MG PO TABS
100.0000 mg | ORAL_TABLET | Freq: Every day | ORAL | Status: AC
  Administered 2013-12-27 – 2014-01-02 (×7): 100 mg via ORAL
  Filled 2013-12-27 (×7): qty 1

## 2013-12-27 MED ORDER — MENTHOL 3 MG MT LOZG
1.0000 | LOZENGE | OROMUCOSAL | Status: DC | PRN
Start: 2013-12-27 — End: 2014-01-06
  Filled 2013-12-27: qty 9

## 2013-12-27 MED ORDER — PANTOPRAZOLE SODIUM 40 MG PO TBEC
40.0000 mg | DELAYED_RELEASE_TABLET | Freq: Two times a day (BID) | ORAL | Status: DC
  Administered 2013-12-27 – 2014-01-06 (×19): 40 mg via ORAL
  Filled 2013-12-27 (×19): qty 1

## 2013-12-27 MED ORDER — BISACODYL 10 MG RE SUPP: 10.0000 mg | Freq: Every day | RECTAL | Status: DC | PRN

## 2013-12-27 MED ORDER — FLEET ENEMA 7-19 GM/118ML RE ENEM: 1.0000 | ENEMA | Freq: Once | RECTAL | Status: AC | PRN

## 2013-12-27 MED ORDER — TAMSULOSIN HCL 0.4 MG PO CAPS
0.4000 mg | ORAL_CAPSULE | Freq: Two times a day (BID) | ORAL | Status: DC
  Administered 2013-12-27 – 2014-01-06 (×20): 0.4 mg via ORAL
  Filled 2013-12-27 (×23): qty 1

## 2013-12-27 MED ORDER — PHENOL 1.4 % MT LIQD
1.0000 | OROMUCOSAL | Status: DC | PRN
  Administered 2013-12-30: 1 via OROMUCOSAL
  Filled 2013-12-27: qty 177

## 2013-12-27 MED ORDER — TRAZODONE HCL 50 MG PO TABS: 25.0000 mg | ORAL_TABLET | Freq: Every evening | ORAL | Status: DC | PRN

## 2013-12-27 NOTE — Progress Notes (Signed)
Patient unable to void all evening. Bladder scanner showed greater than . In and out cath revealed . Patient is much more comfortable now and resting at this time. Will continue to monitor. Zaedyn Covin, [place]

## 2013-12-27 NOTE — PMR Pre-admission (Signed)
PMR Admission Coordinator Pre-Admission Assessment  Patient: Lance [name] is an 73 y.o., male MRN: [id number] DOB: 1940-06-27 Height: 5' 8"  (172.7 cm) Weight: 62.5 kg (137 lb 12.6 oz)              Insurance Information HMO:     PPO:      PCP:      IPA:      80/20: yes     OTHER: no HMO PRIMARY: Medicare a and b      Policy#: [id number]      Subscriber: pt Benefits:  Phone #: [email or web address] online     Name: 12/26/13 Eff. Date: 08/03/07     Deduct: $1260      Out of Pocket Max: none      Life Max: none CIR: 100%      SNF: 20 full days Outpatient: 80%     Co-Pay: 20% Home Health: 100%      Co-Pay: none DME: 80%     Co-Pay: 20% Providers: pt choice  SECONDARY: Roseanna Rainbow      Policy#: [id number]      Subscriber: pt  Medicaid Application Date:       Case Manager:  Disability Application Date:       Case Worker:   Emergency Contact Information Contact Information   Name Relation Home Work Mobile   Erin Significant other [phone]  [phone]     Current Medical History  Patient Admitting Diagnosis:Rigth L3 radic, L3 compression fx s/p decompression/kyphoplasty  History of Present Illness: [name] is a 73 y.o. male with history of CKD, anxiety disorder, recent 20 lbs weight loss, Lifting injury 2 months ago with subsequent L3 compression Fx with stenosis and L3 radiculopathy.   He has failed conservative treatment and elected to undergo L2/3 decompressive laminectomy with foraminotomy and open kyphoplasty vertebroplasty by Dr. Annette Stable on 12/22/13. Bone biopsy negative for tumor and patient with recent dx of renal amyloidosis. Post op has had issues with urinary retention, acute on chronic renal failure as well as confusion.   Pathology of his L3 fractured vertebra is negative for tumor. Pathology done at an outside institution for his renal biopsy is consistent with amyloidosis.With regard to the patient's new diagnosis of renal amyloidosis Dr. Annette Stable discussed situation with  the patient's nephrologist and consults and are hematology oncology group. Plan is for him to be followed as an outpatient for this. No acute treatment or further workup is indicated as the patient's L3 vertebral body biopsy will serve as his bone marrow biopsy.  Past Medical History  Past Medical History  Diagnosis Date  . GERD (gastroesophageal reflux disease)   . Arthritis     osteoarthritis  . Hypothyroidism   . Chronic kidney disease     bph,kidney bx,renal insuff.  . Anxiety   . Weight decrease     20lbs    Family History  family history is not on file.  Prior Rehab/Hospitalizations: HH after hip replacement  Current Medications  Current facility-administered medications:0.9 %  sodium chloride infusion, 250 mL, Intravenous, Continuous, Charlie Pitter, MD;  0.9 %  sodium chloride infusion, , Intravenous, Once, Hosie Spangle, MD;  acetaminophen (TYLENOL) suppository 650 mg, 650 mg, Rectal, Q4H PRN, Charlie Pitter, MD;  acetaminophen (TYLENOL) tablet 650 mg, 650 mg, Oral, Q4H PRN, Charlie Pitter, MD alum & mag hydroxide-simeth (MAALOX/MYLANTA) 200-200-20 MG/5ML suspension 30 mL, 30 mL, Oral, Q6H PRN, Charlie Pitter, MD;  bisacodyl (DULCOLAX) suppository 10 mg,  10 mg, Rectal, Daily PRN, Charlie Pitter, MD, 10 mg at 12/26/13 1052;  cyclobenzaprine (FLEXERIL) tablet 10 mg, 10 mg, Oral, TID PRN, Charlie Pitter, MD, 10 mg at 12/27/13 0119;  docusate sodium (COLACE) capsule 100 mg, 100 mg, Oral, Daily, Charlie Pitter, MD, 100 mg at 12/26/13 1043 famotidine (PEPCID) tablet 10 mg, 10 mg, Oral, Daily, Charlie Pitter, MD, 10 mg at 12/26/13 1043;  feeding supplement (NEPRO CARB STEADY) liquid 237 mL, 237 mL, Oral, Q24H, Hazle Coca, RD, 237 mL at 12/26/13 1416;  gabapentin (NEURONTIN) capsule 300 mg, 300 mg, Oral, TID, Charlie Pitter, MD, 300 mg at 12/26/13 2253 HYDROcodone-acetaminophen (NORCO/VICODIN) 5-325 MG per tablet 1 tablet, 1 tablet, Oral, Q4H PRN, Elaina Hoops, MD, 1 tablet at 12/27/13 0119;   HYDROmorphone (DILAUDID) injection 0.5-1 mg, 0.5-1 mg, Intravenous, Q2H PRN, Charlie Pitter, MD, 1 mg at 12/22/13 2004;  levothyroxine (SYNTHROID, LEVOTHROID) tablet 50 mcg, 50 mcg, Oral, QAC breakfast, Charlie Pitter, MD, 50 mcg at 12/27/13 0840 menthol-cetylpyridinium (CEPACOL) lozenge 3 mg, 1 lozenge, Oral, PRN, Charlie Pitter, MD;  ondansetron [facility]) injection 4 mg, 4 mg, Intravenous, Q4H PRN, Charlie Pitter, MD, 4 mg at 12/26/13 1544;  pantoprazole (PROTONIX) EC tablet 40 mg, 40 mg, Oral, BID, [facility] [place], 40 mg at 12/26/13 2253;  phenol (CHLORASEPTIC) mouth spray 1 spray, 1 spray, Mouth/Throat, PRN, Charlie Pitter, MD polyethylene glycol (MIRALAX / GLYCOLAX) packet 17 g, 17 g, Oral, Daily PRN, Charlie Pitter, MD, 17 g at 12/26/13 0827;  senna (SENOKOT) tablet 8.6 mg, 1 tablet, Oral, BID, Charlie Pitter, MD, 8.6 mg at 12/26/13 2253;  simvastatin (ZOCOR) tablet 10 mg, 10 mg, Oral, q1800, Charlie Pitter, MD, 10 mg at 12/26/13 7494;  sodium chloride 0.9 % injection 3 mL, 3 mL, Intravenous, Q12H, Charlie Pitter, MD, 3 mL at 12/26/13 2253 sodium chloride 0.9 % injection 3 mL, 3 mL, Intravenous, PRN, Charlie Pitter, MD;  tamsulosin [facility]) capsule 0.4 mg, 0.4 mg, Oral, Daily, Charlie Pitter, MD, 0.4 mg at 12/26/13 1043  Patients Current Diet: Renal  Precautions / Restrictions Precautions Precautions: Back Precaution Booklet Issued: No Precaution Comments: reviewed precautions Spinal Brace: Lumbar corset;Applied in sitting position Restrictions Weight Bearing Restrictions: No   Prior Activity Level Limited Community (1-2x/wk): pt active until 3 months ago with greatest decline in function over past 4 weeks  Development worker, international aid / [place] Devices/Equipment: Eyeglasses;Walker (specify type) Home Equipment: Cane - single point;Walker - 4 wheels;Hand held shower head;Shower seat - built in  Prior Functional Level Prior Function Level of Independence: Independent with assistive  device(s);Needs assistance Gait / Transfers Assistance Needed: using 4 [employer] ADL's / Homemaking Assistance Needed: (A) with setup of adls Comments: walker is a borrowed walker he recently has been using only over last month. previously drove self, worked out weekly and maintained yard  Current Functional Level Cognition  Overall Cognitive Status: Impaired/Different from baseline Orientation Level: Oriented X4;Other (comment) (Forgetful) Safety/Judgement: Decreased awareness of deficits General Comments: Pt rolling blanket in hands and very restless. pt asked "do you need help?" pt stops looks at therapist and states "I dont know what I am doing. Was I folding it?"    Extremity Assessment (includes Sensation/Coordination)          ADLs  Overall ADL's : Needs assistance/impaired Eating/Feeding: Modified independent;Sitting Grooming: Maximal assistance;Oral care;Cueing for safety;Cueing for sequencing;Standing Grooming Details (indicate cue type and reason): Pt with  knee flexion and forward flexion at hips. pt needed constant cues at shoulder for upright posture and therapist helping support (A) sink level. pt requires max (A) to static stand for oral care. Pt unable to complete oral care completely due to fatigue and decr static standing. pt returned to sitting in chair then provided mouth wash Upper Body Bathing: Minimal assitance;Sitting Lower Body Bathing: Moderate assistance;Sit to/from stand Upper Body Dressing : Total assistance;Sitting Upper Body Dressing Details (indicate cue type and reason): pt total (A) to don back brace. pt with posterior lean and posterior pelvic tilt. Pt unable to sustain static sitting EOB.  Lower Body Dressing: Maximal assistance;Sit to/from stand Lower Body Dressing Details (indicate cue type and reason): pt don mesh underwear with pad to prevent incontinence with sit<>Stand Toilet Transfer: Moderate assistance;BSC;RW Functional mobility during ADLs: Moderate  assistance;Rolling walker General ADL Comments: Pt remains with balance deficits, forward flexion at hips, severe pain in Rt LE and slow progress toward goals. Called CIR coordinator regarding recommendation. Pt s spouse very concered with confusion level and need for further workup in [place] for blood work. Spouse provided paper and pen to document all questions and information to help advocate/ organzie information being provided.    Mobility  Overal bed mobility: Needs Assistance Bed Mobility: Supine to Sit;Sidelying to Sit;Rolling Rolling: Max assist (cues to sequence) Sidelying to sit: Mod assist Supine to sit: Mod assist Sit to supine: Min assist General bed mobility comments: mod cues for proper sequence for side to sit edge of bed. pt wanting to keep right hand under right leg due to pain,max cues to use that hand with bed mobiltiy.    Transfers  Overall transfer level: Needs assistance Equipment used: Rolling walker (2 wheeled) Transfers: Sit to/from Stand Sit to Stand: Min assist General transfer comment: cues for hand placement and ant weight shift with standing. no posterior LOB with stand from bed after cues were provided.    Ambulation / Gait / Stairs / Wheelchair Mobility  Ambulation/Gait Ambulation/Gait assistance: Museum/gallery curator (Feet): 20 Feet Assistive device: Rolling walker (2 wheeled) Gait Pattern/deviations: Trunk flexed;Narrow base of support;Step-to pattern;Antalgic;Decreased stance time - right;Decreased weight shift to right Gait velocity: decreased Gait velocity interpretation: Below normal speed for age/gender General Gait Details: bilateral knees flexed despite vc's. cues on posture, walker position and for heel strike with intital contact with gait.    Posture / Balance Dynamic Sitting Balance Sitting balance - Comments: posterior lean and pelvic tilt    Special needs/care consideration Bowel mgmt: BM 8/25. Overall constipated since  admission Bladder mgmt: I and O caths since surgery. PTA had urgency and used bathroom 3 to 4 times per night. Difficulty with flow of urine    Previous Home Environment Living Arrangements: Spouse/significant other  Lives With: Spouse;Significant other;Other (Comment) [name]) Available Help at Discharge: Family;Available 24 hours/day Type of Home: House Home Layout: One level Home Access: Level entry Bathroom Shower/Tub: Walk-in shower;Door [employer] Toilet: Handicapped height Bathroom Accessibility: Yes How Accessible: Accessible via walker Home Care Services: No  Discharge Living Setting Plans for Discharge Living Setting: Patient's home;Lives with (comment);Other (Comment) (spouse, [name]) Type of Home at Discharge: House Discharge Home Layout: One level Discharge Home Access: Level entry Discharge Bathroom Shower/Tub: Walk-in shower Discharge Bathroom Toilet: Handicapped height Discharge Bathroom Accessibility: Yes How Accessible: Accessible via walker Does the patient have any problems obtaining your medications?: No  Social/Family/Support Systems Patient Roles: Partner;Spouse Contact Information: [name], spouse Anticipated Caregiver:  spouse, [name] Anticipated Caregiver's Contact Information: h [phone]; cell [phone] Ability/Limitations of Caregiver: none Caregiver Availability: 24/7 Discharge Plan Discussed with Primary Caregiver: Yes Is Caregiver In Agreement with Plan?: Yes Does Caregiver/Family have Issues with Lodging/Transportation while Pt is in Rehab?: No  Goals/Additional Needs Patient/Family Goal for Rehab: Mod I PT and OT Expected length of stay: ELOS 9-13 days Special Service Needs: pt confused this admission which is unlike him per [name], he feels steroids contributing to his confusion Pt/Family Agrees to Admission and willing to participate: Yes Program Orientation Provided & Reviewed with Pt/Caregiver Including Roles  & Responsibilities:  Yes  Decrease burden of Care through IP rehab admission: n/a  Possible need for SNF placement upon discharge:not anticipated  Patient Condition: This patient's condition remains as documented in the consult dated 12/26/2013, in which the Rehabilitation Physician determined and documented that the patient's condition is appropriate for intensive rehabilitative care in an inpatient rehabilitation facility. Will admit to inpatient rehab today.  Preadmission Screen Completed By:  Cleatrice Burke, 12/27/2013 9:31 AM ______________________________________________________________________   Discussed status with Dr. Naaman Plummer on 12/27/2013 at [phone] and received telephone approval for admission today.  Admission Coordinator:  Cleatrice Burke, time 8889 Date 12/27/2013.

## 2013-12-27 NOTE — Discharge Summary (Signed)
Physician Discharge Summary  Patient ID: Lance [name] MRN: [id number] DOB/AGE: 1940/06/12 73 y.o.  Admit date: 12/22/2013 Discharge date: 12/27/2013  Admission Diagnoses:  Discharge Diagnoses:  Principal Problem:   Closed lumbar vertebral fracture Active Problems:   Spinal stenosis of lumbar region   Lumbar vertebral fracture   Malnutrition of moderate degree   Discharged Condition: fair  Hospital Course: The patient was admitted for workup and treatment of an L3 fracture with resultant stenosis and radiculopathy. Patient underwent right-sided L2-3 decompressive laminotomy and foraminotomies for hopeful relief of his severe radicular pain and weakness. Open vertebral biopsy of L3 was taken at that time. Planned methylmethacrylate vertebroplasty could not be performed. Findings at surgery were of a significant residual fracture of L3. Biopsies were consistent with benign osteoporotic compression fracture without evidence of malignancy. Concurrent workup of a renal lesion by an outside nephrologist as diagnosed the patient with renal amyloidosis. I have contacted our hematology oncology doctor. He does not feel that additional bone marrow sampling would be of benefit and that he is comfortable following this patient as an outpatient.  Postoperatively the patient's back and radicular pain are improved but are still significant. He is gradually mobilizing with therapy. He should remain in a lumbar brace. My hope is that his pain will be managed without further surgery. I think it'll later date we could potentially revisit percutaneous methylmethacrylate vertebroplasty or possibly consider lumbar fusion surgery however given the patient's bone quality and malnutrition additional more invasive surgery is certainly not a great option. Plan is for transfer to inpatient rehabilitation for further convalescence and therapy.  Consults:   Significant Diagnostic Studies:   Treatments:   Discharge  Exam: Blood pressure 98/61, pulse 107, temperature 98.7 F (37.1 C), temperature source Oral, resp. rate 18, height  (1.727 m), weight 62.5 kg (137 lb 12.6 oz), SpO2 97.00%. Awake and alert. Oriented and appropriate. Motor examination intact except for right quadriceps weakness 4/5. Chronic right L3 sensory loss. Wound clean and dry. Chest and abdomen benign.  Disposition: 06-Home-Health Care Svc     Medication List         BIOFREEZE EX  Apply 1 application topically 3 (three) times daily as needed (for pain).     docusate sodium 100 MG capsule  Commonly known as:  COLACE  Take 100 mg by mouth daily.     famotidine 10 MG chewable tablet  Commonly known as:  PEPCID AC  Chew 10 mg by mouth daily.     gabapentin 300 MG capsule  Commonly known as:  NEURONTIN  Take 300 mg by mouth 3 (three) times daily.     HYDROcodone-acetaminophen 5-325 MG per tablet  Commonly known as:  NORCO/VICODIN  Take 1 tablet by mouth every 6 (six) hours as needed for moderate pain (every 8 hrs).     pravastatin 20 MG tablet  Commonly known as:  PRAVACHOL  Take 20 mg by mouth at bedtime.     SYNTHROID 50 MCG tablet  Generic drug:  levothyroxine  Take 50 mcg by mouth daily.     tamsulosin 0.4 MG Caps capsule  Commonly known as:  FLOMAX  Take 0.4 mg by mouth daily.           Follow-up Information   Follow up with Temple Pacini, MD.   Specialty:  Neurosurgery   Contact information:   [address] [place] [address] [phone]       Signed: Temple Pacini 12/27/2013, 9:38  AM

## 2013-12-27 NOTE — Progress Notes (Signed)
Patient ID: CASS VANDERMEULEN, male   DOB: 08-24-1940, 73 y.o.   MRN: [id number] Patient admitted to 4W08 via bed, escorted by nursing staff and partner.  Assessment completed. Redness noted to heels and sacrum- blanchable, MSAD to perineum, powder applied.  Patient and family verbalized understanding of rehab process, no additional questions asked.  Rehab notebook given, signed safety plan.  VSS.  Will continue to monitor.  Dani Gobble, RN

## 2013-12-27 NOTE — Progress Notes (Signed)
Physical Medicine and Rehabilitation Consult   Reason for Consult: L3 compression fracture with radiculopathy.   Referring Physician: Dr. Jordan Likes     HPI: Lance [name] is a 73 y.o. male with history of CKD, anxiety disorder, recent 20 lbs weight loss,  Lifting injury 2 months ago with subsequent L3 compression Fx with stenosis and L3 radiculopathy. He has failed conservative treatment and elected to undergo L2/3 decompressive laminectomy with foraminotomy and open kyphoplasty vertebroplasty by Dr. Jordan Likes on 12/22/13. Bone biopsy negative for tumor and patient with recent dx of renal amyloidosis.  Post op has had issues with urinary retention, acute on chronic renal failure as well as confusion. Therapy ongoing but patient limited by pain, poor posture as well as deconditioning. MD, PT, OT recommending CIR for progression.      Review of Systems  HENT: Negative for hearing loss.   Eyes: Positive for blurred vision.  Respiratory: Negative for cough and shortness of breath.   Cardiovascular: Negative for chest pain and palpitations.  Gastrointestinal: Positive for constipation (no BM since surgery). Negative for heartburn, nausea and abdominal pain.  Musculoskeletal: Positive for back pain and myalgias.  Neurological: Positive for sensory change and focal weakness. Negative for dizziness and headaches.  Psychiatric/Behavioral: The patient is nervous/anxious.        Past Medical History   Diagnosis  Date   .  GERD (gastroesophageal reflux disease)     .  Arthritis         osteoarthritis   .  Hypothyroidism     .  Chronic kidney disease         bph,kidney bx,renal insuff.   .  Anxiety     .  Weight decrease         20lbs       Past Surgical History   Procedure  Laterality  Date   .  Rotator cuff repair    2004       right rotator cuff repair   .  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  Hand surgery       .  Renal biopsy       .  Laminectomy           L2 L3  WITH OPEN KYPHOPLASTY   .  Lumbar laminectomy/decompression microdiscectomy  Right  12/22/2013       Procedure: Right L/2-3LUMBAR DECOMPRESSION 1 LEVEL ;  Surgeon: Temple Pacini, MD;  Location: [facility];  Service: Neurosurgery;  Laterality: Right;  Right L/2-3LUMBAR DECOMPRESSION 1 LEVEL      History reviewed. No pertinent family history.   Social History:  Lives with supportive partner. Retired from [employer] (worked in [employer]) he reports that he has never smoked. He has never used smokeless tobacco. He reports that he drinks alcohol. He reports that he does not use illicit drugs.     Allergies: No Known Allergies      Medications Prior to Admission   Medication  Sig  Dispense  Refill   .  docusate sodium (COLACE) 100 MG capsule  Take 100 mg by mouth daily.         .  famotidine (PEPCID AC) 10 MG chewable tablet  Chew 10 mg by mouth  daily.         .  gabapentin (NEURONTIN) 300 MG capsule  Take 300 mg by mouth 3 (three) times daily.         Marland Kitchen  HYDROcodone-acetaminophen (NORCO/VICODIN) 5-325 MG per tablet  Take 1 tablet by mouth every 6 (six) hours as needed for moderate pain (every 8 hrs).         Marland Kitchen  levothyroxine (SYNTHROID) 50 MCG tablet  Take 50 mcg by mouth daily.         .  Menthol, Topical Analgesic, (BIOFREEZE EX)  Apply 1 application topically 3 (three) times daily as needed (for pain).         .  pravastatin (PRAVACHOL) 20 MG tablet  Take 20 mg by mouth at bedtime.          .  tamsulosin (FLOMAX) 0.4 MG CAPS capsule  Take 0.4 mg by mouth daily.            Home: Home Living Family/patient expects to be discharged to:: Private residence Living Arrangements: Spouse/significant other Available Help at Discharge: Family;Available 24 hours/day Type of Home:  House (town home) Home Access: Level entry Home Layout: One level Home Equipment: Cane - single point;Walker - 4 wheels;Hand held shower head;Shower seat - built in   Functional History: Prior Function Level of Independence: Independent with assistive device(s);Needs assistance Gait / Transfers Assistance Needed: using 4 [employer] ADL's / Homemaking Assistance Needed: (A) with setup of adls Functional Status:   Mobility: Bed Mobility Overal bed mobility: Needs Assistance Bed Mobility: Supine to Sit;Sidelying to Sit;Rolling Rolling: Max assist (cues to sequence) Sidelying to sit: Mod assist Supine to sit: Mod assist Sit to supine: Min assist General bed mobility comments: Pt requires max cueing for sequence and maintain back precautions. Pt with poor return demo of log roll. Transfers Overall transfer level: Needs assistance Equipment used: Rolling walker (2 wheeled) Transfers: Sit to/from Stand Sit to Stand: Mod assist General transfer comment: cues for hand placement. pt with immediate LOB posterior with static standing. pt needed proprioception input at toes to help pt weight shift forward.  Ambulation/Gait Ambulation/Gait assistance: Min assist Ambulation Distance (Feet): 50 Feet Assistive device: Rolling walker (2 wheeled) Gait Pattern/deviations: Trunk flexed;Step-to pattern;Decreased weight shift to right Gait velocity: decreased Gait velocity interpretation: <1.8 ft/sec, indicative of risk for recurrent falls General Gait Details: bilateral knees flexed despite vc's, pt still on ball of right foot with step to pattern up to it. He reports he cannot even out stride due to pain. vc's for erect posture.   ADL: ADL Overall ADL's : Needs assistance/impaired Eating/Feeding: Modified independent;Sitting Grooming: Maximal assistance;Oral care;Cueing for safety;Cueing for sequencing;Standing Grooming Details (indicate cue type and reason): Pt with knee flexion and forward flexion at  hips. pt needed constant cues at shoulder for upright posture and therapist helping support (A) sink level. pt requires max (A) to static stand for oral care. Pt unable to complete oral care completely due to fatigue and decr static standing. pt returned to sitting in chair then provided mouth wash Upper Body Bathing: Minimal assitance;Sitting Lower Body Bathing: Moderate assistance;Sit to/from stand Upper Body Dressing : Total assistance;Sitting Upper Body Dressing Details (indicate cue type and reason): pt total (A) to don back brace. pt with posterior lean and posterior pelvic tilt. Pt unable to sustain static sitting EOB.   Lower Body Dressing: Maximal assistance;Sit to/from stand Lower Body Dressing Details (indicate cue type and reason): pt don mesh underwear with pad to  prevent incontinence with sit<>Stand Toilet Transfer: Moderate assistance;BSC;RW Functional mobility during ADLs: Moderate assistance;Rolling walker General ADL Comments: Pt remains with balance deficits, forward flexion at hips, severe pain in Rt LE and slow progress toward goals. Called CIR coordinator regarding recommendation. Pt s spouse very concered with confusion level and need for further workup in [place] for blood work. Spouse provided paper and pen to document all questions and information to help advocate/ organzie information being provided.   Cognition: Cognition Overall Cognitive Status: Impaired/Different from baseline Orientation Level: Oriented to person;Oriented to place;Oriented to situation (forgetful at times) Cognition Arousal/Alertness: Awake/alert Behavior During Therapy: Flat affect Overall Cognitive Status: Impaired/Different from baseline Area of Impairment: Orientation;Safety/judgement;Problem solving Orientation Level: Disoriented to;Time Memory: Decreased short-term memory Safety/Judgement: Decreased awareness of deficits Problem Solving: Slow processing General Comments: Pt rolling  blanket in hands and very restless. pt asked "do you need help?" pt stops looks at therapist and states "I dont know what I am doing. Was I folding it?"   Blood pressure 135/78, pulse 112, temperature 97.9 F (36.6 C), temperature source Oral, resp. rate 18, height  (1.727 m), weight 62.5 kg (137 lb 12.6 oz), SpO2 96.00%. Physical Exam  Nursing note and vitals reviewed. Constitutional: He is oriented to person, place, and time. He appears well-developed and well-nourished.  HENT:   Head: Normocephalic and atraumatic.  Eyes: Conjunctivae are normal. Pupils are equal, round, and reactive to light.  Neck: Normal range of motion. Neck supple.  Cardiovascular: Normal rate and regular rhythm.   Respiratory: Effort normal and breath sounds normal. No respiratory distress. He has no wheezes.  GI: He exhibits distension. Bowel sounds are increased. There is no tenderness.  Neurological: He is alert and oriented to person, place, and time.  Speech clear. Follows commands without difficulty. UES: RUE: deltoid 5/5, bicep 5/5, tricep 5/5, wrist ext 5/5, hand intrinsics 5/5 LUE: deltoid 5/5, bicep 5/5, triceps 5/5, wrist ext 5/5, hand instrincs 5/5 RLE: Hip 2/5 pain, Isolate KE 2/5. HAD 3/5. Ankle 4+/5 LLE: Hip 3+, KE 4/5, ankle 4/5.  Sensory changes over the anterior-lateral thigh to right knee. DTR's 1+  Skin: Skin is warm and dry.  Psychiatric: He has a normal mood and affect. His behavior is normal. Judgment and thought content normal.     Results for orders placed during the hospital encounter of 12/22/13 (from the past 24 hour(s))   BASIC METABOLIC PANEL     Status: Abnormal     Collection Time      12/25/13  1:47 PM       Result  Value  Ref Range     Sodium  129 (*)  137 - 147 mEq/L     Potassium  5.2   3.7 - 5.3 mEq/L     Chloride  93 (*)  96 - 112 mEq/L     CO2  19   19 - 32 mEq/L     Glucose, Bld  163 (*)  70 - 99 mg/dL     BUN  69 (*)  6 - 23 mg/dL     Creatinine, Ser  9.14 (*)   0.50 - 1.35 mg/dL     Calcium  78.2 (*)  8.4 - 10.5 mg/dL     GFR calc non Af Amer  21 (*)  >90 mL/min     GFR calc Af Amer  24 (*)  >90 mL/min     Anion gap  17 (*)  5 - 15  No results found.   Assessment/Plan: Diagnosis: Rigth L3 radic, L3 compression fx s/p decompression/kyphoplasty Does the need for close, 24 hr/day medical supervision in concert with the patient's rehab needs make it unreasonable for this patient to be served in a less intensive setting? Yes Co-Morbidities requiring supervision/potential complications: pain control, oa hips Due to bladder management, bowel management, safety, skin/wound care, disease management, medication administration, pain management and patient education, does the patient require 24 hr/day rehab nursing? Yes Does the patient require coordinated care of a physician, rehab nurse, PT (1-2 hrs/day, 5 days/week) and OT (1-2 hrs/day, 5 days/week) to address physical and functional deficits in the context of the above medical diagnosis(es)? Yes Addressing deficits in the following areas: balance, endurance, locomotion, strength, transferring, bowel/bladder control, bathing, dressing, feeding, grooming, toileting and psychosocial support Can the patient actively participate in an intensive therapy program of at least 3 hrs of therapy per day at least 5 days per week? Yes The potential for patient to make measurable gains while on inpatient rehab is excellent Anticipated functional outcomes upon discharge from inpatient rehab are modified independent  with PT, modified independent with OT, n/a with SLP. Estimated rehab length of stay to reach the above functional goals is: 9-13 days Does the patient have adequate social supports to accommodate these discharge functional goals? Yes Anticipated D/C setting: Home Anticipated post D/C treatments: HH therapy Overall Rehab/Functional Prognosis: excellent   RECOMMENDATIONS: This patient's condition is  appropriate for continued rehabilitative care in the following setting: CIR Patient has agreed to participate in recommended program. Yes Note that insurance prior authorization may be required for reimbursement for recommended care.   Comment: Rehab Admissions Coordinator to follow up.   Thanks,   Lance Oyster, MD, [place] [facility]         12/26/2013    Revision History...     Date/Time User Action   12/26/2013 3:54 PM Lance Oyster, MD Sign   12/26/2013 1:26 PM Lance Cree, PA-C Share  View Details Report   Routing History...     Date/Time From To Method   12/26/2013 3:54 PM Lance Oyster, MD Lance Oyster, MD In Basket   12/26/2013 3:54 PM Lance Oyster, MD Eartha Inch, MD Fax

## 2013-12-27 NOTE — Progress Notes (Signed)
Rehab nurse calls writer pt's room is now cleaned up and ready for pt, she just needs for room floor to dry up. Writer states "OK";  will transport pt after lunch break. Arabella Merles Kiah Keay RN.

## 2013-12-27 NOTE — Progress Notes (Signed)
Pt discharge education and instructions completed; report called off to Whitney in In-patient rehab; pt IV remains intact saline locked; steri-strips remains intact to back incision. Awaiting on Rehab when pt room is ready to transport pt there. Will continue to monitor till pt leave the floor. Pt remains in bed with call light within reach and spouse at bedside. Arabella Merles Rishav Rockefeller RN.

## 2013-12-27 NOTE — Progress Notes (Signed)
I spoke with Dr. Jordan Likes this morning. He is in agreement to pt to being discharged to inpt rehab today. I will arrange. [phone]

## 2013-12-27 NOTE — H&P (Signed)
Physical Medicine and Rehabilitation Admission H&P    CC: L3 compression fracture with radiculopathy  HPI:  Lance [name] is a 73 y.o. male with history of CKD, anxiety disorder, recent 20 lbs weight loss with 3 month h/o progressive weakness and malaise, Lifting injury 2 months ago with subsequent L3 compression Fx with stenosis and L3 radiculopathy. He has failed conservative treatment and elected to undergo L2/3 decompressive laminectomy with foraminotomy and open kyphoplasty vertebroplasty by Dr. Annette Stable on 12/22/13. Bone biopsy negative for tumor and patient with recent dx of renal amyloidosis and will need follow up with Heme/Onc as well as nephrology (MD in [place]). Post op has had issues with urinary retention, acute on chronic renal failure with BUN/Cr rising to 69/2.84. He developed confusion with hallucinations and tremors this weekend with initiation of steroids. Decadron d/c 08/24 and mentation clearing.  Foley discontinued last pm but patient unable to void with in/out cath revealing 900 cc.  Therapy ongoing and CIR recommended by MD and rehab team.    Review of Systems  Constitutional: Positive for malaise/fatigue.  HENT: Negative for hearing loss.   Eyes: Negative for blurred vision.  Respiratory: Negative for shortness of breath.   Cardiovascular: Negative for chest pain and leg swelling.  Gastrointestinal: Positive for nausea and constipation. Negative for abdominal pain.       Sore mouth with difficulty swallowing.   Genitourinary: Positive for frequency.       H/o hesitancy. Does not empty bladder fully per GU.   Musculoskeletal: Positive for back pain and myalgias.  Neurological: Positive for tingling, sensory change (RLE) and weakness. Negative for dizziness and headaches.  Psychiatric/Behavioral: The patient is nervous/anxious.      Past Medical History  Diagnosis Date  . GERD (gastroesophageal reflux disease)   . Arthritis     osteoarthritis  . Hypothyroidism     . Chronic kidney disease     bph,kidney bx,renal insuff.  . Anxiety   . Weight decrease     20lbs    Past Surgical History  Procedure Laterality Date  . Rotator cuff repair  2004    right rotator cuff repair  . 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 Hand surgery    . Renal biopsy    . Laminectomy      L2 L3  WITH OPEN KYPHOPLASTY  . Lumbar laminectomy/decompression microdiscectomy Right 12/22/2013    Procedure: Right L/2-3LUMBAR DECOMPRESSION 1 LEVEL ;  Surgeon: Charlie Pitter, MD;  Location: [facility];  Service: Neurosurgery;  Laterality: Right;  Right L/2-3LUMBAR DECOMPRESSION 1 LEVEL    History reviewed. No pertinent family history.  Social History: Lives with supportive partner. Retired from [employer] (worked in [employer]) he reports that he has never smoked. He has never used smokeless tobacco. He reports that he drinks alcohol. He reports that he does not use illicit drugs.    Allergies: No Known Allergies   Medications Prior to Admission  Medication Sig Dispense Refill  . docusate sodium (COLACE) 100 MG capsule Take 100 mg by mouth daily.      . famotidine (PEPCID AC) 10 MG chewable tablet Chew 10 mg by mouth daily.      Marland Kitchen gabapentin (NEURONTIN) 300 MG capsule Take 300 mg by mouth 3 (three) times daily.      Marland Kitchen  HYDROcodone-acetaminophen (NORCO/VICODIN) 5-325 MG per tablet Take 1 tablet by mouth every 6 (six) hours as needed for moderate pain (every 8 hrs).      Marland Kitchen levothyroxine (SYNTHROID) 50 MCG tablet Take 50 mcg by mouth daily.      . Menthol, Topical Analgesic, (BIOFREEZE EX) Apply 1 application topically 3 (three) times daily as needed (for pain).      . pravastatin (PRAVACHOL) 20 MG tablet Take 20 mg by mouth at bedtime.       . tamsulosin  (FLOMAX) 0.4 MG CAPS capsule Take 0.4 mg by mouth daily.        Home: Home Living Family/patient expects to be discharged to:: Private residence Living Arrangements: Spouse/significant other Available Help at Discharge: Family;Available 24 hours/day Type of Home: House Home Access: Level entry Home Layout: One level Home Equipment: Cane - single point;Walker - 4 wheels;Hand held shower head;Shower seat - built in  Lives With: Spouse;Significant other;Other (Comment) [name])   Functional History: Prior Function Level of Independence: Independent with assistive device(s);Needs assistance Gait / Transfers Assistance Needed: using 4 [employer] ADL's / Homemaking Assistance Needed: (A) with setup of adls  Functional Status:  Mobility: Bed Mobility Overal bed mobility: Needs Assistance Bed Mobility: Supine to Sit;Sidelying to Sit;Rolling Rolling: Max assist (cues to sequence) Sidelying to sit: Mod assist Supine to sit: Mod assist Sit to supine: Min assist General bed mobility comments: mod cues for proper sequence for side to sit edge of bed. pt wanting to keep right hand under right leg due to pain,max cues to use that hand with bed mobiltiy. Transfers Overall transfer level: Needs assistance Equipment used: Rolling walker (2 wheeled) Transfers: Sit to/from Stand Sit to Stand: Min assist General transfer comment: cues for hand placement and ant weight shift with standing. no posterior LOB with stand from bed after cues were provided. Ambulation/Gait Ambulation/Gait assistance: Min assist Ambulation Distance (Feet): 20 Feet Assistive device: Rolling walker (2 wheeled) Gait Pattern/deviations: Trunk flexed;Narrow base of support;Step-to pattern;Antalgic;Decreased stance time - right;Decreased weight shift to right Gait velocity: decreased Gait velocity interpretation: Below normal speed for age/gender General Gait Details: bilateral knees flexed despite vc's. cues on posture, walker  position and for heel strike with intital contact with gait.    ADL: ADL Overall ADL's : Needs assistance/impaired Eating/Feeding: Modified independent;Sitting Grooming: Maximal assistance;Oral care;Cueing for safety;Cueing for sequencing;Standing Grooming Details (indicate cue type and reason): Pt with knee flexion and forward flexion at hips. pt needed constant cues at shoulder for upright posture and therapist helping support (A) sink level. pt requires max (A) to static stand for oral care. Pt unable to complete oral care completely due to fatigue and decr static standing. pt returned to sitting in chair then provided mouth wash Upper Body Bathing: Minimal assitance;Sitting Lower Body Bathing: Moderate assistance;Sit to/from stand Upper Body Dressing : Total assistance;Sitting Upper Body Dressing Details (indicate cue type and reason): pt total (A) to don back brace. pt with posterior lean and posterior pelvic tilt. Pt unable to sustain static sitting EOB.  Lower Body Dressing: Maximal assistance;Sit to/from stand Lower Body Dressing Details (indicate cue type and reason): pt don mesh underwear with pad to prevent incontinence with sit<>Stand Toilet Transfer: Moderate assistance;BSC;RW Functional mobility during ADLs: Moderate assistance;Rolling walker General ADL Comments: Pt remains with balance deficits, forward flexion at hips, severe pain in Rt LE and slow progress toward goals. Called CIR coordinator regarding recommendation. Pt s spouse very concered with confusion level and need for further workup  in [place] for blood work. Spouse provided paper and pen to document all questions and information to help advocate/ organzie information being provided.  Cognition: Cognition Overall Cognitive Status: Impaired/Different from baseline Orientation Level: Oriented X4;Other (comment) (Forgetful) Cognition Arousal/Alertness: Awake/alert Behavior During Therapy: Flat affect Overall  Cognitive Status: Impaired/Different from baseline Area of Impairment: Safety/judgement;Problem solving Orientation Level: Disoriented to;Time Memory: Decreased recall of precautions;Decreased short-term memory Safety/Judgement: Decreased awareness of deficits Problem Solving: Slow processing General Comments: Pt rolling blanket in hands and very restless. pt asked "do you need help?" pt stops looks at therapist and states "I dont know what I am doing. Was I folding it?"  Physical Exam: Blood pressure 98/61, pulse 107, temperature 98.7 F (37.1 C), temperature source Oral, resp. rate 18, height $RemoveBe'5\' 8"'VzywJguhD$  (1.727 m), weight 62.5 kg (137 lb 12.6 oz), SpO2 97.00%. Physical Exam  Nursing note and vitals reviewed.  Constitutional: He is oriented to person, place, and time. He appears well-developed and well-nourished.  HENT: oral mucosa pink/moist Head: Normocephalic and atraumatic.  Eyes: Conjunctivae are normal. Pupils are equal, round, and reactive to light.  Neck: Normal range of motion. Neck supple.  Cardiovascular: Normal rate and regular rhythm.  Respiratory: Effort normal and breath sounds normal. No respiratory distress. He has no wheezes.  GI: He exhibits distension. Bowel sounds are increased. There is no tenderness.  Neurological: He is alert and oriented to person, place, and time.  Speech dysarthric. Follows commands without difficulty. UES: RUE: deltoid 5/5, bicep 5/5, tricep 5/5, wrist ext 5/5, hand intrinsics 5/5 LUE: deltoid 5/5, bicep 5/5, triceps 5/5, wrist ext 5/5, hand instrincs 5/5 RLE: Hip 2/5 pain, Isolated KE 2/5. HAD 3+/5. Ankle 4+/5 LLE: Hip 3+, KE 4/5, ankle 4/5.  Sensory changes over the anterior-lateral thigh to right knee. DTR's 1+  Skin: Skin is warm and dry.surgical site intact M/S: back tender to touch and with proximal LE movement  Psychiatric: He has a normal mood and affect. His behavior is normal. Judgment and thought content normal   Results for orders  placed during the hospital encounter of 12/22/13 (from the past 48 hour(s))  BASIC METABOLIC PANEL     Status: Abnormal   Collection Time    12/25/13  1:47 PM      Result Value Ref Range   Sodium 129 (*) 137 - 147 mEq/L   Potassium 5.2  3.7 - 5.3 mEq/L   Chloride 93 (*) 96 - 112 mEq/L   CO2 19  19 - 32 mEq/L   Glucose, Bld 163 (*) 70 - 99 mg/dL   BUN 69 (*) 6 - 23 mg/dL   Creatinine, Ser 2.84 (*) 0.50 - 1.35 mg/dL   Calcium 10.8 (*) 8.4 - 10.5 mg/dL   GFR calc non Af Amer 21 (*) >90 mL/min   GFR calc Af Amer 24 (*) >90 mL/min   Comment: (NOTE)     The eGFR has been calculated using the CKD EPI equation.     This calculation has not been validated in all clinical situations.     eGFR's persistently <90 mL/min signify possible Chronic Kidney     Disease.   Anion gap 17 (*) 5 - 15   No results found.     Medical Problem List and Plan: 1. Functional deficits secondary to  Rigth L3 radiculopathy , L3 compression fx s/p decompression/kyphoplasty 2.  DVT Prophylaxis/Anticoagulation: Pharmaceutical: Lovenox 3. Pain Management:  Decrease Gabapentin to renal dose. Limit narcotics as continues to have bouts of  lethargy (as well as confusion per partner) 4. Anxiety disorder/Mood:  Delirium appears to be resolving--sleepy this am and reports of hallucinations last night.  Team to provide ego support. LCSW to follow for evaluation and support.  5. Neuropsych: This patient is capable of making decisions on her  own behalf. 6. Skin/Wound Care: Pressure relief measures. Add nutritional supplement. May need IVF for hydration.  7. Acute on chronic renal failure: Chronic Hyponatremia with Na-128, BUN/Cr- 39/2.42 at admission. Need to push po fluids. Discontinue fluid restrictions. Continue renal diet.  Strict I and O with I/O caths to keep bladder decompressed. H/o BPH on recent renal u/s.  8. Constipation: Will need aggressive bowel program. Change colace to Miralax. SSE today.  9. H/o BPH: Increase  Flomax to 0.8 mg. Has seen Dr Risa Grill few months ago.  10. Renal amyloidosis: Follow up with nephrology past discharge.  11. Urinary retention: Multifactorial due to recent surgery, immobility, narcotic use as well as BPH. Will check UA/UCS to rule out UTI.  12.  Odynophagia: Tongue edematous with evidence of thrush. Will add diflucan for treatment.   Post Admission Physician Evaluation: 1. Functional deficits secondary  to L3 compression fx with right L3 radiculopathy. 2. Patient is admitted to receive collaborative, interdisciplinary care between the physiatrist, rehab nursing staff, and therapy team. 3. Patient's level of medical complexity and substantial therapy needs in context of that medical necessity cannot be provided at a lesser intensity of care such as a SNF. 4. Patient has experienced substantial functional loss from his/her baseline which was documented above under the "Functional History" and "Functional Status" headings.  Judging by the patient's diagnosis, physical exam, and functional history, the patient has potential for functional progress which will result in measurable gains while on inpatient rehab.  These gains will be of substantial and practical use upon discharge  in facilitating mobility and self-care at the household level. 5. Physiatrist will provide 24 hour management of medical needs as well as oversight of the therapy plan/treatment and provide guidance as appropriate regarding the interaction of the two. 6. 24 hour rehab nursing will assist with bladder management, bowel management, safety, skin/wound care, disease management, medication administration, pain management and patient education  and help integrate therapy concepts, techniques,education, etc. 7. PT will assess and treat for/with: Lower extremity strength, range of motion, stamina, balance, functional mobility, safety, adaptive techniques and equipment, NMR, pain mgt, back precautions, core strengthening.    Goals are: mod I to supervision. 8. OT will assess and treat for/with: ADL's, functional mobility, safety, upper extremity strength, adaptive techniques and equipment, NMR, pain mgt, back precautions, caregiver education.   Goals are: mod I to set up. 9. SLP will assess and treat for/with: n/a.  Goals are: n/a. 10. Case Management and Social Worker will assess and treat for psychological issues and discharge planning. 11. Team conference will be held weekly to assess progress toward goals and to determine barriers to discharge. 12. Patient will receive at least 3 hours of therapy per day at least 5 days per week. 13. ELOS: 12-15 days       14. Prognosis:  excellent     Meredith Staggers, MD, [facility]   12/27/2013

## 2013-12-27 NOTE — Progress Notes (Signed)
Physical Therapy Treatment Patient Details Name: Lance [name] MRN: [id number] DOB: Nov 18, 1940 Today's Date: 12/27/2013    History of Present Illness 73 yo male s/p L2-3 decompression laminotomy with kyphoplasty vertebroplasty. PMH: RTC Rt, Rt direct anterior THP, hand surgery, weight lose 20 pounds, arthritis, Gerd, anxiety    PT Comments    Pt is progressing slowly, but daily with his mobility.  He continues to report 10/10 pain in his back and WB on his right foot.  Back precaution education was reinforced as well as the importance of posture during gait.  PT will continue to follow acutely and he continues to be an excellent inpatient rehab candidate at discharge.    Follow Up Recommendations  CIR;Supervision/Assistance - 24 hour     Equipment Recommendations  None recommended by PT    Recommendations for Other Services   NA     Precautions / Restrictions Precautions Precautions: Back Precaution Comments: pt only able to report 1/3 precautions, reviewed verbally Required Braces or Orthoses: Spinal Brace Spinal Brace: Lumbar corset;Applied in sitting position    Mobility  Bed Mobility Overal bed mobility: Needs Assistance Bed Mobility: Rolling;Sidelying to Sit Rolling: Min assist Sidelying to sit: Min assist       General bed mobility comments: Min assist to support legs in flexion while pt is rolling to his side.  Pt using bil arms on bed rail to manage his upper extremities and trunk.  Min assist to support trunk to get to sitting from sidelying, pt again, heavily using railing for transition.   Transfers Overall transfer level: Needs assistance Equipment used: Rolling walker (2 wheeled) Transfers: Sit to/from Stand Sit to Stand: Min assist         General transfer comment: Verbal cues for safe hand placement during tranisitons. Min assist to support trunk over flexed knees.   Ambulation/Gait Ambulation/Gait assistance: Min assist Ambulation Distance  (Feet): 25 Feet Assistive device: Rolling walker (2 wheeled) Gait Pattern/deviations: Shuffle;Trunk flexed Gait velocity: decreased   General Gait Details: Pt with shufflling, flexed knee gait pattern, verbal cues for upright posture.  Min assist to support trunk over weak legs and help steer RW.            Balance Overall balance assessment: Needs assistance Sitting-balance support: Feet supported;Bilateral upper extremity supported Sitting balance-Leahy Scale: Fair     Standing balance support: Bilateral upper extremity supported Standing balance-Leahy Scale: Poor                      Cognition Arousal/Alertness: Awake/alert Behavior During Therapy: WFL for tasks assessed/performed         Memory: Decreased recall of precautions                     Pertinent Vitals/Pain Pain Assessment: 0-10 Pain Score: 10-Worst pain ever Pain Location: low back and right leg Pain Descriptors / Indicators: Burning Pain Intervention(s): Limited activity within patient's tolerance;Monitored during session;Repositioned    Home Living     Available Help at Discharge: Family;Available 24 hours/day Type of Home: House              Prior Function        Comments: walker is a borrowed walker he recently has been using only over last month. previously drove self, worked out weekly and maintained yard   PT Goals (current goals can now be found in the care plan section) Acute Rehab PT Goals Patient Stated Goal: To return home  and improve overall ambulation with decrease pain Progress towards PT goals: Progressing toward goals    Frequency  Min 5X/week    PT Plan Current plan remains appropriate       End of Session Equipment Utilized During Treatment: Gait belt;Back brace Activity Tolerance: Patient limited by pain Patient left: in chair;with call bell/phone within reach;with family/visitor present     Time: 1610-9604 PT Time Calculation (min): 22  min  Charges:  $Gait Training: 8-22 mins                      Bevely Hackbart B. Lillar Bianca, PT, DPT [phone]   12/27/2013, 11:06 AM

## 2013-12-27 NOTE — Progress Notes (Signed)
PMR Admission Coordinator Pre-Admission Assessment  Patient: Lance [name] is an 73 y.o., male  MRN: [id number]  DOB: [birth year]  Height: 5' 8"  (172.7 cm)  Weight: 62.5 kg (137 lb 12.6 oz)  Insurance Information  HMO: PPO: PCP: IPA: 80/20: yes OTHER: no HMO  PRIMARY: Medicare a and b Policy#: [id number] Subscriber: pt  Benefits: Phone #: palmetto online Name: 12/26/13  Eff. Date: 08/03/07 Deduct: $1260 Out of Pocket Max: none Life Max: none  CIR: 100% SNF: 20 full days  Outpatient: 80% Co-Pay: 20%  Home Health: 100% Co-Pay: none  DME: 80% Co-Pay: 20%  Providers: pt choice   SECONDARY: Lance [name] Policy#: [id number] Subscriber: pt  Medicaid Application Date: Case Manager:  Disability Application Date: Case Worker:  Emergency Contact Information  Contact Information    Name  Relation  Home  Work  Mobile    [place]  Significant other  [phone]   [phone]      Current Medical History  Patient Admitting Diagnosis:Rigth L3 radic, L3 compression fx s/p decompression/kyphoplasty  History of Present Illness: Lance [name] is a 73 y.o. male with history of CKD, anxiety disorder, recent 20 lbs weight loss, Lifting injury 2 months ago with subsequent L3 compression Fx with stenosis and L3 radiculopathy.  He has failed conservative treatment and elected to undergo L2/3 decompressive laminectomy with foraminotomy and open kyphoplasty vertebroplasty by Dr. Annette Stable on 12/22/13. Bone biopsy negative for tumor and patient with recent dx of renal amyloidosis. Post op has had issues with urinary retention, acute on chronic renal failure as well as confusion.  Pathology of his L3 fractured vertebra is negative for tumor. Pathology done at an outside institution for his renal biopsy is consistent with amyloidosis.With regard to the patient's new diagnosis of renal amyloidosis Dr. Annette Stable discussed situation with the patient's nephrologist and consults and are hematology oncology group. Plan is for  him to be followed as an outpatient for this. No acute treatment or further workup is indicated as the patient's L3 vertebral body biopsy will serve as his bone marrow biopsy.  Past Medical History  Past Medical History   Diagnosis  Date   .  GERD (gastroesophageal reflux disease)    .  Arthritis      osteoarthritis   .  Hypothyroidism    .  Chronic kidney disease      bph,kidney bx,renal insuff.   .  Anxiety    .  Weight decrease      20lbs    Family History  family history is not on file.  Prior Rehab/Hospitalizations: HH after hip replacement  Current Medications  Current facility-administered medications:0.9 % sodium chloride infusion, 250 mL, Intravenous, Continuous, Charlie Pitter, MD; 0.9 % sodium chloride infusion, , Intravenous, Once, Hosie Spangle, MD; acetaminophen (TYLENOL) suppository 650 mg, 650 mg, Rectal, Q4H PRN, Charlie Pitter, MD; acetaminophen (TYLENOL) tablet 650 mg, 650 mg, Oral, Q4H PRN, Charlie Pitter, MD  alum & mag hydroxide-simeth (MAALOX/MYLANTA) 200-200-20 MG/5ML suspension 30 mL, 30 mL, Oral, Q6H PRN, Charlie Pitter, MD; bisacodyl (DULCOLAX) suppository 10 mg, 10 mg, Rectal, Daily PRN, Charlie Pitter, MD, 10 mg at 12/26/13 1052; cyclobenzaprine (FLEXERIL) tablet 10 mg, 10 mg, Oral, TID PRN, Charlie Pitter, MD, 10 mg at 12/27/13 0119; docusate sodium (COLACE) capsule 100 mg, 100 mg, Oral, Daily, Charlie Pitter, MD, 100 mg at 12/26/13 1043  famotidine (PEPCID) tablet 10 mg, 10 mg, Oral, Daily, Charlie Pitter, MD, 10 mg at 12/26/13  1043; feeding supplement (NEPRO CARB STEADY) liquid 237 mL, 237 mL, Oral, Q24H, Hazle Coca, RD, 237 mL at 12/26/13 1416; gabapentin (NEURONTIN) capsule 300 mg, 300 mg, Oral, TID, Charlie Pitter, MD, 300 mg at 12/26/13 2253  HYDROcodone-acetaminophen (NORCO/VICODIN) 5-325 MG per tablet 1 tablet, 1 tablet, Oral, Q4H PRN, Elaina Hoops, MD, 1 tablet at 12/27/13 0119; HYDROmorphone (DILAUDID) injection 0.5-1 mg, 0.5-1 mg, Intravenous, Q2H PRN, Charlie Pitter, MD, 1 mg at 12/22/13 2004; levothyroxine (SYNTHROID, LEVOTHROID) tablet 50 mcg, 50 mcg, Oral, QAC breakfast, Charlie Pitter, MD, 50 mcg at 12/27/13 0840  menthol-cetylpyridinium (CEPACOL) lozenge 3 mg, 1 lozenge, Oral, PRN, Charlie Pitter, MD; ondansetron [facility]) injection 4 mg, 4 mg, Intravenous, Q4H PRN, Charlie Pitter, MD, 4 mg at 12/26/13 1544; pantoprazole (PROTONIX) EC tablet 40 mg, 40 mg, Oral, BID, [facility], [place], 40 mg at 12/26/13 2253; phenol (CHLORASEPTIC) mouth spray 1 spray, 1 spray, Mouth/Throat, PRN, Charlie Pitter, MD  polyethylene glycol (MIRALAX / GLYCOLAX) packet 17 g, 17 g, Oral, Daily PRN, Charlie Pitter, MD, 17 g at 12/26/13 0827; senna (SENOKOT) tablet 8.6 mg, 1 tablet, Oral, BID, Charlie Pitter, MD, 8.6 mg at 12/26/13 2253; simvastatin (ZOCOR) tablet 10 mg, 10 mg, Oral, q1800, Charlie Pitter, MD, 10 mg at 12/26/13 0932; sodium chloride 0.9 % injection 3 mL, 3 mL, Intravenous, Q12H, Charlie Pitter, MD, 3 mL at 12/26/13 2253  sodium chloride 0.9 % injection 3 mL, 3 mL, Intravenous, PRN, Charlie Pitter, MD; tamsulosin [facility]) capsule 0.4 mg, 0.4 mg, Oral, Daily, Charlie Pitter, MD, 0.4 mg at 12/26/13 1043  Patients Current Diet: Renal  Precautions / Restrictions  Precautions  Precautions: Back  Precaution Booklet Issued: No  Precaution Comments: reviewed precautions  Spinal Brace: Lumbar corset;Applied in sitting position  Restrictions  Weight Bearing Restrictions: No  Prior Activity Level  Limited Community (1-2x/wk): pt active until 3 months ago with greatest decline in function over past 4 weeks  Development worker, international aid / [place] Devices/Equipment: Eyeglasses;Walker (specify type)  Home Equipment: Cane - single point;Walker - 4 wheels;Hand held shower head;Shower seat - built in  Prior Functional Level  Prior Function  Level of Independence: Independent with assistive device(s);Needs assistance  Gait / Transfers Assistance Needed: using 4 [employer]  ADL's /  Homemaking Assistance Needed: (A) with setup of adls  Comments: walker is a borrowed walker he recently has been using only over last month. previously drove self, worked out weekly and maintained yard  Current Functional Level  Cognition  Overall Cognitive Status: Impaired/Different from baseline  Orientation Level: Oriented X4;Other (comment) (Forgetful)  Safety/Judgement: Decreased awareness of deficits  General Comments: Pt rolling blanket in hands and very restless. pt asked "do you need help?" pt stops looks at therapist and states "I dont know what I am doing. Was I folding it?"   Extremity Assessment  (includes Sensation/Coordination)     ADLs  Overall ADL's : Needs assistance/impaired  Eating/Feeding: Modified independent;Sitting  Grooming: Maximal assistance;Oral care;Cueing for safety;Cueing for sequencing;Standing  Grooming Details (indicate cue type and reason): Pt with knee flexion and forward flexion at hips. pt needed constant cues at shoulder for upright posture and therapist helping support (A) sink level. pt requires max (A) to static stand for oral care. Pt unable to complete oral care completely due to fatigue and decr static standing. pt returned to sitting in chair then provided mouth wash  Upper Body Bathing: Minimal  assitance;Sitting  Lower Body Bathing: Moderate assistance;Sit to/from stand  Upper Body Dressing : Total assistance;Sitting  Upper Body Dressing Details (indicate cue type and reason): pt total (A) to don back brace. pt with posterior lean and posterior pelvic tilt. Pt unable to sustain static sitting EOB.  Lower Body Dressing: Maximal assistance;Sit to/from stand  Lower Body Dressing Details (indicate cue type and reason): pt don mesh underwear with pad to prevent incontinence with sit<>Stand  Toilet Transfer: Moderate assistance;BSC;RW  Functional mobility during ADLs: Moderate assistance;Rolling walker  General ADL Comments: Pt remains with balance  deficits, forward flexion at hips, severe pain in Rt LE and slow progress toward goals. Called CIR coordinator regarding recommendation. Pt s spouse very concered with confusion level and need for further workup in [place] for blood work. Spouse provided paper and pen to document all questions and information to help advocate/ organzie information being provided.   Mobility  Overal bed mobility: Needs Assistance  Bed Mobility: Supine to Sit;Sidelying to Sit;Rolling  Rolling: Max assist (cues to sequence)  Sidelying to sit: Mod assist  Supine to sit: Mod assist  Sit to supine: Min assist  General bed mobility comments: mod cues for proper sequence for side to sit edge of bed. pt wanting to keep right hand under right leg due to pain,max cues to use that hand with bed mobiltiy.   Transfers  Overall transfer level: Needs assistance  Equipment used: Rolling walker (2 wheeled)  Transfers: Sit to/from Stand  Sit to Stand: Min assist  General transfer comment: cues for hand placement and ant weight shift with standing. no posterior LOB with stand from bed after cues were provided.   Ambulation / Gait / Stairs / Wheelchair Mobility  Ambulation/Gait  Ambulation/Gait assistance: Fish farm manager (Feet): 20 Feet  Assistive device: Rolling walker (2 wheeled)  Gait Pattern/deviations: Trunk flexed;Narrow base of support;Step-to pattern;Antalgic;Decreased stance time - right;Decreased weight shift to right  Gait velocity: decreased  Gait velocity interpretation: Below normal speed for age/gender  General Gait Details: bilateral knees flexed despite vc's. cues on posture, walker position and for heel strike with intital contact with gait.   Posture / Balance  Dynamic Sitting Balance  Sitting balance - Comments: posterior lean and pelvic tilt   Special needs/care consideration  Bowel mgmt: BM 8/25. Overall constipated since admission  Bladder mgmt: I and O caths since surgery. PTA had  urgency and used bathroom 3 to 4 times per night. Difficulty with flow of urine   Previous Home Environment  Living Arrangements: Spouse/significant other  Lives With: Spouse;Significant other;Other (Comment) Lance [name])  Available Help at Discharge: Family;Available 24 hours/day  Type of Home: House  Home Layout: One level  Home Access: Level entry  Bathroom Shower/Tub: Walk-in shower;Door  [employer] Toilet: Handicapped height  Bathroom Accessibility: Yes  How Accessible: Accessible via walker  Home Care Services: No  Discharge Living Setting  Plans for Discharge Living Setting: Patient's home;Lives with (comment);Other (Comment) (spouse, Lance [name])  Type of Home at Discharge: House  Discharge Home Layout: One level  Discharge Home Access: Level entry  Discharge Bathroom Shower/Tub: Walk-in shower  Discharge Bathroom Toilet: Handicapped height  Discharge Bathroom Accessibility: Yes  How Accessible: Accessible via walker  Does the patient have any problems obtaining your medications?: No  Social/Family/Support Systems  Patient Roles: Partner;Spouse  Contact Information: Lance [name], spouse  Anticipated Caregiver: spouse, Lance [name]  Anticipated Caregiver's Contact Information: h [phone]; cell [phone]  Ability/Limitations of Caregiver: none  Caregiver Availability: 24/7  Discharge Plan Discussed with Primary Caregiver: Yes  Is Caregiver In Agreement with Plan?: Yes  Does Caregiver/Family have Issues with Lodging/Transportation while Pt is in Rehab?: No  Goals/Additional Needs  Patient/Family Goal for Rehab: Mod I PT and OT  Expected length of stay: ELOS 9-13 days  Special Service Needs: pt confused this admission which is unlike him per Lance [name], he feels steroids contributing to his confusion  Pt/Family Agrees to Admission and willing to participate: Yes  Program Orientation Provided & Reviewed with Pt/Caregiver Including Roles & Responsibilities: Yes  Decrease burden of Care through IP  rehab admission: n/a  Possible need for SNF placement upon discharge:not anticipated  Patient Condition: This patient's condition remains as documented in the consult dated 12/26/2013, in which the Rehabilitation Physician determined and documented that the patient's condition is appropriate for intensive rehabilitative care in an inpatient rehabilitation facility. Will admit to inpatient rehab today.  Preadmission Screen Completed By: Cleatrice Burke, 12/27/2013 9:31 AM  ______________________________________________________________________  Discussed status with Dr. Naaman Plummer on 12/27/2013 at [phone] and received telephone approval for admission today.  Admission Coordinator: Cleatrice Burke, time 4037 Date 12/27/2013.  Cosigned by: Meredith Staggers, MD [12/27/2013 9:41 AM]

## 2013-12-27 NOTE — Progress Notes (Signed)
Pt had been voiding during shift but noted to be retaining and not completely emptying out bladder. Pt bladder noted to be distended; pt bladder scanned for more than 612; pt in and out cath for . Pt voices relieve. Pt being transported to CIR via bed with belongings at side. Will inform nurse Whitney of pt I&O cath. Arabella Merles Kolyn Rozario RN.

## 2013-12-28 ENCOUNTER — Inpatient Hospital Stay (HOSPITAL_COMMUNITY): Payer: Medicare Other

## 2013-12-28 ENCOUNTER — Inpatient Hospital Stay (HOSPITAL_COMMUNITY): Payer: Medicare Other | Admitting: Rehabilitation

## 2013-12-28 LAB — CBC WITH DIFFERENTIAL/PLATELET
Basophils Absolute: 0 10*3/uL (ref 0.0–0.1)
Basophils Relative: 0 % (ref 0–1)
Eosinophils Absolute: 0.5 10*3/uL (ref 0.0–0.7)
Eosinophils Relative: 4 % (ref 0–5)
HCT: 32.6 % — ABNORMAL LOW (ref 39.0–52.0)
Hemoglobin: 10.7 g/dL — ABNORMAL LOW (ref 13.0–17.0)
LYMPHS ABS: 1.8 10*3/uL (ref 0.7–4.0)
Lymphocytes Relative: 14 % (ref 12–46)
MCH: 28.7 pg (ref 26.0–34.0)
MCHC: 32.8 g/dL (ref 30.0–36.0)
MCV: 87.4 fL (ref 78.0–100.0)
MONO ABS: 1 10*3/uL (ref 0.1–1.0)
MONOS PCT: 8 % (ref 3–12)
Neutro Abs: 9.7 10*3/uL — ABNORMAL HIGH (ref 1.7–7.7)
Neutrophils Relative %: 74 % (ref 43–77)
PLATELETS: 483 10*3/uL — AB (ref 150–400)
RBC: 3.73 MIL/uL — ABNORMAL LOW (ref 4.22–5.81)
RDW: 15.1 % (ref 11.5–15.5)
Smear Review: INCREASED
WBC: 13 10*3/uL — ABNORMAL HIGH (ref 4.0–10.5)

## 2013-12-28 LAB — URINE CULTURE
Colony Count: NO GROWTH
Culture: NO GROWTH

## 2013-12-28 LAB — COMPREHENSIVE METABOLIC PANEL
ALT: 22 U/L (ref 0–53)
ANION GAP: 17 — AB (ref 5–15)
AST: 31 U/L (ref 0–37)
Albumin: 2.6 g/dL — ABNORMAL LOW (ref 3.5–5.2)
Alkaline Phosphatase: 216 U/L — ABNORMAL HIGH (ref 39–117)
BUN: 88 mg/dL — AB (ref 6–23)
CALCIUM: 10.4 mg/dL (ref 8.4–10.5)
CO2: 20 mEq/L (ref 19–32)
Chloride: 99 mEq/L (ref 96–112)
Creatinine, Ser: 2.86 mg/dL — ABNORMAL HIGH (ref 0.50–1.35)
GFR calc non Af Amer: 21 mL/min — ABNORMAL LOW (ref >90)
GFR, EST AFRICAN AMERICAN: 24 mL/min — AB (ref >90)
GLUCOSE: 86 mg/dL (ref 70–99)
Potassium: 5.4 mEq/L — ABNORMAL HIGH (ref 3.7–5.3)
Sodium: 136 mEq/L — ABNORMAL LOW (ref 137–147)
TOTAL PROTEIN: 6.3 g/dL (ref 6.0–8.3)
Total Bilirubin: 0.5 mg/dL (ref 0.3–1.2)

## 2013-12-28 MED ORDER — RENA-VITE PO TABS
1.0000 | ORAL_TABLET | Freq: Every day | ORAL | Status: DC
Start: 2013-12-28 — End: 2014-01-06
  Administered 2013-12-28 – 2014-01-05 (×9): 1 via ORAL
  Filled 2013-12-28 (×10): qty 1

## 2013-12-28 MED ORDER — NEPRO/CARBSTEADY PO LIQD
237.0000 mL | Freq: Two times a day (BID) | ORAL | Status: DC
  Administered 2013-12-29 – 2014-01-03 (×10): 237 mL via ORAL

## 2013-12-28 MED ORDER — BIOTENE DRY MOUTH MT LIQD
15.0000 mL | Freq: Two times a day (BID) | OROMUCOSAL | Status: DC
  Administered 2013-12-29 – 2014-01-06 (×16): 15 mL via OROMUCOSAL

## 2013-12-28 MED ORDER — LIDOCAINE HCL 2 % EX GEL
1.0000 "application " | Freq: Once | CUTANEOUS | Status: AC
  Administered 2013-12-28: 1 via URETHRAL
  Filled 2013-12-28: qty 5

## 2013-12-28 NOTE — Progress Notes (Signed)
Physical Therapy Session Note  Patient Details  Name: Lance [name] MRN: [id number] Date of Birth: 11/13/1940  Today's Date: 12/28/2013 PT Individual Time: 1505-1605 PT Individual Time Calculation (min): 60 min   Short Term Goals: Week 1:  PT Short Term Goal 1 (Week 1): patient will perform bed mobility with rails and supervision PT Short Term Goal 2 (Week 1): patient will perform sit to stand and stand pivot transfer bed <> wheelchair with supervsion PT Short Term Goal 3 (Week 1): Patient will propel wheelchair 100 feet with supervision in comtrolled environment PT Short Term Goal 4 (Week 1): Patient will ambuate 75 feet with rolling walker and min steady assist in controlled environment  Skilled Therapeutic Interventions/Progress Updates:  Pt received lying in bed this afternoon, agreeable to therapy, despite 10/10 pain in RLE.  RN notified and provided mm relaxer prior to session.  Performed bed mobility at mod A for supine<>sit for assist with trunk when sitting up and for LEs when lying down.  Cues for log rolling technique to maintain back precautions.  Donned lumbar brace while on EOB at total A.  Transferred bed <>w/c with RW at min A level.   Pt with very forward flexed posture throughout with max verbal cues for upright posture and increased knee ext.  Had pt self propel w/c to therapy gym x 150' using LLE and BUEs only due to increased pain in RLE.  Requires intermittent min A and max verbal cues for correct propulsion technique.  Once in therapy gym performed seated LAQ's x 10 reps BLE and marching x 10 reps BLEs prior to gait.  Performed 25' x 1 with RW at min A level with cues for upright posture and increased quad control during R stance.  Performed 5 reps of sit<>stand without use of RW in order to work on quad and glute strength in BLEs.  Performed at min/mod A level.  Ended session with seated nu step x 4 mins at level 1 resistance with BLEs only to increase overall strength and  endurance.  Pt tolerated, however with continued pain in RLE.  Transferred back to chair as stated above and to bed once in room.  Pt left in room in bed with all needs in reach and bed alarm set.  Friend in room with him.    Therapy Documentation Precautions:  Precautions Precautions: Back;Fall Precaution Comments: pt only able to report 1/3 precautions, reviewed verbally Required Braces or Orthoses: Spinal Brace Spinal Brace: Lumbar corset;Applied in sitting position Restrictions Weight Bearing Restrictions: No General: Chart Reviewed: Yes Family/Caregiver Present: Yes Vital Signs: Therapy Vitals Temp: 98.3 F (36.8 C) Temp src: Oral Pulse Rate: 99 Resp: 18 BP: 101/61 mmHg Patient Position (if appropriate): Lying Oxygen Therapy SpO2: 97 % O2 Device: None (Room air) Pain: Pt 10/10 pain in RLE, agreeable to therapy and RN provided mm relaxer during session.  See FIM for current functional status  Therapy/Group: Individual Therapy  Vista Deck 12/28/2013, 4:35 PM

## 2013-12-28 NOTE — Plan of Care (Signed)
Problem: SCI BLADDER ELIMINATION Goal: RH STG MANAGE BLADDER WITH ASSISTANCE STG Manage Bladder With mod Assistance  Outcome: Not Progressing Foley coude catheter replaced after multiple intermittent catheterizations resulting in blood.

## 2013-12-28 NOTE — Progress Notes (Signed)
[facility]patient [facility] Individual Statement of Services  Patient Name:  Lance [name]  Date:  12/28/2013  Welcome to the [facility]patient [facility].  Our goal is to provide you with an individualized program based on your diagnosis and situation, designed to meet your specific needs.  With this comprehensive rehabilitation program, you will be expected to participate in at least 3 hours of rehabilitation therapies Monday-Friday, with modified therapy programming on the weekends.  Your rehabilitation program will include the following services:  Physical Therapy (PT), Occupational Therapy (OT), 24 hour per day rehabilitation nursing, Case Management (Social Worker), Rehabilitation Medicine, Nutrition Services and Pharmacy Services  Weekly team conferences will be held on Tuesdays to discuss your progress.  Your Social Worker will talk with you frequently to get your input and to update you on team discussions.  Team conferences with you and your family in attendance may also be held.  Expected length of stay:  12 - 14 days  Overall anticipated outcome:  Supervision  Depending on your progress and recovery, your program may change. Your Social Worker will coordinate services and will keep you informed of any changes. Your Social Worker's name and contact numbers are listed  below.  The following services may also be recommended but are not provided by the Inpatient Rehabilitation Center:   Driving Evaluations  Home Health Rehabiltiation Services  Outpatient Rehabilitation Services   Arrangements will be made to provide these services after discharge if needed.  Arrangements include referral to agencies that provide these services.  Your insurance has been verified to be:  Medicare and [employer] Your primary doctor is:  Dr. Antony Haste  Pertinent information will be shared with your doctor and your insurance company.  Social Worker:  Staci Acosta, LCSW  [phone] or ([phone]  Information discussed with and copy given to patient by: Elvera Lennox, 12/28/2013, 2:04 PM

## 2013-12-28 NOTE — Progress Notes (Signed)
Social Work Assessment and Plan  Patient Details  Name: Lance [name] MRN: [id number] Date of Birth: 1940-05-10  Today's Date: 12/28/2013  Problem List:  Patient Active Problem List   Diagnosis Date Noted  . Myelopathy of lumbar region 12/27/2013  . Malnutrition of moderate degree 12/25/2013  . Closed lumbar vertebral fracture 12/22/2013  . Spinal stenosis of lumbar region 12/22/2013  . Lumbar vertebral fracture 12/22/2013  . Degenerative arthritis of hip 05/08/2011   Past Medical History:  Past Medical History  Diagnosis Date  . GERD (gastroesophageal reflux disease)   . Arthritis     osteoarthritis  . Hypothyroidism   . Chronic kidney disease     bph,kidney bx,renal insuff.  . Anxiety   . Weight decrease     20lbs   Past Surgical History:  Past Surgical History  Procedure Laterality Date  . Rotator cuff repair  2004    right rotator cuff repair  . 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 Hand surgery    . Renal biopsy    . Laminectomy      L2 L3  WITH OPEN KYPHOPLASTY  . Lumbar laminectomy/decompression microdiscectomy Right 12/22/2013    Procedure: Right L/2-3LUMBAR DECOMPRESSION 1 LEVEL ;  Surgeon: Charlie Pitter, MD;  Location: [facility];  Service: Neurosurgery;  Laterality: Right;  Right L/2-3LUMBAR DECOMPRESSION 1 LEVEL   Social History:  reports that he has never smoked. He has never used smokeless tobacco. He reports that he drinks alcohol. He reports that he does not use illicit drugs.  Family / Support Systems Marital Status: Married How Long?: 2 1/2 months (together almost 25 years) Patient Roles: Spouse Spouse/Significant Other: Lance [name] - spouse - [phone] (c) Other Supports: friends/extended family Anticipated  Caregiver: Lance [name] Ability/Limitations of Caregiver: none Caregiver Availability: 24/7 Family Dynamics: close, supportive relationship with Lance [name]  Social History Preferred language: English Religion:  Education: high school Read: Yes Write: Yes Employment Status: Retired Date Retired/Disabled/Unemployed: 2001 Age Retired: 59 Freight forwarder Issues: None reported Guardian/Conservator: N/A   Abuse/Neglect Physical Abuse: Denies Verbal Abuse: Denies Sexual Abuse: Denies Exploitation of patient/patient's resources: Denies Self-Neglect: Denies  Emotional Status Pt's affect, behavior and adjustment status: Pt reports feeling down/depressed/frustrated with his situation, but he remains hopeful for recovery.  He feels he is coping with his feelings currently and will let CSW know if things worsen.  Pt is not currently anxious. Recent Psychosocial Issues: Pt's spouse had an accident last fall, they moved homes, pt had hand surgery in May, they married in June, then pt with new renal diagnosis, and now back surgery. Psychiatric History: Pt's chart reports a history of anxiety, but pt denied any anxiety or depression in the past. Substance Abuse History: None reported  Patient / Family Perceptions, Expectations & Goals Pt/Family understanding of illness & functional limitations: Pt/spouse have a good understanding of pt's condition and feel their questions have been answered along the way. Premorbid pt/family roles/activities: Pt enjoys traveling, going to eat, spending time with Lance [name], exercise class 3-4 times/week. Anticipated changes in roles/activities/participation: Pt wants to return to doing the things he enjoys as soon as he is able. Pt/family expectations/goals: Pt wants to get back to doing the things he is used to doing.  Was independent prior to the last couple of  months.  Community [employer] Agencies: None Premorbid Home Care/DME Agencies: Other (Comment) (home  health after hip replacement-agency unknown) Transportation available at discharge: spouse Resource referrals recommended: Neuropsychology  Discharge Planning Living Arrangements: Spouse/significant other Support Systems: Spouse/significant other;Friends/neighbors Type of Residence: Private residence Insurance Resources: Education officer, museum (specify) ([place] secondary) Financial Resources: Fish farm manager;Other (Comment) (retirement funds) Financial Screen Referred: No Living Expenses: Own Money Management: Patient;Spouse Does the patient have any problems obtaining your medications?: No Home Management: Pt's spouse can manage the home while pt is recovering.  He will get help if he needs it. Patient/Family Preliminary Plans: Pt plans to return to his home with Lance [name] to provide assistance, as needed.  He will get extra help if he needs it. Social Work Anticipated Follow Up Needs: HH/OP Expected length of stay: [facility] 9-13 days  Clinical Impression CSW met with pt and then later pt's spouse Lance [name]) to introduce self and CSW role, as well as to complete assessment.  Pt has good support from [place] who plans to care for pt at home.  He will be able to arrange more help if needed.  Pt reported to CSW that his situation is depressing and that he is still having pain and that he feels the surgery hasn't changed the pain.  However, pt is hopeful for recovery and is motivated to get moving again and does not feel he has depression or frustration to the level that it is impacting his function.  CSW told him that we would keep in touch about this and encouraged him to let CSW or other staff know if he begins to feel poorly.  He said he would.  Pt has handicapped height toilet at home, built in shower seat, and grab bars in bathroom.  Home is level with a level entry.  It is a townhome, so pt/[name] are not responsible for keeping up exterior of home.  Lance [name] is very appreciative of pt's acceptance to CIR and  is hopeful that he will regain strength and function while here.  No current concerns/questions/needs at this time.  CSW will continue to follow pt and assist as needed.  Deshay Kirstein, Silvestre Mesi 12/28/2013, 11:52 AM

## 2013-12-28 NOTE — Interval H&P Note (Signed)
Lance [name] was admitted today to Inpatient Rehabilitation with the diagnosis of L3 fx and radiculopathy. The patient's history has been reviewed, patient examined, and there is no change in status.  Patient continues to be appropriate for intensive inpatient rehabilitation.  I have reviewed the patient's chart and labs.  Questions were answered to the patient's satisfaction.[  This encounter and document were completed on 12/27/13. The H&P is now being placed in the rehab encounter for the purpose of charting.  SWARTZ,ZACHARY T 12/28/2013, 6:17 AM

## 2013-12-28 NOTE — Progress Notes (Signed)
[facility] PHYSICAL MEDICINE & REHABILITATION     PROGRESS NOTE    Subjective/Complaints: Moved bowels last night. Had urine retention---nursing couldn't pass standard cath---now receiving IC with coude  Objective: Vital Signs: Blood pressure 111/60, pulse 116, temperature 99.3 F (37.4 C), temperature source Oral, resp. rate 19, SpO2 92.00%. No results found.  Recent Labs  12/28/13 0630  WBC 13.0*  HGB 10.7*  HCT 32.6*  PLT 483*    Recent Labs  12/25/13 1347 12/28/13 0630  NA 129* 136*  K 5.2 5.4*  CL 93* 99  GLUCOSE 163* 86  BUN 69* 88*  CREATININE 2.84* 2.86*  CALCIUM 10.8* 10.4   CBG (last 3)  No results found for this basename: GLUCAP,  in the last 72 hours  Wt Readings from Last 3 Encounters:  12/22/13 62.5 kg (137 lb 12.6 oz)  12/22/13 62.5 kg (137 lb 12.6 oz)  12/21/13 62.869 kg (138 lb 9.6 oz)    Physical Exam:  Constitutional: He is oriented to person, place, and time. He appears well-developed and well-nourished.  HENT: oral mucosa pink/moist  Head: Normocephalic and atraumatic.  Eyes: Conjunctivae are normal. Pupils are equal, round, and reactive to light.  Neck: Normal range of motion. Neck supple.  Cardiovascular: Normal rate and regular rhythm.  Respiratory: Effort normal and breath sounds normal. No respiratory distress. He has no wheezes.  GI: He exhibits distension. Bowel sounds are increased. There is no tenderness.  Neurological: He is alert and oriented to person, place, and time.  Speech dysarthric. Follows commands without difficulty. UES: RUE: deltoid 5/5, bicep 5/5, tricep 5/5, wrist ext 5/5, hand intrinsics 5/5 LUE: deltoid 5/5, bicep 5/5, triceps 5/5, wrist ext 5/5, hand instrincs 5/5 RLE: Hip 2/5 pain, Isolated KE 2/5. HAD 3+/5. Ankle 4+/5 LLE: Hip 3+, KE 4/5, ankle 4/5.  Sensory changes over the anterior-lateral thigh to right knee. DTR's 1+  Skin: Skin is warm and dry.surgical site intact  M/S: back tender to touch and with  proximal LE movement  Psychiatric: He has a normal mood and affect. His behavior is normal. Judgment and thought content normal Urology: blood/clots from penis  Assessment/Plan: 1. Functional deficits secondary to L3 fx and associated radiculopathy which require 3+ hours per day of interdisciplinary therapy in a comprehensive inpatient rehab setting. Physiatrist is providing close team supervision and 24 hour management of active medical problems listed below. Physiatrist and rehab team continue to assess barriers to discharge/monitor patient progress toward functional and medical goals. FIM:                   Comprehension Comprehension Mode: Auditory Comprehension: 4-Understands basic 75 - 89% of the time/requires cueing 10 - 24% of the time  Expression Expression Mode: Verbal Expression: 3-Expresses basic 50 - 74% of the time/requires cueing 25 - 50% of the time. Needs to repeat parts of sentences.  Social Interaction Social Interaction: 5-Interacts appropriately 90% of the time - Needs monitoring or encouragement for participation or interaction.  Problem Solving Problem Solving: 3-Solves basic 50 - 74% of the time/requires cueing 25 - 49% of the time  Memory Memory: 3-Recognizes or recalls 50 - 74% of the time/requires cueing 25 - 49% of the time  Medical Problem List and Plan:  1. Functional deficits secondary to Rigth L3 radiculopathy , L3 compression fx s/p decompression/kyphoplasty  2. DVT Prophylaxis/Anticoagulation: Pharmaceutical: Lovenox  3. Pain Management: Decrease Gabapentin to renal dose. Limit narcotics d/t lethargy (as well as confusion per partner)  4.  Anxiety disorder/Mood: Delirium appears to be resolving--sleepy this am and reports of hallucinations last night. Team to provide ego support. LCSW to follow for evaluation and support.  5. Neuropsych: This patient is capable of making decisions on her own behalf.  6. Skin/Wound Care: Pressure relief  measures. Add nutritional supplement. May need IVF for hydration.  7. Acute on chronic renal failure: Chronic Hyponatremia with Na-128, BUN/Cr- 39/2.42 at admission. Need to push po fluids. Discontinue fluid restrictions. Continue renal diet. Strict I and O with I/O caths to keep bladder decompressed. H/o BPH on recent renal u/s.   -keep watch of K+ as well 8. Constipation: Will need aggressive bowel program. Change colace to Miralax. SSE with results.  9. H/o BPH: Increased Flomax to 0.8 mg.   -use coude cath for I/C's (passed easily this am), EOB to attempt voids  -Sees [facility] as outpt  -ua neg, culture pending 10. Renal amyloidosis: Follow up with nephrology past discharge.   12. Odynophagia: Tongue edematous with evidence of thrush. Continue diflucan for now  -biotene for oral hygiene and moisture  LOS (Days) 1 A FACE TO FACE EVALUATION WAS PERFORMED  Mose Colaizzi T 12/28/2013 8:00 AM

## 2013-12-28 NOTE — Plan of Care (Signed)
Samuele Storey Barnett RD, LDN Inpatient Clinical Dietitian Pager: [phone] After Hours Pager: [phone]  

## 2013-12-28 NOTE — H&P (View-Only) (Signed)
Physical Medicine and Rehabilitation Admission H&P    CC: L3 compression fracture with radiculopathy  HPI:  Lance [name] is a 73 y.o. male with history of CKD, anxiety disorder, recent 20 lbs weight loss with 3 month h/o progressive weakness and malaise, Lifting injury 2 months ago with subsequent L3 compression Fx with stenosis and L3 radiculopathy. He has failed conservative treatment and elected to undergo L2/3 decompressive laminectomy with foraminotomy and open kyphoplasty vertebroplasty by Dr. Annette Stable on 12/22/13. Bone biopsy negative for tumor and patient with recent dx of renal amyloidosis and will need follow up with Heme/Onc as well as nephrology (MD in [place]). Post op has had issues with urinary retention, acute on chronic renal failure with BUN/Cr rising to 69/2.84. He developed confusion with hallucinations and tremors this weekend with initiation of steroids. Decadron d/c 08/24 and mentation clearing.  Foley discontinued last pm but patient unable to void with in/out cath revealing 900 cc.  Therapy ongoing and CIR recommended by MD and rehab team.    Review of Systems  Constitutional: Positive for malaise/fatigue.  HENT: Negative for hearing loss.   Eyes: Negative for blurred vision.  Respiratory: Negative for shortness of breath.   Cardiovascular: Negative for chest pain and leg swelling.  Gastrointestinal: Positive for nausea and constipation. Negative for abdominal pain.       Sore mouth with difficulty swallowing.   Genitourinary: Positive for frequency.       H/o hesitancy. Does not empty bladder fully per GU.   Musculoskeletal: Positive for back pain and myalgias.  Neurological: Positive for tingling, sensory change (RLE) and weakness. Negative for dizziness and headaches.  Psychiatric/Behavioral: The patient is nervous/anxious.      Past Medical History  Diagnosis Date  . GERD (gastroesophageal reflux disease)   . Arthritis     osteoarthritis  . Hypothyroidism     . Chronic kidney disease     bph,kidney bx,renal insuff.  . Anxiety   . Weight decrease     20lbs    Past Surgical History  Procedure Laterality Date  . Rotator cuff repair  2004    right rotator cuff repair  . 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 Hand surgery    . Renal biopsy    . Laminectomy      L2 L3  WITH OPEN KYPHOPLASTY  . Lumbar laminectomy/decompression microdiscectomy Right 12/22/2013    Procedure: Right L/2-3LUMBAR DECOMPRESSION 1 LEVEL ;  Surgeon: Charlie Pitter, MD;  Location: [facility];  Service: Neurosurgery;  Laterality: Right;  Right L/2-3LUMBAR DECOMPRESSION 1 LEVEL    History reviewed. No pertinent family history.  Social History: Lives with supportive partner. Retired from [employer] (worked in [employer]) he reports that he has never smoked. He has never used smokeless tobacco. He reports that he drinks alcohol. He reports that he does not use illicit drugs.    Allergies: No Known Allergies   Medications Prior to Admission  Medication Sig Dispense Refill  . docusate sodium (COLACE) 100 MG capsule Take 100 mg by mouth daily.      . famotidine (PEPCID AC) 10 MG chewable tablet Chew 10 mg by mouth daily.      Marland Kitchen gabapentin (NEURONTIN) 300 MG capsule Take 300 mg by mouth 3 (three) times daily.      Marland Kitchen  HYDROcodone-acetaminophen (NORCO/VICODIN) 5-325 MG per tablet Take 1 tablet by mouth every 6 (six) hours as needed for moderate pain (every 8 hrs).      Marland Kitchen levothyroxine (SYNTHROID) 50 MCG tablet Take 50 mcg by mouth daily.      . Menthol, Topical Analgesic, (BIOFREEZE EX) Apply 1 application topically 3 (three) times daily as needed (for pain).      . pravastatin (PRAVACHOL) 20 MG tablet Take 20 mg by mouth at bedtime.       . tamsulosin  (FLOMAX) 0.4 MG CAPS capsule Take 0.4 mg by mouth daily.        Home: Home Living Family/patient expects to be discharged to:: Private residence Living Arrangements: Spouse/significant other Available Help at Discharge: Family;Available 24 hours/day Type of Home: House Home Access: Level entry Home Layout: One level Home Equipment: Cane - single point;Walker - 4 wheels;Hand held shower head;Shower seat - built in  Lives With: Spouse;Significant other;Other (Comment) [name])   Functional History: Prior Function Level of Independence: Independent with assistive device(s);Needs assistance Gait / Transfers Assistance Needed: using 4 [employer] ADL's / Homemaking Assistance Needed: (A) with setup of adls  Functional Status:  Mobility: Bed Mobility Overal bed mobility: Needs Assistance Bed Mobility: Supine to Sit;Sidelying to Sit;Rolling Rolling: Max assist (cues to sequence) Sidelying to sit: Mod assist Supine to sit: Mod assist Sit to supine: Min assist General bed mobility comments: mod cues for proper sequence for side to sit edge of bed. pt wanting to keep right hand under right leg due to pain,max cues to use that hand with bed mobiltiy. Transfers Overall transfer level: Needs assistance Equipment used: Rolling walker (2 wheeled) Transfers: Sit to/from Stand Sit to Stand: Min assist General transfer comment: cues for hand placement and ant weight shift with standing. no posterior LOB with stand from bed after cues were provided. Ambulation/Gait Ambulation/Gait assistance: Min assist Ambulation Distance (Feet): 20 Feet Assistive device: Rolling walker (2 wheeled) Gait Pattern/deviations: Trunk flexed;Narrow base of support;Step-to pattern;Antalgic;Decreased stance time - right;Decreased weight shift to right Gait velocity: decreased Gait velocity interpretation: Below normal speed for age/gender General Gait Details: bilateral knees flexed despite vc's. cues on posture, walker  position and for heel strike with intital contact with gait.    ADL: ADL Overall ADL's : Needs assistance/impaired Eating/Feeding: Modified independent;Sitting Grooming: Maximal assistance;Oral care;Cueing for safety;Cueing for sequencing;Standing Grooming Details (indicate cue type and reason): Pt with knee flexion and forward flexion at hips. pt needed constant cues at shoulder for upright posture and therapist helping support (A) sink level. pt requires max (A) to static stand for oral care. Pt unable to complete oral care completely due to fatigue and decr static standing. pt returned to sitting in chair then provided mouth wash Upper Body Bathing: Minimal assitance;Sitting Lower Body Bathing: Moderate assistance;Sit to/from stand Upper Body Dressing : Total assistance;Sitting Upper Body Dressing Details (indicate cue type and reason): pt total (A) to don back brace. pt with posterior lean and posterior pelvic tilt. Pt unable to sustain static sitting EOB.  Lower Body Dressing: Maximal assistance;Sit to/from stand Lower Body Dressing Details (indicate cue type and reason): pt don mesh underwear with pad to prevent incontinence with sit<>Stand Toilet Transfer: Moderate assistance;BSC;RW Functional mobility during ADLs: Moderate assistance;Rolling walker General ADL Comments: Pt remains with balance deficits, forward flexion at hips, severe pain in Rt LE and slow progress toward goals. Called CIR coordinator regarding recommendation. Pt s spouse very concered with confusion level and need for further workup  in [place] for blood work. Spouse provided paper and pen to document all questions and information to help advocate/ organzie information being provided.  Cognition: Cognition Overall Cognitive Status: Impaired/Different from baseline Orientation Level: Oriented X4;Other (comment) (Forgetful) Cognition Arousal/Alertness: Awake/alert Behavior During Therapy: Flat affect Overall  Cognitive Status: Impaired/Different from baseline Area of Impairment: Safety/judgement;Problem solving Orientation Level: Disoriented to;Time Memory: Decreased recall of precautions;Decreased short-term memory Safety/Judgement: Decreased awareness of deficits Problem Solving: Slow processing General Comments: Pt rolling blanket in hands and very restless. pt asked "do you need help?" pt stops looks at therapist and states "I dont know what I am doing. Was I folding it?"  Physical Exam: Blood pressure 98/61, pulse 107, temperature 98.7 F (37.1 C), temperature source Oral, resp. rate 18, height $RemoveBe'5\' 8"'[id number]$  (1.727 m), weight 62.5 kg (137 lb 12.6 oz), SpO2 97.00%. Physical Exam  Nursing note and vitals reviewed.  Constitutional: He is oriented to person, place, and time. He appears well-developed and well-nourished.  HENT: oral mucosa pink/moist Head: Normocephalic and atraumatic.  Eyes: Conjunctivae are normal. Pupils are equal, round, and reactive to light.  Neck: Normal range of motion. Neck supple.  Cardiovascular: Normal rate and regular rhythm.  Respiratory: Effort normal and breath sounds normal. No respiratory distress. He has no wheezes.  GI: He exhibits distension. Bowel sounds are increased. There is no tenderness.  Neurological: He is alert and oriented to person, place, and time.  Speech dysarthric. Follows commands without difficulty. UES: RUE: deltoid 5/5, bicep 5/5, tricep 5/5, wrist ext 5/5, hand intrinsics 5/5 LUE: deltoid 5/5, bicep 5/5, triceps 5/5, wrist ext 5/5, hand instrincs 5/5 RLE: Hip 2/5 pain, Isolated KE 2/5. HAD 3+/5. Ankle 4+/5 LLE: Hip 3+, KE 4/5, ankle 4/5.  Sensory changes over the anterior-lateral thigh to right knee. DTR's 1+  Skin: Skin is warm and dry.surgical site intact M/S: back tender to touch and with proximal LE movement  Psychiatric: He has a normal mood and affect. His behavior is normal. Judgment and thought content normal   Results for orders  placed during the hospital encounter of 12/22/13 (from the past 48 hour(s))  BASIC METABOLIC PANEL     Status: Abnormal   Collection Time    12/25/13  1:47 PM      Result Value Ref Range   Sodium 129 (*) 137 - 147 mEq/L   Potassium 5.2  3.7 - 5.3 mEq/L   Chloride 93 (*) 96 - 112 mEq/L   CO2 19  19 - 32 mEq/L   Glucose, Bld 163 (*) 70 - 99 mg/dL   BUN 69 (*) 6 - 23 mg/dL   Creatinine, Ser 2.84 (*) 0.50 - 1.35 mg/dL   Calcium 10.8 (*) 8.4 - 10.5 mg/dL   GFR calc non Af Amer 21 (*) >90 mL/min   GFR calc Af Amer 24 (*) >90 mL/min   Comment: (NOTE)     The eGFR has been calculated using the CKD EPI equation.     This calculation has not been validated in all clinical situations.     eGFR's persistently <90 mL/min signify possible Chronic Kidney     Disease.   Anion gap 17 (*) 5 - 15   No results found.     Medical Problem List and Plan: 1. Functional deficits secondary to  Rigth L3 radiculopathy , L3 compression fx s/p decompression/kyphoplasty 2.  DVT Prophylaxis/Anticoagulation: Pharmaceutical: Lovenox 3. Pain Management:  Decrease Gabapentin to renal dose. Limit narcotics as continues to have bouts of  lethargy (as well as confusion per partner) 4. Anxiety disorder/Mood:  Delirium appears to be resolving--sleepy this am and reports of hallucinations last night.  Team to provide ego support. LCSW to follow for evaluation and support.  5. Neuropsych: This patient is capable of making decisions on her  own behalf. 6. Skin/Wound Care: Pressure relief measures. Add nutritional supplement. May need IVF for hydration.  7. Acute on chronic renal failure: Chronic Hyponatremia with Na-128, BUN/Cr- 39/2.42 at admission. Need to push po fluids. Discontinue fluid restrictions. Continue renal diet.  Strict I and O with I/O caths to keep bladder decompressed. H/o BPH on recent renal u/s.  8. Constipation: Will need aggressive bowel program. Change colace to Miralax. SSE today.  9. H/o BPH: Increase  Flomax to 0.8 mg. Has seen Dr Risa Grill few months ago.  10. Renal amyloidosis: Follow up with nephrology past discharge.  11. Urinary retention: Multifactorial due to recent surgery, immobility, narcotic use as well as BPH. Will check UA/UCS to rule out UTI.  12.  Odynophagia: Tongue edematous with evidence of thrush. Will add diflucan for treatment.   Post Admission Physician Evaluation: 1. Functional deficits secondary  to L3 compression fx with right L3 radiculopathy. 2. Patient is admitted to receive collaborative, interdisciplinary care between the physiatrist, rehab nursing staff, and therapy team. 3. Patient's level of medical complexity and substantial therapy needs in context of that medical necessity cannot be provided at a lesser intensity of care such as a SNF. 4. Patient has experienced substantial functional loss from his/her baseline which was documented above under the "Functional History" and "Functional Status" headings.  Judging by the patient's diagnosis, physical exam, and functional history, the patient has potential for functional progress which will result in measurable gains while on inpatient rehab.  These gains will be of substantial and practical use upon discharge  in facilitating mobility and self-care at the household level. 5. Physiatrist will provide 24 hour management of medical needs as well as oversight of the therapy plan/treatment and provide guidance as appropriate regarding the interaction of the two. 6. 24 hour rehab nursing will assist with bladder management, bowel management, safety, skin/wound care, disease management, medication administration, pain management and patient education  and help integrate therapy concepts, techniques,education, etc. 7. PT will assess and treat for/with: Lower extremity strength, range of motion, stamina, balance, functional mobility, safety, adaptive techniques and equipment, NMR, pain mgt, back precautions, core strengthening.    Goals are: mod I to supervision. 8. OT will assess and treat for/with: ADL's, functional mobility, safety, upper extremity strength, adaptive techniques and equipment, NMR, pain mgt, back precautions, caregiver education.   Goals are: mod I to set up. 9. SLP will assess and treat for/with: n/a.  Goals are: n/a. 10. Case Management and Social Worker will assess and treat for psychological issues and discharge planning. 11. Team conference will be held weekly to assess progress toward goals and to determine barriers to discharge. 12. Patient will receive at least 3 hours of therapy per day at least 5 days per week. 13. ELOS: 12-15 days       14. Prognosis:  excellent     Meredith Staggers, MD, [facility]   12/27/2013

## 2013-12-28 NOTE — Progress Notes (Signed)
Order received to insert coude catheter due to urinary retention. 16 french coude catheter inserted at 1000 . Met resistance but able to advance catheter with bloody drainage and urine cleared after approximately 45 seconds in catheter bag. Patient tolerated well. Continue with plan of care .                    Lance [name]

## 2013-12-28 NOTE — Plan of Care (Signed)
Problem: Food- and Nutrition-Related Knowledge Deficit (NB-1.1) Goal: Nutrition education Formal process to instruct or train a patient/client in a skill or to impart knowledge to help patients/clients voluntarily manage or modify food choices and eating behavior to maintain or improve health. Outcome: Completed/Met Date Met:  12/28/13 Nutrition Education Note  RD consulted for Renal Education. Provided Food Pyramid for Healthy Eating with Kidney Disease handout for pt. Reviewed food groups and provided written recommended serving sizes specifically determined for patient's current nutritional status.   Explained why diet restrictions are needed and provided lists of foods to limit/avoid that are high potassium, sodium, and phosphorus. Provided specific recommendations on safer alternatives of these foods. Strongly encouraged compliance of this diet. Discussed importance of protein intake at each meal and snack and renal-friendly beverage options and supplements.Teach back method used.  Expect good compliance.  Current diet order is regular with low potassium and phosphorous, patient is consuming approximately 10-70% of meals at this time. Labs and medications reviewed.   Kallie Locks, MS, Provisional LDN Pager # [phone] After hours/ weekend pager # [phone]

## 2013-12-28 NOTE — Evaluation (Signed)
Physical Therapy Assessment and Plan  Patient Details  Name: Lance [name] MRN: [id number] Date of Birth: [birth year]  PT Diagnosis: Difficulty walking, Impaired sensation, Low back pain, Muscle weakness and Pain in right LE Rehab Potential: Good ELOS: 12 - 14 days   Today's Date: 12/28/2013 PT Individual Time: 0800-0900 PT Individual Time Calculation (min): 60 min    Problem List:  Patient Active Problem List   Diagnosis Date Noted  . Myelopathy of lumbar region 12/27/2013  . Malnutrition of moderate degree 12/25/2013  . Closed lumbar vertebral fracture 12/22/2013  . Spinal stenosis of lumbar region 12/22/2013  . Lumbar vertebral fracture 12/22/2013  . Degenerative arthritis of hip 05/08/2011    Past Medical History:  Past Medical History  Diagnosis Date  . GERD (gastroesophageal reflux disease)   . Arthritis     osteoarthritis  . Hypothyroidism   . Chronic kidney disease     bph,kidney bx,renal insuff.  . Anxiety   . Weight decrease     20lbs   Past Surgical History:  Past Surgical History  Procedure Laterality Date  . Rotator cuff repair  2004    right rotator cuff repair  . 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 Hand surgery    . Renal biopsy    . Laminectomy      L2 L3  WITH OPEN KYPHOPLASTY  . Lumbar laminectomy/decompression microdiscectomy Right 12/22/2013    Procedure: Right L/2-3LUMBAR DECOMPRESSION 1 LEVEL ;  Surgeon: Charlie Pitter, MD;  Location: [place] [facility];  Service: Neurosurgery;  Laterality: Right;  Right L/2-3LUMBAR DECOMPRESSION 1 LEVEL    Assessment & Plan Clinical Impression: Lance [name] is a 73 y.o. male with history of CKD, anxiety disorder, recent 20 lbs weight loss with 3 month h/o progressive weakness and malaise, Lifting  injury 2 months ago with subsequent L3 compression Fx with stenosis and L3 radiculopathy. He has failed conservative treatment and elected to undergo L2/3 decompressive laminectomy with foraminotomy and open kyphoplasty vertebroplasty by Dr. Annette Stable on 12/22/13. Bone biopsy negative for tumor and patient with recent dx of renal amyloidosis and will need follow up with Heme/Onc as well as nephrology (MD in [place]). Post op has had issues with urinary retention, acute on chronic renal failure with BUN/Cr rising to 69/2.84. He developed confusion with hallucinations and tremors this weekend with initiation of steroids. Decadron d/c 08/24 and mentation clearing. Foley discontinued. Patient transferred to CIR on 12/27/2013 .   Patient currently requires mod with mobility secondary to muscle weakness and decreased standing balance, decreased postural control and pain.  Prior to hospitalization, patient was modified independent  with mobility and lived with Significant other in a House home.  Home access is  Level entry.  Patient will benefit from skilled PT intervention to maximize safe functional mobility, minimize fall risk and decrease caregiver burden for planned discharge home with 24 hour supervision.  Anticipate patient will benefit from follow up [place] at discharge.  PT - End of Session Activity Tolerance: Tolerates 30+ min activity with multiple rests Endurance Deficit: Yes PT Assessment Rehab Potential: Good PT Patient demonstrates impairments in the following area(s): Balance;Endurance;Pain PT Transfers Functional Problem(s): Bed Mobility;Bed to Chair;Car PT Locomotion Functional Problem(s): Ambulation;Wheelchair Mobility;Stairs PT Plan PT Intensity: Minimum of 1-2 x/day ,45 to 90 minutes PT  Frequency: 5 out of 7 days PT Duration Estimated Length of Stay: 12 - 14 days PT Treatment/Interventions: Ambulation/gait training;Balance/vestibular training;Discharge planning;Functional mobility training;Pain  management;Patient/family education;Stair training;Therapeutic Activities;Therapeutic Exercise;UE/LE Strength taining/ROM;Wheelchair propulsion/positioning PT Transfers Anticipated Outcome(s): mod I basic transfers; supervision for car transfers PT Locomotion Anticipated Outcome(s): supervision household ambulation and min assist for stairs PT Recommendation Follow Up Recommendations: Home health PT Patient destination: Home Equipment Recommended: To be determined Equipment Details: had borrowed rollator; will need one of his own;  Skilled Therapeutic Intervention Patient in bed finishing breakfast upon entering room. Patient able to recall 1 of 3 back precautions (no twisting). Patient participated in initial evaluation as able limited by pain in right LE. Patient supine to sit with verbal cueing to maintain precautions. Patient required assistance to don back corset in sitting. Patient sit to stand and ambulated with rolling walker 15 feet and min assist before requesting to stop due to pain. Patient propelled wheelchair 12 feet with bilateral UE's again reporting pain and requesting to get back in bed. Patient performed stand pivot transfer with rolling walker and mod assist for sit to stand and min/steady assist to pivot. Patient mod assist for sit to supine and position in bed. Patient left in bed with all items in reach and significant other at bedside.   PT Evaluation Precautions/Restrictions Precautions Precautions: Back;Fall Precaution Comments: pt only able to report 1/3 precautions, reviewed verbally Required Braces or Orthoses: Spinal Brace Spinal Brace: Lumbar corset;Applied in sitting position Restrictions Weight Bearing Restrictions: No General Chart Reviewed: Yes Family/Caregiver Present: Yes  Pain Pain Assessment Pain Assessment: 0-10 Pain Score: 9  Pain Type: Acute pain Pain Location: Leg Pain Orientation: Right Pain Descriptors / Indicators: Aching Home Living/Prior  Functioning Home Living Living Arrangements: Spouse/significant other Available Help at Discharge: Family;Available 24 hours/day Type of Home: House Home Access: Level entry Home Layout: One level  Lives With: Significant other Prior Function Level of Independence: Independent with basic ADLs;Independent with gait (Patient reports using rollator prior to admission)  Able to Take Stairs?: Yes Vocation: Retired Leisure: Hobbies-yes (Comment) Comments: aerobics up to a few months ago  Cognition Overall Cognitive Status: Within Functional Limits for tasks assessed Arousal/Alertness: Awake/alert Orientation Level: Oriented X4 Attention: Sustained Sustained Attention: Appears intact Memory: Appears intact Awareness: Appears intact Problem Solving: Impaired Problem Solving Impairment: Functional basic Safety/Judgment: Appears intact Sensation Sensation Light Touch: Impaired Detail Light Touch Impaired Details: Impaired RLE;Impaired LLE (decreased to light touch bilateral LE below ankles) Stereognosis: Not tested Hot/Cold: Not tested Proprioception: Not tested Coordination Gross Motor Movements are Fluid and Coordinated: No Fine Motor Movements are Fluid and Coordinated: No Motor  Motor Motor - Skilled Clinical Observations: generalized weakness throughout  Mobility Bed Mobility Bed Mobility: Rolling Right;Rolling Left;Supine to Sit;Sit to Supine Rolling Right: 4: Min assist;With rail Rolling Left: 4: Min assist;With rail Supine to Sit: 3: Mod assist Sit to Supine: 3: Mod assist Transfers Transfers: Yes (patient performed sit to stand and w/c to bed stand pivot transfer with mod assist) Locomotion  Ambulation Ambulation/Gait Assistance: 4: Min assist Ambulation Distance (Feet): 15 Feet Gait Gait: Yes Gait Pattern: Step-to pattern;Decreased stance time - right;Antalgic;Trunk flexed Gait velocity: decreased Wheelchair Mobility Distance: 12 feet   Balance Dynamic  Sitting Balance Sitting balance - Comments: posterior lean and pelvic tilt. Patient can maintain static sitting edbe of bed with supervision. Extremity Assessment  RUE Assessment RUE Assessment: Within Functional Limits LUE Assessment LUE Assessment: Within Functional Limits RLE Assessment RLE Assessment: Exceptions to [facility]  RLE Strength RLE Overall Strength: Due to pain RLE Overall Strength Comments: grossly 3-/5 due to pain in leg with movement LLE Assessment LLE Assessment: Within Functional Limits  FIM:  FIM - Bed/Chair Transfer Bed/Chair Transfer Assistive Devices: Bed rails;Arm rests;Walker Bed/Chair Transfer: 3: Supine > Sit: Mod A (lifting assist/Pt. 50-74%/lift 2 legs;3: Sit > Supine: Mod A (lifting assist/Pt. 50-74%/lift 2 legs);3: Bed > Chair or W/C: Mod A (lift or lower assist);3: Chair or W/C > Bed: Mod A (lift or lower assist) FIM - Locomotion: Wheelchair Distance: 12 feet Locomotion: Wheelchair: 1: Travels less than 50 ft with minimal assistance (Pt.>75%) FIM - Locomotion: Ambulation Locomotion: Ambulation Assistive Devices: Administrator Ambulation/Gait Assistance: 4: Min assist Locomotion: Ambulation: 1: Travels less than 50 ft with minimal assistance (Pt.>75%) FIM - Locomotion: Stairs Locomotion: Stairs: 0: Activity did not occur   Refer to Care Plan for Long Term Goals  Recommendations for other services: None  Discharge Criteria: Patient will be discharged from PT if patient refuses treatment 3 consecutive times without medical reason, if treatment goals not met, if there is a change in medical status, if patient makes no progress towards goals or if patient is discharged from hospital.  The above assessment, treatment plan, treatment alternatives and goals were discussed and mutually agreed upon: by patient  Sanjuana Letters 12/28/2013, 1:51 PM

## 2013-12-28 NOTE — Progress Notes (Signed)
Nursing Note: pt in and out cathed for 250 cc of urine . Initially cherry colored then clear ,yellow. Pt tolerated well.wbb

## 2013-12-28 NOTE — Progress Notes (Signed)
Patient information reviewed and entered into eRehab system by Kalany Diekmann, RN, CRRN, PPS Coordinator.  Information including medical coding and functional independence measure will be reviewed and updated through discharge.     Per nursing patient was given "Data Collection Information Summary for Patients in Inpatient Rehabilitation Facilities with attached "Privacy Act Statement-Health Care Records" upon admission.  

## 2013-12-28 NOTE — Progress Notes (Signed)
INITIAL NUTRITION ASSESSMENT  Pt meets criteria for NON-SEVERE (MODERATE) MALNUTRITION in the context of chronic illness as evidenced by a weight loss of 15% in 8 month, energy intake < 75% for >/= 1 month, and moderate fat and muscle mass depletion.  DOCUMENTATION CODES Per approved criteria  -Non-severe (moderate) malnutrition in the context of chronic illness   INTERVENTION: Provide Nepro Shake po BID, each supplement provides 425 kcal and 19 grams protein.  Encourage PO intake.  NUTRITION DIAGNOSIS: Inadequate oral intake related to decreased appetite and mouth pains as evidenced by meal completion of 10-70%.   Goal: Pt to meet >/= 90% of their estimated nutrition needs   Monitor:  PO intake, weight trends, labs, I/O's  Reason for Assessment: MST  73 y.o. male  Admitting Dx: Myelopathy of lumbar region  ASSESSMENT: Pt with PMH of CKD, anxiety disorder, recent 20 lbs weight loss with 3 month h/o progressive weakness and malaise, Lifting injury 2 months ago with subsequent L3 compression Fx with stenosis and L3 radiculopathy. He has failed conservative treatment and elected to undergo L2/3 decompressive laminectomy with foraminotomy and open kyphoplasty vertebroplasty on 12/22/13.  Meal completion is varied from 10-70%. Pt reports he has been having mouth pains/sores, which have been causing him to eat less. Pt also reports having a decreased appetite, which has been going on since the beginning of the year. Dietary recall is eggs at breakfast, salad with protein at lunch, and pork chops or another animal protein at dinner.Pt reports he has been noticing that he has been eating less with smaller portions at meals.  Pt reports he has lost weight over the past 8 months with his usual body weight of 162 lbs. Noted pt with a 15% weight loss in 8 months. Pt reports he has been drinking Nepro at home to help with calorie and protein needs and would like to drink some while hospitalized.  Will order.   Dietitian was additionally consulted for a renal diet education. Pt was educated on limiting/avoiding high containing potassium, sodium, and phosphorous foods.   Nutrition Focused Physical Exam:  Subcutaneous Fat:  Orbital Region: N/A Upper Arm Region: Moderate depletion Thoracic and Lumbar Region: Moderate depletion  Muscle:  Temple Region: WNL Clavicle Bone Region: Moderate depletion Clavicle and Acromion Bone Region: Moderate depletion Scapular Bone Region: N/A Dorsal Hand: Moderate depletion Patellar Region: Moderate depletion Anterior Thigh Region: Severe depletion (due to injury and decreased muscle use) Posterior Calf Region: Moderate depletion  Edema: none  Labs: Low sodium and GFR. High potassium, BUN, creatinine, alkaline phosphatase  Height: Ht Readings from Last 1 Encounters:  12/22/13  (1.727 m)    Weight: Wt Readings from Last 1 Encounters:  12/22/13 137 lb 12.6 oz (62.5 kg)    Ideal Body Weight: 154 lbs  % Ideal Body Weight: 89%  Wt Readings from Last 10 Encounters:  12/22/13 137 lb 12.6 oz (62.5 kg)  12/22/13 137 lb 12.6 oz (62.5 kg)  12/21/13 138 lb 9.6 oz (62.869 kg)  05/08/11 163 lb (73.936 kg)  05/08/11 163 lb (73.936 kg)  05/01/11 163 lb (73.936 kg)    Usual Body Weight: 162 lbs  % Usual Body Weight: 85%  BMI:  20.8  Estimated Nutritional Needs: Kcal: 1850-2050 Protein: 65-80 gram  Fluid: 1.85 - 2.05 L/day  Skin: back incision, right hip incision  Diet Order: General  EDUCATION NEEDS: -Education needs addressed   Intake/Output Summary (Last 24 hours) at 12/28/13 0854 Last data filed at 12/27/13 2250  Gross per 24 hour  Intake    240 ml  Output    700 ml  Net   -460 ml    Last BM: 8/26  Labs:   Recent Labs Lab 12/22/13 0630 12/25/13 1347 12/28/13 0630  NA 128* 129* 136*  K 5.4* 5.2 5.4*  CL 90* 93* 99  CO2 BUN 45* 69* 88*  CREATININE 2.53* 2.84* 2.86*  CALCIUM 12.6* 10.8* 10.4   GLUCOSE 87 163* 86    CBG (last 3)  No results found for this basename: GLUCAP,  in the last 72 hours  Scheduled Meds: . antiseptic oral rinse  15 mL Mouth Rinse BID  . enoxaparin (LOVENOX) injection  30 mg Subcutaneous Q24H  . famotidine  10 mg Oral Daily  . fluconazole  100 mg Oral Daily  . gabapentin  200 mg Oral TID  . levothyroxine  50 mcg Oral QAC breakfast  . pantoprazole  40 mg Oral BID  . polyethylene glycol  17 g Oral BID  . simvastatin  10 mg Oral q1800  . tamsulosin  0.4 mg Oral BID    Continuous Infusions:   Past Medical History  Diagnosis Date  . GERD (gastroesophageal reflux disease)   . Arthritis     osteoarthritis  . Hypothyroidism   . Chronic kidney disease     bph,kidney bx,renal insuff.  . Anxiety   . Weight decrease     20lbs    Past Surgical History  Procedure Laterality Date  . Rotator cuff repair  2004    right rotator cuff repair  . Total hip arthroplasty  05/08/2011    Procedure: TOTAL HIP ARTHROPLASTY ANTERIOR APPROACH;  Surgeon: Kathryne Hitch;  Location: [facility];  Service: Orthopedics;  Laterality: Right;  Right Total Hip Arthroplasty, Direct Anterior Approach (C-Arm)  . Hand surgery    . Renal biopsy    . Laminectomy      L2 L3  WITH OPEN KYPHOPLASTY  . Lumbar laminectomy/decompression microdiscectomy Right 12/22/2013    Procedure: Right L/2-3LUMBAR DECOMPRESSION 1 LEVEL ;  Surgeon: Temple Pacini, MD;  Location: [facility];  Service: Neurosurgery;  Laterality: Right;  Right L/2-3LUMBAR DECOMPRESSION 1 LEVEL    Marijean Niemann, MS, Provisional LDN Pager # [phone] After hours/ weekend pager # [phone]

## 2013-12-28 NOTE — Progress Notes (Signed)
Nursing Note: pt in and out cathed by Arlene,NT for 600 cc of urine.Pt voided some but unable to empty.wbb

## 2013-12-28 NOTE — Evaluation (Signed)
Occupational Therapy Assessment and Plan  Patient Details  Name: Lance [name] MRN: [id number] Date of Birth: Apr 13, 1941  OT Diagnosis: acute pain, lumbago (low back pain), muscle weakness (generalized) and pain in joint Rehab Potential: Rehab Potential: Good ELOS: 12-14 days   Today's Date: 12/28/2013 OT Individual Time: 1000-1100 OT Individual Time Calculation (min): 60 min     Problem List:  Patient Active Problem List   Diagnosis Date Noted  . Myelopathy of lumbar region 12/27/2013  . Malnutrition of moderate degree 12/25/2013  . Closed lumbar vertebral fracture 12/22/2013  . Spinal stenosis of lumbar region 12/22/2013  . Lumbar vertebral fracture 12/22/2013  . Degenerative arthritis of hip 05/08/2011    Past Medical History:  Past Medical History  Diagnosis Date  . GERD (gastroesophageal reflux disease)   . Arthritis     osteoarthritis  . Hypothyroidism   . Chronic kidney disease     bph,kidney bx,renal insuff.  . Anxiety   . Weight decrease     20lbs   Past Surgical History:  Past Surgical History  Procedure Laterality Date  . Rotator cuff repair  2004    right rotator cuff repair  . 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 Hand surgery    . Renal biopsy    . Laminectomy      L2 L3  WITH OPEN KYPHOPLASTY  . Lumbar laminectomy/decompression microdiscectomy Right 12/22/2013    Procedure: Right L/2-3LUMBAR DECOMPRESSION 1 LEVEL ;  Surgeon: Charlie Pitter, MD;  Location: [facility];  Service: Neurosurgery;  Laterality: Right;  Right L/2-3LUMBAR DECOMPRESSION 1 LEVEL    Assessment & Plan Clinical Impression: Lance [name] is a 73 y.o. male with history of CKD, anxiety disorder, recent 20 lbs weight loss with 3 month h/o progressive weakness and  malaise, Lifting injury 2 months ago with subsequent L3 compression Fx with stenosis and L3 radiculopathy. He has failed conservative treatment and elected to undergo L2/3 decompressive laminectomy with foraminotomy and open kyphoplasty vertebroplasty by Dr. Annette Stable on 12/22/13. Bone biopsy negative for tumor and patient with recent dx of renal amyloidosis and will need follow up with Heme/Onc as well as nephrology (MD in [place]). Post op has had issues with urinary retention, acute on chronic renal failure with BUN/Cr rising to 69/2.84. He developed confusion with hallucinations and tremors this weekend with initiation of steroids. Decadron d/c 08/24 and mentation clearing. Foley discontinued last pm but patient unable to void with in/out cath revealing 900 cc.  Patient transferred to CIR on 12/27/2013 .    Patient currently requires total-min assist with basic self-care skills secondary to muscle weakness, decreased cardiorespiratoy endurance and decreased standing balance, decreased postural control, decreased balance strategies and difficulty maintaining precautions.  Prior to hospitalization, patient could complete BADLs with modified independent .  Patient will benefit from skilled intervention to increase independence with basic self-care skills and increase level of independence with iADL prior to discharge home with care partner.  Anticipate patient will require intermittent supervision and follow up home health.  OT - End of Session Activity Tolerance: Decreased this session Endurance Deficit: Yes OT Assessment Rehab Potential: Good OT Patient demonstrates impairments in the following area(s): Balance;Pain;Safety;Endurance;Motor OT Basic ADL's Functional Problem(s): Grooming;Bathing;Dressing;Toileting OT Advanced ADL's Functional Problem(s): Simple Meal Preparation OT Transfers Functional Problem(s): Toilet;Tub/Shower  OT Additional Impairment(s): Other (comment) (limited by pain) OT Plan OT  Intensity: Minimum of 1-2 x/day, 45 to 90 minutes OT Frequency: 5 out of 7 days OT Duration/Estimated Length of Stay: 12-14 days OT Treatment/Interventions: Balance/vestibular training;Community reintegration;Discharge planning;DME/adaptive equipment instruction;Functional mobility training;Pain management;Psychosocial support;Patient/family education;Self Care/advanced ADL retraining;Therapeutic Activities;Therapeutic Exercise;UE/LE Strength taining/ROM;UE/LE Coordination activities OT Self Feeding Anticipated Outcome(s): n/a OT Basic Self-Care Anticipated Outcome(s): Mod I OT Toileting Anticipated Outcome(s): Mod I OT Bathroom Transfers Anticipated Outcome(s): Mod I OT Recommendation Patient destination: Home Follow Up Recommendations: Home health OT Equipment Recommended: None recommended by OT Equipment Details: pt has shower chair, grab bars, and elevated toilet   Skilled Therapeutic Intervention OT eval completed. Discussed goal of therapy, role of OT, ELOS, safety plan, and precautions. Pt received supine in bed lying on bed pan. Required total assist for hygiene. Pt recalled 3/3 back precautions. Donned brace sitting EOB. Completed SPT bed>w/c with min assist using RW and mod cues of encouragement. Pt's significant other,[name], present during session. Educated on allowing pt to complete as much self-care tasks as he can, however, [name] required multiple cues to allow pt to complete tasks himself. Pt required mod assist sit<>stand from w/c during LB self-care. Pt declining assist to manage clothing around waist as he was fearful of falling and limited by pain. Pt left sitting in w/c with all needs in reach and no questions from pt or [name].   OT Evaluation Precautions/Restrictions  Precautions Precautions: Back Required Braces or Orthoses: Spinal Brace Spinal Brace: Lumbar corset;Applied in sitting position Restrictions Weight Bearing Restrictions: No General   Vital Signs   Pain Pain  Assessment Pain Assessment: 0-10 Pain Score: 7  Pain Type: Acute pain Pain Location: Back Home Living/Prior Functioning Home Living Family/patient expects to be discharged to:: Private residence Living Arrangements: Spouse/significant other Available Help at Discharge: Family;Available 24 hours/day Type of Home: House Home Access: Level entry Home Layout: One level  Lives With: Spouse;Significant other;Other (Comment) Prior Function Level of Independence: Independent with basic ADLs;Independent with homemaking with ambulation;Independent with transfers Vocation: Retired Leisure: Hobbies-yes (Comment) Comments: gardening ADL   Vision/Perception  Vision- History Baseline Vision/History: Wears glasses Wears Glasses: At all times Patient Visual Report: No change from baseline Vision- Assessment Vision Assessment?: No apparent visual deficits  Cognition Overall Cognitive Status: Within Functional Limits for tasks assessed Arousal/Alertness: Awake/alert Orientation Level: Oriented X4 Attention: Sustained Sustained Attention: Appears intact Memory: Appears intact Awareness: Appears intact Problem Solving: Impaired Problem Solving Impairment: Functional basic Safety/Judgment: Appears intact Sensation Sensation Light Touch: Appears Intact Hot/Cold: Appears Intact Coordination Gross Motor Movements are Fluid and Coordinated: No Fine Motor Movements are Fluid and Coordinated: Yes Motor  Motor Motor - Skilled Clinical Observations: generalized weakness throughout Mobility  Bed Mobility Bed Mobility: Rolling Right;Rolling Left;Supine to Sit;Sit to Supine Rolling Right: 4: Min assist;With rail Rolling Left: 4: Min assist;With rail Supine to Sit: 3: Mod assist Sit to Supine: 3: Mod assist Transfers Transfers: Sit to Stand Sit to Stand: 4: Min assist  Trunk/Postural Assessment  Cervical Assessment Cervical Assessment: Within Functional Limits Thoracic  Assessment Thoracic Assessment: Within Functional Limits Lumbar Assessment Lumbar Assessment: Within Functional Limits Postural Control Postural Control: Within Functional Limits  Balance Dynamic Sitting Balance Sitting balance - Comments: posterior lean and pelvic tilt. Patient can maintain static sitting edbe of bed with supervision. Extremity/Trunk Assessment RUE Assessment RUE Assessment: Within Functional Limits (4/5 overall) LUE Assessment LUE Assessment: Within Functional Limits (4/5 overall)  FIM:  FIM - Eating Eating Activity:  6: More than reasonable amount of time;5: Set-up assist for open containers;5: Set-up assist for cut food FIM - Grooming Grooming Steps: Wash, rinse, dry face;Wash, rinse, dry hands;Brush, comb hair Grooming: 5: Set-up assist to obtain items FIM - Bathing Bathing Steps Patient Completed: Chest;Right Arm;Left Arm;Abdomen;Front perineal area Bathing: 3: Mod-Patient completes 5-7 28f10 parts or 50-74% FIM - Upper Body Dressing/Undressing Upper body dressing/undressing steps patient completed: Thread/unthread right sleeve of pullover shirt/dresss;Thread/unthread left sleeve of pullover shirt/dress;Pull shirt over trunk Upper body dressing/undressing: 4: Min-Patient completed 75 plus % of tasks FIM - Lower Body Dressing/Undressing Lower body dressing/undressing: 1: Total-Patient completed less than 25% of tasks FIM - Toileting Toileting: 1: Total-Patient completed zero steps, helper did all 3 FIM - Bed/Chair Transfer Bed/Chair Transfer Assistive Devices: Bed rails;Arm rests;Walker Bed/Chair Transfer: 3: Supine > Sit: Mod A (lifting assist/Pt. 50-74%/lift 2 legs;4: Bed > Chair or W/C: Min A (steadying Pt. > 75%)   Refer to Care Plan for Long Term Goals  Recommendations for other services: None  Discharge Criteria: Patient will be discharged from OT if patient refuses treatment 3 consecutive times without medical reason, if treatment goals not met, if  there is a change in medical status, if patient makes no progress towards goals or if patient is discharged from hospital.  The above assessment, treatment plan, treatment alternatives and goals were discussed and mutually agreed upon: by patient and by family  PDuayne Cal8/27/2015, 12:24 PM

## 2013-12-28 NOTE — Progress Notes (Signed)
Nursing Note: Pt called again due to bloody drainage form penis.Lighty thin bloody drainage and a moderate size clot noted. Pt cleaned and repositioned.wbb

## 2013-12-28 NOTE — Progress Notes (Signed)
Nursing Note. Pt given SSE per orders. Administered per hosital policy and pt tolerated well.Clear return of enema.wbb

## 2013-12-28 NOTE — Plan of Care (Signed)
Problem: SCI BLADDER ELIMINATION Goal: RH STG MANAGE BLADDER WITH ASSISTANCE STG Manage Bladder With mod Assistance  Outcome: Not Progressing Has some voids but retains and requires in and out caths by staff.

## 2013-12-28 NOTE — Progress Notes (Signed)
Note and chart reviewed.  Jaana Brodt Barnett RD, LDN Pager: [phone] After Hours Pager: [phone]  

## 2013-12-28 NOTE — Progress Notes (Signed)
Nursing Note: Pt had incont. Voided and had blood form penis and large blood clot. Pt was cathed earlier at 2315.Pt had a moderate amount of blood from penis and on pad underneath pt.Pt cleaned and assisted to turn in bed.wbb

## 2013-12-29 ENCOUNTER — Inpatient Hospital Stay (HOSPITAL_COMMUNITY): Payer: Medicare Other

## 2013-12-29 ENCOUNTER — Inpatient Hospital Stay (HOSPITAL_COMMUNITY): Payer: Medicare Other | Admitting: Physical Therapy

## 2013-12-29 ENCOUNTER — Encounter (HOSPITAL_COMMUNITY): Payer: Medicare Other | Admitting: Occupational Therapy

## 2013-12-29 DIAGNOSIS — IMO0002 Reserved for concepts with insufficient information to code with codable children: Secondary | ICD-10-CM

## 2013-12-29 DIAGNOSIS — R339 Retention of urine, unspecified: Secondary | ICD-10-CM

## 2013-12-29 DIAGNOSIS — Z5189 Encounter for other specified aftercare: Secondary | ICD-10-CM

## 2013-12-29 DIAGNOSIS — W240XXA Contact with lifting devices, not elsewhere classified, initial encounter: Secondary | ICD-10-CM

## 2013-12-29 DIAGNOSIS — N189 Chronic kidney disease, unspecified: Secondary | ICD-10-CM

## 2013-12-29 DIAGNOSIS — S32009A Unspecified fracture of unspecified lumbar vertebra, initial encounter for closed fracture: Secondary | ICD-10-CM

## 2013-12-29 LAB — SODIUM, URINE, RANDOM: Sodium, Ur: 21 mEq/L

## 2013-12-29 LAB — CREATININE, URINE, RANDOM: CREATININE, URINE: 62.77 mg/dL

## 2013-12-29 LAB — BASIC METABOLIC PANEL
Anion gap: 17 — ABNORMAL HIGH (ref 5–15)
BUN: 98 mg/dL — ABNORMAL HIGH (ref 6–23)
CO2: 19 mEq/L (ref 19–32)
CREATININE: 3.33 mg/dL — AB (ref 0.50–1.35)
Calcium: 9.7 mg/dL (ref 8.4–10.5)
Chloride: 97 mEq/L (ref 96–112)
GFR calc non Af Amer: 17 mL/min — ABNORMAL LOW (ref >90)
GFR, EST AFRICAN AMERICAN: 20 mL/min — AB (ref >90)
Glucose, Bld: 91 mg/dL (ref 70–99)
Potassium: 5.3 mEq/L (ref 3.7–5.3)
Sodium: 133 mEq/L — ABNORMAL LOW (ref 137–147)

## 2013-12-29 LAB — URINE MICROSCOPIC-ADD ON

## 2013-12-29 LAB — URINALYSIS, ROUTINE W REFLEX MICROSCOPIC
BILIRUBIN URINE: NEGATIVE
Glucose, UA: NEGATIVE mg/dL
Ketones, ur: NEGATIVE mg/dL
Nitrite: NEGATIVE
Protein, ur: NEGATIVE mg/dL
SPECIFIC GRAVITY, URINE: 1.016 (ref 1.005–1.030)
UROBILINOGEN UA: 1 mg/dL (ref 0.0–1.0)
pH: 5 (ref 5.0–8.0)

## 2013-12-29 LAB — TSH: TSH: 1.36 u[IU]/mL (ref 0.350–4.500)

## 2013-12-29 MED ORDER — SODIUM CHLORIDE 0.9 % IV SOLN
INTRAVENOUS | Status: DC
  Administered 2013-12-29 – 2014-01-02 (×3): via INTRAVENOUS

## 2013-12-29 MED ORDER — SODIUM POLYSTYRENE SULFONATE 15 GM/60ML PO SUSP
30.0000 g | Freq: Once | ORAL | Status: AC
  Administered 2013-12-29: 30 g via ORAL
  Filled 2013-12-29: qty 120

## 2013-12-29 NOTE — Progress Notes (Signed)
[place] PHYSICAL MEDICINE & REHABILITATION     PROGRESS NOTE    Subjective/Complaints: Having pain in right leg still when up. Pain controlled at rest. Quiet night for the most part. Partner VERY anxious about pt's renal progrnosis  Objective: Vital Signs: Blood pressure 99/56, pulse 107, temperature 99.4 F (37.4 C), temperature source Oral, resp. rate 18, SpO2 96.00%. No results found.  Recent Labs  12/28/13 0630  WBC 13.0*  HGB 10.7*  HCT 32.6*  PLT 483*    Recent Labs  12/28/13 0630 12/29/13 0616  NA 136* 133*  K 5.4* 5.3  CL 99 97  GLUCOSE 86 91  BUN 88* 98*  CREATININE 2.86* 3.33*  CALCIUM 10.4 9.7   CBG (last 3)  No results found for this basename: GLUCAP,  in the last 72 hours  Wt Readings from Last 3 Encounters:  12/22/13 62.5 kg (137 lb 12.6 oz)  12/22/13 62.5 kg (137 lb 12.6 oz)  12/21/13 62.869 kg (138 lb 9.6 oz)    Physical Exam:  Constitutional: He is oriented to person, place, and time. He appears well-developed and well-nourished.  HENT: oral mucosa pink/moist  Head: Normocephalic and atraumatic.  Eyes: Conjunctivae are normal. Pupils are equal, round, and reactive to light.  Neck: Normal range of motion. Neck supple.  Cardiovascular: Normal rate and regular rhythm.  Respiratory: Effort normal and breath sounds normal. No respiratory distress. He has no wheezes.  GI: He exhibits distension. Bowel sounds are increased. There is no tenderness.  Neurological: He is alert and oriented to person, place, and time.  Speech dysarthric. Follows commands without difficulty. UES: RUE: deltoid 5/5, bicep 5/5, tricep 5/5, wrist ext 5/5, hand intrinsics 5/5 LUE: deltoid 5/5, bicep 5/5, triceps 5/5, wrist ext 5/5, hand instrincs 5/5 RLE: Hip 2/5 pain, Isolated KE 2/5. HAD 3+/5. Ankle 4+/5 LLE: Hip 3+, KE 4/5, ankle 4/5.  Sensory changes over the anterior-lateral thigh to right knee. DTR's 1+  Skin: Skin is warm and dry.surgical site intact  M/S: back  tender to touch and with proximal LE movement  Psychiatric: He has a normal mood and affect. His behavior is normal. Judgment and thought content normal Urology: blood/clots from penis  Assessment/Plan: 1. Functional deficits secondary to L3 fx and associated radiculopathy which require 3+ hours per day of interdisciplinary therapy in a comprehensive inpatient rehab setting. Physiatrist is providing close team supervision and 24 hour management of active medical problems listed below. Physiatrist and rehab team continue to assess barriers to discharge/monitor patient progress toward functional and medical goals. FIM: FIM - Bathing Bathing Steps Patient Completed: Chest;Right Arm;Left Arm;Abdomen;Front perineal area Bathing: 3: Mod-Patient completes 5-7 23f 10 parts or 50-74%  FIM - Upper Body Dressing/Undressing Upper body dressing/undressing steps patient completed: Thread/unthread right sleeve of pullover shirt/dresss;Thread/unthread left sleeve of pullover shirt/dress;Pull shirt over trunk Upper body dressing/undressing: 4: Min-Patient completed 75 plus % of tasks FIM - Lower Body Dressing/Undressing Lower body dressing/undressing: 1: Total-Patient completed less than 25% of tasks  FIM - Toileting Toileting: 1: Total-Patient completed zero steps, helper did all 3     FIM - Bed/Chair Transfer Bed/Chair Transfer Assistive Devices: Bed rails;Arm rests;Walker Bed/Chair Transfer: 3: Supine > Sit: Mod A (lifting assist/Pt. 50-74%/lift 2 legs;3: Sit > Supine: Mod A (lifting assist/Pt. 50-74%/lift 2 legs);4: Bed > Chair or W/C: Min A (steadying Pt. > 75%);4: Chair or W/C > Bed: Min A (steadying Pt. > 75%)  FIM - Locomotion: Wheelchair Distance: 150 Locomotion: Wheelchair: 4: Travels 150 ft  or more: maneuvers on rugs and over door sillls with minimal assistance (Pt.>75%) FIM - Locomotion: Ambulation Locomotion: Ambulation Assistive Devices: Walker - Rolling Ambulation/Gait Assistance: 4:  Min assist Locomotion: Ambulation: 1: Travels less than 50 ft with minimal assistance (Pt.>75%)  Comprehension Comprehension Mode: Auditory Comprehension: 5-Follows basic conversation/direction: With no assist  Expression Expression Mode: Verbal Expression: 4-Expresses basic 75 - 89% of the time/requires cueing 10 - 24% of the time. Needs helper to occlude trach/needs to repeat words.  Social Interaction Social Interaction: 4-Interacts appropriately 75 - 89% of the time - Needs redirection for appropriate language or to initiate interaction.  Problem Solving Problem Solving: 3-Solves basic 50 - 74% of the time/requires cueing 25 - 49% of the time  Memory Memory: 4-Recognizes or recalls 75 - 89% of the time/requires cueing 10 - 24% of the time  Medical Problem List and Plan:  1. Functional deficits secondary to Rigth L3 radiculopathy , L3 compression fx s/p decompression/kyphoplasty  2. DVT Prophylaxis/Anticoagulation: Pharmaceutical: Lovenox  3. Pain Management: limited with Gabapentin to renal issues. Limit narcotics d/t lethargy (as well as confusion per partner)  4. Anxiety disorder/Mood: Delirium appears to be resolving--sleepy this am and reports of hallucinations last night. Team to provide ego support. LCSW to follow for evaluation and support.  5. Neuropsych: This patient is capable of making decisions on her own behalf.  6. Skin/Wound Care: Pressure relief measures.   7. Acute on chronic renal failure: Chronic Hyponatremia with Na-128, BUN/Cr- 39/2.42 at admission. Need to push po fluids. Discontinue fluid restrictions. Continue renal diet. Strict I and O with I/O caths to keep bladder decompressed. H/o BPH on recent renal u/s.   -keep watch of K+ as well 8. Constipation: Will need aggressive bowel program. Change colace to Miralax. SSE with results.  9. H/o BPH: Increased Flomax to 0.8 mg.   -use coude cath for I/C's, continue prn until PVR's decrease  -Sees G[facility] as  outpt  -ua neg, culture neg 10. Renal amyloidosis: spouse VERY concerned over prognosis and need for treatment of this while here.  -bx report (from [facility]) apparently sent to Dr. Lindalou Hose office (per spouse)  -asked spouse to forward bx report to [place] so that we can review---can consult nephrology if needed    -will attempt to contact nephrology in [place] as well  12. Odynophagia: Tongue edematous with evidence of thrush. Continue diflucan for now  -biotene for oral hygiene and moisture  LOS (Days) 2 A FACE TO FACE EVALUATION WAS PERFORMED  Lashonna Rieke T 12/29/2013 8:13 AM

## 2013-12-29 NOTE — IPOC Note (Addendum)
Overall Plan of Care [facility]) Patient Details Name: Lance [name] MRN: [id number] DOB: Oct 06, 1940  Admitting Diagnosis: L3 compression fx  Hospital Problems: Active Problems:   Myelopathy of lumbar region     Functional Problem List: Nursing Bladder;Bowel;Endurance;Medication Management;Motor;Pain;Safety;Skin Integrity  PT Balance;Endurance;Pain  OT Balance;Pain;Safety;Endurance;Motor  SLP    TR Activity tolerance, functional mobility, balance, safety, pain, anxiety/stress        Basic ADL's: OT Grooming;Bathing;Dressing;Toileting     Advanced  ADL's: OT Simple Meal Preparation     Transfers: PT Bed Mobility;Bed to Chair;Car  OT Toilet;Tub/Shower     Locomotion: PT Ambulation;Wheelchair Mobility;Stairs     Additional Impairments: OT Other (comment) (limited by pain)  SLP        TR      Anticipated Outcomes Item Anticipated Outcome  Self Feeding n/a  Swallowing      Basic self-care  Mod I  Toileting  Mod I   Bathroom Transfers Mod I  Bowel/Bladder  mod I  Transfers  mod I basic transfers; supervision for car transfers  Locomotion  supervision household ambulation and min assist for stairs  Communication     Cognition     Pain  < 4  Safety/Judgment  supervision   Therapy Plan: PT Intensity: Minimum of 1-2 x/day ,45 to 90 minutes PT Frequency: 5 out of 7 days PT Duration Estimated Length of Stay: 12 - 14 days OT Intensity: Minimum of 1-2 x/day, 45 to 90 minutes OT Frequency: 5 out of 7 days OT Duration/Estimated Length of Stay: 12-14 days TR Duration/ELOS:  10 Days TR Frequency:  Min 1 time per week >20 minutes        Team Interventions: Nursing Interventions Patient/Family Education;Bladder Management;Bowel Management;Disease Management/Prevention;Pain Management;Medication Management;Skin Care/Wound Management  PT interventions Ambulation/gait training;Balance/vestibular training;Discharge planning;Functional mobility training;Pain  management;Patient/family education;Stair training;Therapeutic Activities;Therapeutic Exercise;UE/LE Strength taining/ROM;Wheelchair propulsion/positioning  OT Interventions Balance/vestibular training;Community reintegration;Discharge planning;DME/adaptive equipment instruction;Functional mobility training;Pain management;Psychosocial support;Patient/family education;Self Care/advanced ADL retraining;Therapeutic Activities;Therapeutic Exercise;UE/LE Strength taining/ROM;UE/LE Coordination activities  SLP Interventions    TR Interventions Recreation/leisure participation, Balance/Vestibular training, functional mobility, therapeutic activities, UE/LE strength/coordination, w/c mobility, community reintegration, pt/family education, adaptive equipment instruction/use, discharge planning, psychosocial support  SW/CM Interventions Discharge Planning;Psychosocial Support;Patient/Family Education    Team Discharge Planning: Destination: PT-Home ,OT- Home , SLP-  Projected Follow-up: PT-Home health PT, OT-  Home health OT, SLP-  Projected Equipment Needs: PT-To be determined, OT- None recommended by OT, SLP-  Equipment Details: PT-had borrowed rollator; will need one of his own;, OT-pt has shower chair, grab bars, and elevated toilet Patient/family involved in discharge planning: PT- Patient;Family member/caregiver,  OT-Patient;Family member/caregiver, SLP-   MD ELOS: 12-14 days Medical Rehab Prognosis:  Excellent Assessment: The patient has been admitted for CIR therapies with the diagnosis of L3 fracture, radiculopathy. The team will be addressing functional mobility, strength, stamina, balance, safety, adaptive techniques and equipment, self-care, bowel and bladder mgt, patient and caregiver education, pain mgt, egosupport, NMR, skin care, leisure awareness, community reintegration. Goals have been set at supervision for household ambulation and mod I for basic transfers and self-care.Ranelle Oyster, MD, FAAPMR      See Team Conference Notes for weekly updates to the plan of care

## 2013-12-29 NOTE — Plan of Care (Signed)
Problem: SCI BLADDER ELIMINATION Goal: RH STG MANAGE BLADDER WITH ASSISTANCE STG Manage Bladder With mod Assistance  Outcome: Not Progressing Coude foley cath for urinary retention

## 2013-12-29 NOTE — Progress Notes (Signed)
Spoke with Dr. Pamelia Hoit nurse--last BUN/Cr- 55/2.9. Dr. Leretha Dykes recommended renal as well as hem/Onc follow up for full work up. She has only seen patient once in the office.Marland Kitchen

## 2013-12-29 NOTE — Progress Notes (Signed)
Physical Therapy Session Note  Patient Details  Name: Lance [name] MRN: [id number] Date of Birth: 1940/05/12  Today's Date: 12/29/2013 PT Individual Time: 1000-1100 PT Individual Time Calculation (min): 60 min   Short Term Goals: Week 1:  PT Short Term Goal 1 (Week 1): patient will perform bed mobility with rails and supervision PT Short Term Goal 2 (Week 1): patient will perform sit to stand and stand pivot transfer bed <> wheelchair with supervsion PT Short Term Goal 3 (Week 1): Patient will propel wheelchair 100 feet with supervision in comtrolled environment PT Short Term Goal 4 (Week 1): Patient will ambuate 75 feet with rolling walker and min steady assist in controlled environment  Skilled Therapeutic Interventions/Progress Updates:   Session focused on bed mobility while maintaining back precautions, activity tolerance, sit <> stand, and w/c mobility for UE strengthening and endurance. Pt able to recall 1/3 back precautions. Requires mod A to transfer supine > sit with cues for maintaining precautions and sequencing. Total A to don lumbar corset sitting EOB. Pt performed stand pivot transfer bed > w/c with min A. W/c mobility using BUEs 2 x 150 ft, overall supervision and cues for efficiency/technique. Pt reporting increase in LLE pain since yesterday. For improved ROM and use of LLE, pt performed seated LAQ and alt hip flexion to fatigue with BLEs. Pt performed sit <> stand in // bars with supervision with focus on increasing tolerance to standing and verbal cues for hip/knee extension and upright posture. Pt able to maintain standing up to 90 sec before requiring seated rest. Pt demo within session improvement with tolerance to WB through LLE and posture in standing. Pt returned to room and left sitting in w/c for OT session, partner and visitor in room.  Therapy Documentation Precautions:  Precautions Precautions: Back;Fall Precaution Comments: pt only able to report 1/3  precautions, reviewed verbally Required Braces or Orthoses: Spinal Brace Spinal Brace: Lumbar corset;Applied in sitting position Restrictions Weight Bearing Restrictions: No Pain: Pain Assessment Pain Assessment: 0-10 Pain Score: 9  Pain Type: Acute pain Pain Location: Leg Pain Orientation: Right;Proximal Pain Radiating Towards: RLE Pain Descriptors / Indicators: Grimacing;Throbbing;Discomfort Pain Frequency: Intermittent Pain Onset: On-going Patients Stated Pain Goal: 3 Pain Intervention(s): Emotional support;Rest  See FIM for current functional status  Therapy/Group: Individual Therapy  Kerney Elbe 12/29/2013, 11:03 AM

## 2013-12-29 NOTE — Consult Note (Signed)
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 Joesph July in [facility] Admitted to the hospital 8 days ago for severe lower back pain with radiation to the right anterior thigh found to be due to L3 compression fracture with stenosis at L3 nerve root compression. Consequently underwent L2-3 decompressive laminotomy with L2 and L3 decompressive foraminotomies on 12/22/13.  Noted to have had episodes of hypotension postoperatively most prominently on 8/22 and 8/23 with intermittently low blood pressures thereafter. He also has had urinary retention after Foley catheter removal on 8/22 and again on 8/26 prompting reinsertion of the Foley catheter. The patient reports that he has not been eating and drinking as well as he would want to. Had a bowel movement about 2 days ago. Denies any nausea or vomiting and does not have any dysgeusia or tremor. No intravenous contrast exposure and no use of nonsteroidal anti-inflammatory drugs noted.   12/21/2013  12/22/2013  12/25/2013  12/28/2013  12/29/2013   BUN 39 (H) 45 (H) 69 (H) 88 (H) 98 (H)  Creatinine 2.42 (H) 2.53 (H) 2.84 (H) 2.86 (H) 3.33 (H)    Past Medical History  Diagnosis Date  . GERD (gastroesophageal reflux disease)   . Arthritis     osteoarthritis  . Hypothyroidism   . Chronic kidney disease     bph,kidney bx,renal insuff.  . Anxiety   . Weight decrease     20lbs    Past Surgical History  Procedure Laterality Date  . Rotator cuff repair  2004    right rotator cuff repair  . 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 Hand surgery    . Renal biopsy    . Laminectomy      L2 L3  WITH OPEN KYPHOPLASTY  . Lumbar laminectomy/decompression microdiscectomy Right 12/22/2013    Procedure: Right L/2-3LUMBAR DECOMPRESSION 1 LEVEL ;  Surgeon: Charlie Pitter, MD;  Location: [facility];  Service: Neurosurgery;  Laterality: Right;  Right L/2-3LUMBAR DECOMPRESSION 1 LEVEL    No family history on file.  Social History:  reports that he has never smoked. He has never used smokeless tobacco. He reports that he drinks alcohol. He reports that he does not use illicit drugs.  Allergies: No Known Allergies  Medications:  Scheduled: . antiseptic oral rinse  15 mL Mouth Rinse BID  . enoxaparin (LOVENOX) injection  30 mg Subcutaneous Q24H  . famotidine  10 mg Oral Daily  . feeding supplement (NEPRO CARB STEADY)  237 mL Oral BID BM  . fluconazole  100 mg Oral Daily  . gabapentin  200 mg Oral TID  . levothyroxine  50 mcg Oral QAC breakfast  . multivitamin  1 tablet Oral QHS  . pantoprazole  40 mg Oral BID  . polyethylene glycol  17 g Oral BID  . simvastatin  10 mg Oral q1800  . tamsulosin  0.4 mg Oral BID    Results for orders placed during the hospital encounter of 12/27/13 (from the past 48 hour(s))  URINE CULTURE     Status: None   Collection Time  12/27/13  7:29 PM      Result Value Ref Range   Specimen Description URINE, CLEAN CATCH     Special Requests NONE     Culture  Setup Time       Value: 12/27/2013 20:33     Performed at [facility]       Value: NO GROWTH     Performed at [facility]   Culture       Value: NO GROWTH     Performed at [facility]   Report Status 12/28/2013 FINAL    URINALYSIS, ROUTINE W REFLEX MICROSCOPIC     Status: Abnormal   Collection Time    12/27/13  7:29 PM      Result Value Ref Range   Color, Urine AMBER (*) YELLOW   Comment: BIOCHEMICALS MAY BE AFFECTED BY COLOR   APPearance  CLOUDY (*) CLEAR   Specific Gravity, Urine 1.013  1.005 - 1.030   pH 5.0  5.0 - 8.0   Glucose, UA NEGATIVE  NEGATIVE mg/dL   Hgb urine dipstick LARGE (*) NEGATIVE   Bilirubin Urine NEGATIVE  NEGATIVE   Ketones, ur NEGATIVE  NEGATIVE mg/dL   Protein, ur 30 (*) NEGATIVE mg/dL   Urobilinogen, UA 0.2  0.0 - 1.0 mg/dL   Nitrite NEGATIVE  NEGATIVE   Leukocytes, UA SMALL (*) NEGATIVE  URINE MICROSCOPIC-ADD ON     Status: None   Collection Time    12/27/13  7:29 PM      Result Value Ref Range   Squamous Epithelial / LPF RARE  RARE   WBC, UA 0-2  <3 WBC/hpf   RBC / HPF TOO NUMEROUS TO COUNT  <3 RBC/hpf   Bacteria, UA RARE  RARE  CBC WITH DIFFERENTIAL     Status: Abnormal   Collection Time    12/28/13  6:30 AM      Result Value Ref Range   WBC 13.0 (*) 4.0 - 10.5 K/uL   RBC 3.73 (*) 4.22 - 5.81 MIL/uL   Hemoglobin 10.7 (*) 13.0 - 17.0 g/dL   HCT 32.6 (*) 39.0 - 52.0 %   MCV 87.4  78.0 - 100.0 fL   MCH 28.7  26.0 - 34.0 pg   MCHC 32.8  30.0 - 36.0 g/dL   RDW 15.1  11.5 - 15.5 %   Platelets 483 (*) 150 - 400 K/uL   Neutrophils Relative % 74  43 - 77 %   Lymphocytes Relative 14  12 - 46 %   Monocytes Relative 8  3 - 12 %   Eosinophils Relative 4  0 - 5 %   Basophils Relative 0  0 - 1 %   Neutro Abs 9.7 (*) 1.7 - 7.7 K/uL   Lymphs Abs 1.8  0.7 - 4.0 K/uL   Monocytes Absolute 1.0  0.1 - 1.0 K/uL   Eosinophils Absolute 0.5  0.0 - 0.7 K/uL   Basophils Absolute 0.0  0.0 - 0.1 K/uL   RBC Morphology POLYCHROMASIA PRESENT     WBC Morphology MILD LEFT SHIFT (1-5% METAS, OCC MYELO, OCC BANDS)     Smear Review PLATELETS APPEAR INCREASED    COMPREHENSIVE METABOLIC PANEL     Status: Abnormal   Collection Time    12/28/13  6:30 AM      Result Value Ref Range   Sodium 136 (*) 137 - 147 mEq/L   Potassium 5.4 (*) 3.7 - 5.3 mEq/L   Chloride  99  96 - 112 mEq/L   CO2 20  19 - 32 mEq/L   Glucose, Bld 86  70 - 99 mg/dL   BUN 88 (*) 6 - 23 mg/dL   Creatinine, Ser 2.86 (*) 0.50 - 1.35 mg/dL    Calcium 10.4  8.4 - 10.5 mg/dL   Total Protein 6.3  6.0 - 8.3 g/dL   Albumin 2.6 (*) 3.5 - 5.2 g/dL   AST 31  0 - 37 U/L   ALT 22  0 - 53 U/L   Alkaline Phosphatase 216 (*) 39 - 117 U/L   Total Bilirubin 0.5  0.3 - 1.2 mg/dL   GFR calc non Af Amer 21 (*) >90 mL/min   GFR calc Af Amer 24 (*) >90 mL/min   Comment: (NOTE)     The eGFR has been calculated using the CKD EPI equation.     This calculation has not been validated in all clinical situations.     eGFR's persistently <90 mL/min signify possible Chronic Kidney     Disease.   Anion gap 17 (*) 5 - 15  BASIC METABOLIC PANEL     Status: Abnormal   Collection Time    12/29/13  6:16 AM      Result Value Ref Range   Sodium 133 (*) 137 - 147 mEq/L   Potassium 5.3  3.7 - 5.3 mEq/L   Chloride 97  96 - 112 mEq/L   CO2 19  19 - 32 mEq/L   Glucose, Bld 91  70 - 99 mg/dL   BUN 98 (*) 6 - 23 mg/dL   Creatinine, Ser 3.33 (*) 0.50 - 1.35 mg/dL   Calcium 9.7  8.4 - 10.5 mg/dL   GFR calc non Af Amer 17 (*) >90 mL/min   GFR calc Af Amer 20 (*) >90 mL/min   Comment: (NOTE)     The eGFR has been calculated using the CKD EPI equation.     This calculation has not been validated in all clinical situations.     eGFR's persistently <90 mL/min signify possible Chronic Kidney     Disease.   Anion gap 17 (*) 5 - 15    No results found.  Review of Systems  Constitutional: Positive for malaise/fatigue. Negative for fever and chills.  HENT: Negative.   Eyes: Negative.   Respiratory: Negative.   Cardiovascular: Negative.   Gastrointestinal: Negative for nausea, vomiting and diarrhea.       Poor appetite  Genitourinary:       See HPI---urine retention s/p removal of foley  Musculoskeletal: Positive for back pain.  Skin: Negative.   Neurological: Positive for weakness. Negative for dizziness, tingling and tremors.  Psychiatric/Behavioral: The patient is nervous/anxious.    Blood pressure 99/66, pulse 100, temperature 98 F (36.7 C),  temperature source Oral, resp. rate 16, SpO2 98.00%. Physical Exam  Nursing note and vitals reviewed. Constitutional: He is oriented to person, place, and time. He appears well-developed.  Cachectic appearing  HENT:  Head: Normocephalic and atraumatic.  Nose: Nose normal.  Mouth/Throat: No oropharyngeal exudate.  Angular cheilosis with dry oral mucosa  Eyes: Conjunctivae and EOM are normal. Pupils are equal, round, and reactive to light. No scleral icterus.  Neck: Normal range of motion. Neck supple. No JVD present.  Cardiovascular: Normal rate, regular rhythm and normal heart sounds.  Exam reveals no friction rub.   No murmur heard. Respiratory: Effort normal and breath sounds normal. No respiratory distress. He has no wheezes. He  has no rales.  GI: Soft. Bowel sounds are normal. He exhibits no distension. There is no tenderness. There is no rebound.  Musculoskeletal: He exhibits no edema and no tenderness.  Lymphadenopathy:    He has no cervical adenopathy.  Neurological: He is alert and oriented to person, place, and time. No cranial nerve deficit.  Skin: Skin is warm and dry. No erythema.  Psychiatric: He has a normal mood and affect. His behavior is normal.    Assessment/Plan: 1. Acute renal failure on chronic kidney disease stage IV: Appears to be consistent with hemodynamically mediated acute renal failure/ischemic ATN with transient episodes of hypotension and cannot rule out the smaller role of bladder outlet obstruction. Clinically, he appears to be volume depleted and admits to poor oral intake. He is currently non-oliguric and with mild hyperkalemia that will be medically managed. Will check a repeat urinalysis, renal ultrasound and urine electrolytes. Continue to avoid hypotension and intravenous contrast/nonsteroidal anti-inflammatory drugs. 2. Hyperkalemia: This appears to be chronic possibly from tubular defects associated with amyloidosis. Currently may also be secondary to  acute renal failure, will give a small dose of Kayexalate today. Not on exogenous potassium supplementation, his potassium is also likely affected by ongoing Lovenox. 3. Hyponatremia: Suspected this might be chronic with paraproteinemia of amyloidosis, worsening maybe secondary to free water excretion defect of acute renal failure, start fluid restriction 1200 cc per 24 hours. Check TSH. 4. Status post lumbar surgery: Further management per inpatient rehabilitation team 5. Hypotension: Blood pressure currently within fair limits, who give intravenous fluids overnight.  Walker Sitar K. 12/29/2013, 3:55 PM

## 2013-12-29 NOTE — Progress Notes (Signed)
Occupational Therapy Session Note  Patient Details  Name: Lance [name] MRN: [id number] Date of Birth: 09/11/1940  Today's Date: 12/29/2013 OT Individual Time: 1100-1200 OT Individual Time Calculation (min): 60 min    Short Term Goals: Week 1:  OT Short Term Goal 1 (Week 1): Patient will complete toilet transfer at supervision level OT Short Term Goal 2 (Week 1): Patient will complete LB dressing with min assist with use of AE, prn OT Short Term Goal 3 (Week 1): Patient will complete 1 grooming task in standing with SBA  Skilled Therapeutic Interventions/Progress Updates:  Patient received seated in w/c with lumbar corset donned, family in room, and 10/10 complaints of pain in RLE. RN aware of pain. Therapist propelled patient into bathroom and patient performed stand pivot transfer with min assist from w/c> tub transfer bench. Patient doffed clothes and doffed lumbar corset for shower. Patient completed UB/LB bathing with mod assist in seated position secondary to brace wearing orders, requiring assist for buttock & bilateral feet; therapist administered LH sponge at end of session to increase independence with LB bathing. Patient dried, donned shirt, then donned lumbar corset. Patient transferred bench>w/c with min assist and therapist assisted patient > room for LB dressing tasks. Therapist introduced reacher to increase independence with LB dressing. Patient sat at sink for grooming tasks of brushing teeth, combing hair, and shaving. Patient's significant other present at end of session. Patient left seated in w/c at sink finishing grooming tasks. Patient with increased confusion during session, RN made aware.   Precautions:  Precautions Precautions: Back;Fall Precaution Comments: pt only able to report 1/3 precautions, reviewed verbally Required Braces or Orthoses: Spinal Brace Spinal Brace: Lumbar corset;Applied in sitting position Restrictions Weight Bearing Restrictions: No  See  FIM for current functional status  Therapy/Group: Individual Therapy  [name] 12/29/2013, 12:03 PM

## 2013-12-29 NOTE — Progress Notes (Signed)
Physical Therapy Session Note  Patient Details  Name: Lance [name] MRN: [id number] BIELirth: 1941/03/23  Today's Date: 12/29/2013 PT Individual Time: 1402-1502 PT Individual Time Calculation (min): 60 min   Short Term Goals: Week 1:  PT Short Term Goal 1 (Week 1): patient will perform bed mobility with rails and supervision PT Short Term Goal 2 (Week 1): patient will perform sit to stand and stand pivot transfer bed <> wheelchair with supervsion PT Short Term Goal 3 (Week 1): Patient will propel wheelchair 100 feet with supervision in comtrolled environment PT Short Term Goal 4 (Week 1): Patient will ambuate 75 feet with rolling walker and min steady assist in controlled environment  Skilled Therapeutic Interventions/Progress Updates:   Session focused on bed mobility requiring cues to maintain precautions and mod A to lift and lower LEs, basic transfers with RW with close S/min A, gait training with RW with focus on endurance and posture with min A, Nustep for general strengthening and endurance and loosen up LLE (x 10 min on level 4) to which pt tolerated well, and w/c propulsion for general UE strengthening and endurance. Pt requires extra time for mobility and cues to adhere to back precautions. Returned to bed end of session.  Therapy Documentation Precautions:  Precautions Precautions: Back;Fall Precaution Comments: pt only able to report 1/3 precautions, reviewed verbally Required Braces or Orthoses: Spinal Brace Spinal Brace: Lumbar corset;Applied in sitting position Restrictions Weight Bearing Restrictions: No  Pain:  Reports pain shooting down RLE - premedicated and repositioned as needed.  See FIM for current functional status  Therapy/Group: Individual Therapy  Karolee Stamps [facility] 12/29/2013, 3:54 PM

## 2013-12-30 ENCOUNTER — Inpatient Hospital Stay (HOSPITAL_COMMUNITY): Payer: Medicare Other

## 2013-12-30 ENCOUNTER — Inpatient Hospital Stay (HOSPITAL_COMMUNITY): Payer: Medicare Other | Admitting: Physical Therapy

## 2013-12-30 DIAGNOSIS — M4716 Other spondylosis with myelopathy, lumbar region: Secondary | ICD-10-CM

## 2013-12-30 DIAGNOSIS — IMO0002 Reserved for concepts with insufficient information to code with codable children: Secondary | ICD-10-CM

## 2013-12-30 DIAGNOSIS — Z5189 Encounter for other specified aftercare: Principal | ICD-10-CM

## 2013-12-30 LAB — RENAL FUNCTION PANEL
ALBUMIN: 2.4 g/dL — AB (ref 3.5–5.2)
ANION GAP: 16 — AB (ref 5–15)
BUN: 98 mg/dL — AB (ref 6–23)
CALCIUM: 8.6 mg/dL (ref 8.4–10.5)
CO2: 20 mEq/L (ref 19–32)
CREATININE: 3.96 mg/dL — AB (ref 0.50–1.35)
Chloride: 98 mEq/L (ref 96–112)
GFR calc Af Amer: 16 mL/min — ABNORMAL LOW (ref >90)
GFR calc non Af Amer: 14 mL/min — ABNORMAL LOW (ref >90)
Glucose, Bld: 110 mg/dL — ABNORMAL HIGH (ref 70–99)
PHOSPHORUS: 5.7 mg/dL — AB (ref 2.3–4.6)
Potassium: 4 mEq/L (ref 3.7–5.3)
Sodium: 134 mEq/L — ABNORMAL LOW (ref 137–147)

## 2013-12-30 NOTE — Progress Notes (Signed)
Physical Therapy Session Note  Patient Details  Name: Lance [name] MRN: [id number] Date of Birth: 06-10-1940  Today's Date: 12/30/2013 PT Individual Time: 1000-1100 Tx 2: 1300-1400 PT Individual Time Calculation (min): 60 min  Tx 2: 60 min  Short Term Goals: Week 1:  PT Short Term Goal 1 (Week 1): patient will perform bed mobility with rails and supervision PT Short Term Goal 2 (Week 1): patient will perform sit to stand and stand pivot transfer bed <> wheelchair with supervsion PT Short Term Goal 3 (Week 1): Patient will propel wheelchair 100 feet with supervision in comtrolled environment PT Short Term Goal 4 (Week 1): Patient will ambuate 75 feet with rolling walker and min steady assist in controlled environment  Skilled Therapeutic Interventions/Progress Updates:    Therapeutic Exercise: PT instructs pt in mat level exercises: heel slides, hip IR in B hook lie x 10 reps each LE. PT instructs pt to squeeze abdominal muscles during R heel slides and pt reports this significantly reduces his pain.   Orthotic Fit Training: PT notes that pt's LSO brace has ridden up on pt's spine. While pt is sitting on edge of mat, PT adjusts pt's LSO so that it wraps around pt's lumbar spine and then instructs pt in how to tighten LSO with B pulleys x 2 reps. Pt demonstrates understanding of this step.   W/C Management: PT instructs pt in self propelling manual w/c with B UE x 150' with verbal cues for steering/amount of pushing with each UE.   Gait Training: PT instructs pt in ambulation with RW x 50' req min A for safety and repeated verbal cues for upright posture.   PT spent a lot of time educating pt during this session. PT instructs pt re: the importance of bracing with abdominal muscles during movements to assist in stabilizing the spine, how slouched/sacral sitting position is a violation of spinal precautions because it is analogous to flexing the spine, in how to log roll safely, in how to  tighten LSO brace, and what his spinal precautions entail. Pt has very low activity tolerance and pain appears to be his primary limitation. IV and foley catheter attached to pt during session; pt's partner was helpful in managing these devices during first session.   Session 2: Therapeutic Exercise:  PT instructs pt in strengthening and ROM exercises to B LE and core: isometric abdominal squeezes x 10 reps, heel slides with abdominal bracing x 10 reps each side, side lie hip abduction with abdominal bracing x 10 reps  Therapeutic Activity: PT instructs pt in log roll with abdominal bracing req SBA and verbal cues x 2 reps R and L, L side lie to sit req significantly increased time, verbal cues for sequencing, and CGA, sit to L side lie req mod A for B LEs but this time pt almosts gets both legs onto the bed by himself. PT instructs pt in R side lie to sit req CGA and verbal cues for technique, sit to R side lie req mod A for B LEs. PT instructs pt in scooting up in bed and laterally in bed req min A to stabilize feet.   Pt moves very slow, but abdominal bracing prior to movement appears to decrease/manage his pain and anxiety levels. Pt will benefit from continued progressive mobility as his pain tolerates it. Pt appears to have difficulty remembering safe technique to move with decreased pain; monitor this for progress in learning.   Therapy Documentation Precautions:  Precautions Precautions:  Back;Fall Precaution Comments: pt only able to report 1/3 precautions, reviewed verbally Required Braces or Orthoses: Spinal Brace Spinal Brace: Lumbar corset;Applied in sitting position Restrictions Weight Bearing Restrictions: No Vital Signs: Therapy Vitals Pulse Rate: 106 (after gait) BP: 109/61 mmHg Patient Position (if appropriate): Sitting Oxygen Therapy SpO2: 99 % O2 Device: None (Room air) Pain: Pain Assessment Pain Assessment: 0-10 Pain Score: 8  Pain Type: Surgical pain Pain  Location: Back Pain Orientation: Lower Pain Descriptors / Indicators: Radiating (R leg) Pain Onset: On-going Pain Intervention(s): Rest;Repositioned Multiple Pain Sites: No Tx 2: Pt c/o 7/10 pain in low back, that radiates down R leg tingling. PT utilizes rest and repositioning to lower pain.  See FIM for current functional status  Therapy/Group: Individual Therapy  Zenobia Kuennen M 12/30/2013, 10:07 AM

## 2013-12-30 NOTE — Progress Notes (Signed)
Patient ID: Lance [name], male   DOB: 21-Aug-1940, 73 y.o.   MRN: [id number]  [facility] Progress Note    Assessment/ Plan:   1. Acute renal failure on chronic kidney disease stage IV: Appears to be primarily hemodynamic from intermittent episodes of hypotension postoperatively and with a minor role from his bladder outlet obstruction (renal ultrasound does not show any hydronephrosis). Placed on intravenous IV fluids yesterday do to evidence of volume depletion and awaiting labs from this morning. Excellent urine output overnight. 2. Hyperkalemia: This appears to be chronic possibly from tubular defects associated with amyloidosis. Status post Kayexalate therapy, monitor with intravenous fluids. 3. Hyponatremia: Suspected this might be chronic with paraproteinemia of amyloidosis, worsening maybe secondary to free water excretion defect of acute renal failure. TSH within normal limits, discussed fluid restriction.  4. Status post lumbar surgery: Further management per inpatient rehabilitation team  5. Hypotension: Blood pressure currently within fair limits, who give intravenous fluids overnight.  Subjective:   Continues to have back pain that gets significantly aggravated by movement-increased apprehension of the bowel movements. Discussed rectal tube earlier with CIR physician    Objective:   BP 101/64  Pulse 100  Temp(Src) 98.9 F (37.2 C) (Oral)  Resp 17  SpO2 94%  Intake/Output Summary (Last 24 hours) at 12/30/13 1227 Last data filed at 12/30/13 4098  Gross per 24 hour  Intake    480 ml  Output   1750 ml  Net  -1270 ml   Weight change:   Physical Exam: Gen: Appears to be comfortable resting in bed CVS: Pulse regular in rate and rhythm, S1 and S2 normal Resp: Clear to auscultation bilaterally, no rales/rhonchi Abd: Soft, flat, nontender Ext: no lower extremity edema  Imaging: US Renal  12/29/2013   CLINICAL DATA:  Acute renal failure  EXAM:  RENAL/URINARY TRACT ULTRASOUND COMPLETE  COMPARISON:  12/16/2013  FINDINGS: Right Kidney:  Length: 10.5 cm. A small parapelvic cyst is noted similar to that seen on prior CTs.  Left Kidney:  Length: 1.6 cm. Changes from the previous biopsy are no longer identified.  Bladder:  Partially decompressed by Foley catheter.  IMPRESSION: No acute abnormality is noted.  Stable right parapelvic cyst.   Electronically Signed   By: Alcide Clever M.D.   On: 12/29/2013 19:10    Labs: BMET  Recent Labs Lab 12/25/13 1347 12/28/13 0630 12/29/13 0616  NA 129* 136* 133*  K 5.2 5.4* 5.3  CL 93* 99 97  CO2 GLUCOSE 163* 86 91  BUN 69* 88* 98*  CREATININE 2.84* 2.86* 3.33*  CALCIUM 10.8* 10.4 9.7   CBC  Recent Labs Lab 12/28/13 0630  WBC 13.0*  NEUTROABS 9.7*  HGB 10.7*  HCT 32.6*  MCV 87.4  PLT 483*   Medications:    . antiseptic oral rinse  15 mL Mouth Rinse BID  . enoxaparin (LOVENOX) injection  30 mg Subcutaneous Q24H  . famotidine  10 mg Oral Daily  . feeding supplement (NEPRO CARB STEADY)  237 mL Oral BID BM  . fluconazole  100 mg Oral Daily  . gabapentin  200 mg Oral TID  . levothyroxine  50 mcg Oral QAC breakfast  . multivitamin  1 tablet Oral QHS  . pantoprazole  40 mg Oral BID  . polyethylene glycol  17 g Oral BID  . simvastatin  10 mg Oral q1800  . tamsulosin  0.4 mg Oral BID   Zetta Bills, MD 12/30/2013, 12:27 PM

## 2013-12-30 NOTE — Progress Notes (Signed)
Occupational Therapy Session Note  Patient Details  Name: Lance [name] MRN: [id number] Date of Birth: October 07, 1940  Today's Date: 12/30/2013 OT Individual Time: 0800-0900 OT Individual Time Calculation (min): 60 min    Short Term Goals: Week 1:  OT Short Term Goal 1 (Week 1): Patient will complete toilet transfer at supervision level OT Short Term Goal 2 (Week 1): Patient will complete LB dressing with min assist with use of AE, prn OT Short Term Goal 3 (Week 1): Patient will complete 1 grooming task in standing with SBA  Skilled Therapeutic Interventions/Progress Updates: ADL-retraining with focus on improved bed mobility, sit<>stand, weight-shifting (posterior>anterior), family re-ed with significant other [name]), and dynamic standing balance during ADL.   Pt received in bed, HOB elevated while self-feeding.   Pt recalled 3 of 3 back precautions but required re-ed on log rolling to reduce risk of "twisting" trunk.   Pt and SO were re-educated on method of setup with orthosis to insure proper snug fit as patient was unable to maintain erect posture due to trunk weakness and posterior pelvic tilt when seated while attempting to tighten support bands.  Pt completed sit>stand at edge of bed with only steadying assist after mod assist to roll to his left using log roll and pillow between his legs.    Pt completed stand pivot transfer to w/c in prep for seated ADL (supported at sink) with facilitation for hand and foot placement during transfer d/t fearfulness with falls and posterior lean.   Pt completed upper body BADL with only setup assist and stood to clean buttocks and peri-area, with mod assist to maintain balance although able to support himself with left arm for approx 20 seconds before becoming fatigued.   Pt was unable to complete bathing of lower body below knees this date due to time limitations with treatment but elected to allow SO to assist after end of session.    SO advised to contact  RN tech when finished bathing pts lower legs and feet for assist with donning pants.    Therapy Documentation Precautions:  Precautions Precautions: Back;Fall Precaution Comments: pt only able to report 1/3 precautions, reviewed verbally Required Braces or Orthoses: Spinal Brace Spinal Brace: Lumbar corset;Applied in sitting position Restrictions Weight Bearing Restrictions: No  Pain: Pain Assessment Pain Assessment: 0-10 Pain Score: 8  Pain Type: Surgical pain Pain Location: Back Pain Orientation: Lower Pain Descriptors / Indicators: Radiating (R leg) Pain Onset: On-going Pain Intervention(s): Rest;Repositioned Multiple Pain Sites: No  See FIM for current functional status  Therapy/Group: Individual Therapy  [name] 12/30/2013, 10:44 AM

## 2013-12-30 NOTE — Progress Notes (Signed)
Patient ID: Lance [name], male   DOB: Mar 13, 1941, 73 y.o.   MRN: [id number]    [place] [facility]     PROGRESS NOTE   12/30/13.  Subjective/Complaints: 73 year old patient who is admitted to rehabilitation with functional deficits secondary to Rigth L3 radiculopathy , L3 compression fx s/p decompression/kyphoplasty   Requesting a condom cath due to severe leg discomfort with movement  Past Medical History  Diagnosis Date  . GERD (gastroesophageal reflux disease)   . Arthritis     osteoarthritis  . Hypothyroidism   . Chronic kidney disease     bph,kidney bx,renal insuff.  . Anxiety   . Weight decrease     20lbs     Objective: Vital Signs: Blood pressure 101/64, pulse 100, temperature 98.9 F (37.2 C), temperature source Oral, resp. rate 17, SpO2 94.00%.   Recent Labs  12/28/13 0630  WBC 13.0*  HGB 10.7*  HCT 32.6*  PLT 483*    Recent Labs  12/28/13 0630 12/29/13 0616  NA 136* 133*  K 5.4* 5.3  CL 99 97  GLUCOSE 86 91  BUN 88* 98*  CREATININE 2.86* 3.33*  CALCIUM 10.4 9.7   CBG (last 3)  No results found for this basename: GLUCAP,  in the last 72 hours  Wt Readings from Last 3 Encounters:  12/22/13 62.5 kg (137 lb 12.6 oz)  12/22/13 62.5 kg (137 lb 12.6 oz)  12/21/13 62.869 kg (138 lb 9.6 oz)      Patient Vitals for the past 24 hrs:  BP Temp Temp src Pulse Resp SpO2  12/30/13 0611 101/64 mmHg 98.9 F (37.2 C) Oral 100 17 94 %  12/29/13 2221 98/63 mmHg 98.9 F (37.2 C) Oral 103 17 94 %  12/29/13 1350 99/66 mmHg 98 F (36.7 C) Oral 100 16 98 %     Physical Exam:  Constitutional: He is oriented to person, place, and time. He appears well-developed and well-nourished.  HENT: oral mucosa pink/moist  Head: Normocephalic and atraumatic.  Eyes: Conjunctivae are normal. Pupils are equal, round, and reactive to light.  Neck: Normal range of motion. Neck supple.  Cardiovascular: Normal rate and regular rhythm.   Respiratory: Effort normal and breath sounds normal. No respiratory distress. He has no wheezes.  GI: He exhibits distension. Bowel sounds are increased. There is no tenderness.  Neurological: He is alert and oriented to person, place, and time.  Speech dysarthric. Follows commands without difficulty. UES: RUE: deltoid 5/5, bicep 5/5, tricep 5/5, wrist ext 5/5, hand intrinsics 5/5 LUE: deltoid 5/5, bicep 5/5, triceps 5/5, wrist ext 5/5, hand instrincs 5/5 RLE: Hip 2/5 pain, Isolated KE 2/5. HAD 3+/5. Ankle 4+/5 LLE: Hip 3+, KE 4/5, ankle 4/5.  Sensory changes over the anterior-lateral thigh to right knee. DTR's 1+  Skin: Skin is warm and dry.surgical site intact  M/S: back tender to touch and with proximal LE movement  Psychiatric: He has a normal mood and affect. His behavior is normal. Judgment and thought content normal   Assessment/Plan: 1. Functional deficits secondary to L3 fx and associated radiculopathy which require 3+ hours per day of interdisciplinary therapy in a comprehensive inpatient rehab setting. 2. DVT Prophylaxis/Anticoagulation: Pharmaceutical: Lovenox  3. Pain Management: limited with Gabapentin to renal issues. Limit narcotics d/t lethargy (as well as confusion per partner)  4. Acute on chronic renal failure: Chronic Hyponatremia with Na-128, BUN/Cr- 39/2.42 at admission. Need to push po fluids. Discontinue fluid restrictions. Continue renal diet. Strict I and O  with I/O caths to keep bladder decompressed. H/o BPH on recent renal u/s.   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    LOS (Days) 3 A FACE TO FACE EVALUATION WAS PERFORMED  Rogelia Boga 12/30/2013 9:01 AM

## 2013-12-31 ENCOUNTER — Inpatient Hospital Stay (HOSPITAL_COMMUNITY): Payer: Medicare Other

## 2013-12-31 LAB — RENAL FUNCTION PANEL
Albumin: 2.2 g/dL — ABNORMAL LOW (ref 3.5–5.2)
Anion gap: 16 — ABNORMAL HIGH (ref 5–15)
BUN: 92 mg/dL — ABNORMAL HIGH (ref 6–23)
CO2: 20 mEq/L (ref 19–32)
Calcium: 9 mg/dL (ref 8.4–10.5)
Chloride: 102 mEq/L (ref 96–112)
Creatinine, Ser: 3.86 mg/dL — ABNORMAL HIGH (ref 0.50–1.35)
GFR calc Af Amer: 17 mL/min — ABNORMAL LOW (ref >90)
GFR calc non Af Amer: 14 mL/min — ABNORMAL LOW (ref >90)
Glucose, Bld: 91 mg/dL (ref 70–99)
Phosphorus: 5 mg/dL — ABNORMAL HIGH (ref 2.3–4.6)
Potassium: 4.1 mEq/L (ref 3.7–5.3)
Sodium: 138 mEq/L (ref 137–147)

## 2013-12-31 MED ORDER — ACETAMINOPHEN 325 MG PO TABS
325.0000 mg | ORAL_TABLET | ORAL | Status: DC | PRN
  Administered 2013-12-31 – 2014-01-01 (×2): 650 mg via ORAL
  Filled 2013-12-31 (×2): qty 2

## 2013-12-31 NOTE — Progress Notes (Signed)
Patient ID: Lance [name], male   DOB: [birth year], 73 y.o.   MRN: [id number]  [facility] Progress Note    Assessment/ Plan:   1. Acute renal failure on chronic kidney disease stage IV: Appears to be primarily hemodynamic from intermittent episodes of hypotension postoperatively and with a minor role from his bladder outlet obstruction (renal ultrasound does not show any hydronephrosis). Placed on intravenous IV fluids (FENa less than 1% and evidence of volume depletion) and labs indicative of slight improvement of renal function versus plateau phase of ATN. He continues to have good urine output overnight and no acute dialysis needs. 2. Hyperkalemia: This appears to be chronic possibly from tubular defects associated with amyloidosis. Status post Kayexalate therapy, monitor with intravenous fluids. 3. Hyponatremia: Suspected this might be chronic with paraproteinemia of amyloidosis, worsening maybe secondary to free water excretion defect of acute renal failure. TSH within normal limits. Continue to monitor on intravenous normal saline. 4. Status post lumbar surgery: Further management per inpatient rehabilitation team  5. 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 The patient's partner who is also his medical power of attorney Lance [name] [phone]) states that Dr.Zekan has been kept up-to-date with the patient's current hospitalization and recommended "bone biopsy" (I suspect that this is indeed a bone marrow biopsy and would recommend for the primary service to contact Dr.Zekan on Monday to confirm and order this while he is an in-patient to help facilitate his outpatient management/treatment of amyloidosis)  Subjective:   Reports that he is feeling slightly better this morning with improvement of appetite-participating in physical therapy with a lot of encouragement from his partner     Objective:   BP 96/59  Pulse 79  Temp(Src) 98.5 F (36.9 C) (Oral)  Resp 18  SpO2 95%  Intake/Output Summary (Last 24 hours) at 12/31/13 0948 Last data filed at 12/31/13 0900  Gross per 24 hour  Intake    840 ml  Output   1500 ml  Net   -660 ml   Weight change:   Physical Exam: Gen: Appears to be comfortable resting in bed CVS: Pulse regular in rate and rhythm, S1 and S2 normal Resp: Clear to auscultation bilaterally, no rales/rhonchi Abd: Soft, flat, nontender Ext: no lower extremity edema  Imaging: US Renal  12/29/2013   CLINICAL DATA:  Acute renal failure  EXAM: RENAL/URINARY TRACT ULTRASOUND COMPLETE  COMPARISON:  12/16/2013  FINDINGS: Right Kidney:  Length: 10.5 cm. A small parapelvic cyst is noted similar to that seen on prior CTs.  Left Kidney:  Length: 1.6 cm. Changes from the previous biopsy are no longer identified.  Bladder:  Partially decompressed by Foley catheter.  IMPRESSION: No acute abnormality is noted.  Stable right parapelvic cyst.   Electronically Signed   By: Inez Catalina M.D.   On: 12/29/2013 19:10    Labs: BMET  Recent Labs Lab 12/25/13 1347 12/28/13 0630 12/29/13 0616 12/30/13 1152 12/31/13 0625  NA 129* 136* 133* 134* 138  K 5.2 5.4* 5.3 4.0 4.1  CL 93* 99 97 98 102  CO2 _0 GLUCOSE 163* 86 91 110* 91  BUN 69* 88* 98* 98* 92*  CREATININE 2.84* 2.86* 3.33* 3.96* 3.86*  CALCIUM 10.8* 10.4 9.7 8.6 9.0  PHOS  --   --   --  5.7* 5.0*   CBC  Recent Labs Lab 12/28/13 0630  WBC 13.0*  NEUTROABS  9.7*  HGB 10.7*  HCT 32.6*  MCV 87.4  PLT 483*   Medications:    . antiseptic oral rinse  15 mL Mouth Rinse BID  . enoxaparin (LOVENOX) injection  30 mg Subcutaneous Q24H  . famotidine  10 mg Oral Daily  . feeding supplement (NEPRO CARB STEADY)  237 mL Oral BID BM  . fluconazole  100 mg Oral Daily  . gabapentin  200 mg Oral TID  . levothyroxine  50 mcg Oral QAC breakfast  . multivitamin  1 tablet Oral QHS  . pantoprazole   40 mg Oral BID  . polyethylene glycol  17 g Oral BID  . simvastatin  10 mg Oral q1800  . tamsulosin  0.4 mg Oral BID   Elmarie Shiley, MD 12/31/2013, 9:48 AM

## 2013-12-31 NOTE — Progress Notes (Signed)
Patient ID: Zaryan Yakubov, male   DOB: Oct 04, 1940, 73 y.o.   MRN: [id number]  Patient ID: Gerrard Crystal, male   DOB: January 03, 1941, 73 y.o.   MRN: [id number]    [place] [facility]     PROGRESS NOTE   12/31/13.  Subjective/Complaints: 73 year old patient who is admitted to rehabilitation with functional deficits secondary to Rigth L3 radiculopathy , L3 compression fx s/p decompression/kyphoplasty   Continues to have significant  leg discomfort with movement  Past Medical History  Diagnosis Date  . GERD (gastroesophageal reflux disease)   . Arthritis     osteoarthritis  . Hypothyroidism   . Chronic kidney disease     bph,kidney bx,renal insuff.  . Anxiety   . Weight decrease     20lbs     Objective: Vital Signs: Blood pressure 96/59, pulse 79, temperature 98.5 F (36.9 C), temperature source Oral, resp. rate 18, SpO2 95.00%.  No results found for this basename: WBC, HGB, HCT, PLT,  in the last 72 hours  Recent Labs  12/30/13 1152 12/31/13 0625  NA 134* 138  K 4.0 4.1  CL 98 102  GLUCOSE 110* 91  BUN 98* 92*  CREATININE 3.96* 3.86*  CALCIUM 8.6 9.0   CBG (last 3)  No results found for this basename: GLUCAP,  in the last 72 hours  Wt Readings from Last 3 Encounters:  12/22/13 62.5 kg (137 lb 12.6 oz)  12/22/13 62.5 kg (137 lb 12.6 oz)  12/21/13 62.869 kg (138 lb 9.6 oz)      Patient Vitals for the past 24 hrs:  BP Temp Temp src Pulse Resp SpO2  12/31/13 0628 96/59 mmHg 98.5 F (36.9 C) Oral 79 18 95 %  12/30/13 2122 99/60 mmHg 99.2 F (37.3 C) Oral 96 18 93 %  12/30/13 1500 103/55 mmHg 99.5 F (37.5 C) Oral 97 18 97 %  12/30/13 1030 109/61 mmHg - - 106 - 99 %     Physical Exam:  Constitutional: He is oriented to person, place, and time. He appears well-developed and well-nourished.  HENT: oral mucosa pink/moist  Head: Normocephalic and atraumatic.  Eyes: Conjunctivae are normal. Pupils are equal, round, and  reactive to light.  Neck: Normal range of motion. Neck supple.  Cardiovascular: Normal rate and regular rhythm.  Respiratory: Effort normal and breath sounds normal. No respiratory distress. He has no wheezes.  GI: He exhibits distension. Bowel sounds are increased. There is no tenderness.  Neurological: He is alert and oriented to person, place, and time.  GU- F/C in place  Follows commands without difficulty. UES: RUE: deltoid 5/5, bicep 5/5, tricep 5/5, wrist ext 5/5, hand intrinsics 5/5 LUE: deltoid 5/5, bicep 5/5, triceps 5/5, wrist ext 5/5, hand instrincs 5/5 RLE: Hip 2/5 pain, Isolated KE 2/5. HAD 3+/5. Ankle 4+/5 LLE: Hip 3+, KE 4/5, ankle 4/5.  Sensory changes over the anterior-lateral thigh to right knee. DTR's 1+  Skin: Skin is warm and dry.surgical site intact  M/S: back tender to touch and with proximal LE movement  Psychiatric: He has a normal mood and affect. His behavior is normal. Judgment and thought content normal   Assessment/Plan: 1. Functional deficits secondary to L3 fx and associated radiculopathy 2. DVT Prophylaxis/Anticoagulation: Pharmaceutical: Lovenox  3. Pain Management: limited with Gabapentin to renal issues. Limit narcotics d/t lethargy (as well as confusion per partner)  4. Acute on chronic renal failure: Chronic Hyponatremia with Na-128, BUN/Cr- 39/2.42 at admission. Need to push po fluids.  Discontinue fluid restrictions. Continue renal diet. Strict I and O 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    LOS (Days) 4 A FACE TO FACE EVALUATION WAS PERFORMED  Rogelia Boga 12/31/2013 8:45 AM

## 2013-12-31 NOTE — Progress Notes (Signed)
Occupational Therapy Session Note  Patient Details  Name: Lance [name] MRN: [id number] Date of Birth: 1940/10/29  Today's Date: 12/31/2013 OT Individual Time: 1300-1400 OT Individual Time Calculation (min): 60 min    Short Term Goals: Week 1:  OT Short Term Goal 1 (Week 1): Patient will complete toilet transfer at supervision level OT Short Term Goal 2 (Week 1): Patient will complete LB dressing with min assist with use of AE, prn OT Short Term Goal 3 (Week 1): Patient will complete 1 grooming task in standing with SBA  Skilled Therapeutic Interventions/Progress Updates:    Pt resting in bed upon arrival and agreeable to participating in therapy.  Pt practiced bed mobility in ADL apartment.  Pt amb with RW approx 5' to sit EOV in preparation to completing sit->supine onto right side.  Pt required assistance bringing RLE onto bed.  Pt completed task incorporating log roll to position in supine.  Pt performed supine->sit incorporating log roll with supervision.  Pt amb with RW to small sofa in ADL apartment.  Pt observed demonstration of walk-in shower transfer but was not able to return demonstrate secondary to time constraints.  Pt returned to room and amb with RW from door to bed.  Pt performed sit->supine onto left side requiring supervision.  Pt incorporated log roll to complete task onto back.  Pt remained in bed with RN present administering meds. Pt recalled back precautions and adhered to precautions throughout session.Focus on functional amb with RW, furniture transfers, bed transfers, bed mobility, and safety awareness.  Therapy Documentation Precautions:  Precautions Precautions: Back;Fall Precaution Comments: pt only able to report 1/3 precautions, reviewed verbally Required Braces or Orthoses: Spinal Brace Spinal Brace: Lumbar corset;Applied in sitting position Restrictions Weight Bearing Restrictions: No   Pain: Pain Assessment Pain Assessment: 0-10 Pain Score: 5   Pain Type: Acute pain Pain Location: Back Pain Orientation: Lower;Right Pain Descriptors / Indicators: Throbbing Pain Onset: On-going Pain Intervention(s): RN made aware;Repositioned  See FIM for current functional status  Therapy/Group: Individual Therapy  Rich Brave 12/31/2013, 2:18 PM

## 2013-12-31 NOTE — Progress Notes (Signed)
Patient with temp of 101.1 at 2125.  No complaints voiced. Denies cough or congestion.  Dr. Amador Cunas notified with new order to revise Tylenol order to include Fever as well as mild pain as indication for administration.    Kelli Hope M

## 2014-01-01 ENCOUNTER — Encounter (HOSPITAL_COMMUNITY): Payer: Medicare Other | Admitting: Occupational Therapy

## 2014-01-01 ENCOUNTER — Encounter (HOSPITAL_COMMUNITY): Payer: Self-pay

## 2014-01-01 ENCOUNTER — Inpatient Hospital Stay (HOSPITAL_COMMUNITY): Payer: Medicare Other

## 2014-01-01 ENCOUNTER — Telehealth: Payer: Self-pay | Admitting: *Deleted

## 2014-01-01 DIAGNOSIS — M8448XA Pathological fracture, other site, initial encounter for fracture: Secondary | ICD-10-CM

## 2014-01-01 DIAGNOSIS — Z5189 Encounter for other specified aftercare: Secondary | ICD-10-CM

## 2014-01-01 DIAGNOSIS — W240XXA Contact with lifting devices, not elsewhere classified, initial encounter: Secondary | ICD-10-CM

## 2014-01-01 DIAGNOSIS — N19 Unspecified kidney failure: Secondary | ICD-10-CM

## 2014-01-01 DIAGNOSIS — D649 Anemia, unspecified: Secondary | ICD-10-CM

## 2014-01-01 DIAGNOSIS — IMO0002 Reserved for concepts with insufficient information to code with codable children: Secondary | ICD-10-CM

## 2014-01-01 DIAGNOSIS — S32009A Unspecified fracture of unspecified lumbar vertebra, initial encounter for closed fracture: Secondary | ICD-10-CM

## 2014-01-01 DIAGNOSIS — E859 Amyloidosis, unspecified: Secondary | ICD-10-CM

## 2014-01-01 DIAGNOSIS — N189 Chronic kidney disease, unspecified: Secondary | ICD-10-CM

## 2014-01-01 DIAGNOSIS — R339 Retention of urine, unspecified: Secondary | ICD-10-CM

## 2014-01-01 LAB — URINE MICROSCOPIC-ADD ON

## 2014-01-01 LAB — RENAL FUNCTION PANEL
ANION GAP: 12 (ref 5–15)
Albumin: 2.1 g/dL — ABNORMAL LOW (ref 3.5–5.2)
BUN: 75 mg/dL — AB (ref 6–23)
CO2: 22 meq/L (ref 19–32)
Calcium: 9 mg/dL (ref 8.4–10.5)
Chloride: 107 mEq/L (ref 96–112)
Creatinine, Ser: 3.56 mg/dL — ABNORMAL HIGH (ref 0.50–1.35)
GFR calc Af Amer: 18 mL/min — ABNORMAL LOW (ref >90)
GFR calc non Af Amer: 16 mL/min — ABNORMAL LOW (ref >90)
GLUCOSE: 90 mg/dL (ref 70–99)
Phosphorus: 4.2 mg/dL (ref 2.3–4.6)
Potassium: 4.3 mEq/L (ref 3.7–5.3)
SODIUM: 141 meq/L (ref 137–147)

## 2014-01-01 LAB — CBC WITH DIFFERENTIAL/PLATELET
BASOS ABS: 0.1 10*3/uL (ref 0.0–0.1)
Basophils Relative: 1 % (ref 0–1)
EOS PCT: 5 % (ref 0–5)
Eosinophils Absolute: 0.5 10*3/uL (ref 0.0–0.7)
HEMATOCRIT: 31.1 % — AB (ref 39.0–52.0)
HEMOGLOBIN: 10 g/dL — AB (ref 13.0–17.0)
LYMPHS PCT: 14 % (ref 12–46)
Lymphs Abs: 1.5 10*3/uL (ref 0.7–4.0)
MCH: 28.6 pg (ref 26.0–34.0)
MCHC: 32.2 g/dL (ref 30.0–36.0)
MCV: 88.9 fL (ref 78.0–100.0)
MONO ABS: 1.1 10*3/uL — AB (ref 0.1–1.0)
MONOS PCT: 10 % (ref 3–12)
NEUTROS ABS: 7.4 10*3/uL (ref 1.7–7.7)
Neutrophils Relative %: 70 % (ref 43–77)
Platelets: 555 10*3/uL — ABNORMAL HIGH (ref 150–400)
RBC: 3.5 MIL/uL — ABNORMAL LOW (ref 4.22–5.81)
RDW: 15.3 % (ref 11.5–15.5)
WBC: 10.5 10*3/uL (ref 4.0–10.5)

## 2014-01-01 LAB — URINALYSIS, ROUTINE W REFLEX MICROSCOPIC
Bilirubin Urine: NEGATIVE
Glucose, UA: NEGATIVE mg/dL
Ketones, ur: NEGATIVE mg/dL
NITRITE: NEGATIVE
PH: 5 (ref 5.0–8.0)
Protein, ur: NEGATIVE mg/dL
SPECIFIC GRAVITY, URINE: 1.013 (ref 1.005–1.030)
UROBILINOGEN UA: 0.2 mg/dL (ref 0.0–1.0)

## 2014-01-01 LAB — PRO B NATRIURETIC PEPTIDE: Pro B Natriuretic peptide (BNP): 6300 pg/mL — ABNORMAL HIGH (ref 0–125)

## 2014-01-01 MED ORDER — POLYETHYLENE GLYCOL 3350 17 G PO PACK
17.0000 g | PACK | Freq: Every day | ORAL | Status: DC
  Administered 2014-01-03 – 2014-01-06 (×2): 17 g via ORAL
  Filled 2014-01-01 (×6): qty 1

## 2014-01-01 NOTE — Progress Notes (Signed)
Occupational Therapy Session Note  Patient Details  Name: Lance [name] MRN: [id number] Date of Birth: Jun 24, 1940  Today's Date: 01/01/2014 OT Individual Time: 1030-1130 OT Individual Time Calculation (min): 60 min   Short Term Goals: Week 1:  OT Short Term Goal 1 (Week 1): Patient will complete toilet transfer at supervision level OT Short Term Goal 2 (Week 1): Patient will complete LB dressing with min assist with use of AE, prn OT Short Term Goal 3 (Week 1): Patient will complete 1 grooming task in standing with SBA  Skilled Therapeutic Interventions/Progress Updates:  Patient received seated in w/c with 4/10 complaints of pain in RLE; RN aware. Patient stood with RW and ambulated into BR for shower stall transfer onto tub transfer bench. Patient worked on Paediatric nurse, Nurse, adult for LB. Therapist doffed lumbar corset and covered IV site. Patient then completed UB/LB bathing in seated position secondary to lumbar corset wearing orders and used LH sponge secondary to back precautions. After bathing, patient performed UB/LB dressing from bench. Patient donned underwear and pants using reacher, donned shirt & corset, then stood to pull pants > waist. Patient ambulated back to w/c and left seated at sink for grooming tasks with call bell within reach. Patient with increased confusion during session and with 10/10 complaints of pain at end of session. Notified RN of increased pain, confusion and patient's where-about's at end of session.     Precautions:  Precautions Precautions: Back;Fall Precaution Comments: pt only able to report 1/3 precautions, reviewed verbally Required Braces or Orthoses: Spinal Brace Spinal Brace: Lumbar corset;Applied in sitting position Restrictions Weight Bearing Restrictions: No  See FIM for current functional status  Therapy/Group: Individual Therapy  Rasheida Broden 01/01/2014, 12:08 PM

## 2014-01-01 NOTE — Progress Notes (Signed)
Recreational Therapy Assessment and Plan  Patient Details  Name: Lance [name] MRN: [id number] Date of Birth: [birth year] Today's Date: 01/01/2014  Rehab Potential: Good ELOS: 10 days   Assessment Clinical Impression: Problem List:  Patient Active Problem List    Diagnosis  Date Noted   .  Myelopathy of lumbar region  12/27/2013   .  Malnutrition of moderate degree  12/25/2013   .  Closed lumbar vertebral fracture  12/22/2013   .  Spinal stenosis of lumbar region  12/22/2013   .  Lumbar vertebral fracture  12/22/2013   .  Degenerative arthritis of hip  05/08/2011    Past Medical History:  Past Medical History   Diagnosis  Date   .  GERD (gastroesophageal reflux disease)    .  Arthritis      osteoarthritis   .  Hypothyroidism    .  Chronic kidney disease      bph,kidney bx,renal insuff.   .  Anxiety    .  Weight decrease      20lbs    Past Surgical History:  Past Surgical History   Procedure  Laterality  Date   .  Rotator cuff repair   2004     right rotator cuff repair   .  Total hip arthroplasty   05/08/2011     Procedure: TOTAL HIP ARTHROPLASTY ANTERIOR APPROACH; Surgeon: Mcarthur Rossetti; Location: [facility]; Service: Orthopedics; Laterality: Right; Right Total Hip Arthroplasty, Direct Anterior Approach (C-Arm)   .  Hand surgery     .  Renal biopsy     .  Laminectomy       L2 L3 WITH OPEN KYPHOPLASTY   .  Lumbar laminectomy/decompression microdiscectomy  Right  12/22/2013     Procedure: Right L/2-3LUMBAR DECOMPRESSION 1 LEVEL ; Surgeon: Charlie Pitter, MD; Location: [facility]; Service: Neurosurgery; Laterality: Right; Right L/2-3LUMBAR DECOMPRESSION 1 LEVEL    Assessment & Plan  Clinical Impression: Lance [name] is a 73 y.o. male with history of CKD, anxiety disorder, recent 20 lbs weight loss with 3 month h/o progressive weakness and malaise, Lifting injury 2 months ago with subsequent L3 compression Fx with stenosis and L3 radiculopathy. He has failed  conservative treatment and elected to undergo L2/3 decompressive laminectomy with foraminotomy and open kyphoplasty vertebroplasty by Dr. Annette Stable on 12/22/13. Bone biopsy negative for tumor and patient with recent dx of renal amyloidosis and will need follow up with Heme/Onc as well as nephrology (MD in [place]). Post op has had issues with urinary retention, acute on chronic renal failure with BUN/Cr rising to 69/2.84. He developed confusion with hallucinations and tremors this weekend with initiation of steroids. Decadron d/c 08/24 and mentation clearing. Foley discontinued. Patient transferred to [facility] on 12/27/2013.   Pt presents with decreased activity tolerance, decreased functional mobility, decreased balance limiting pt's independence with leisure/community pursuits.    Leisure History/Participation Premorbid leisure interest/current participation: Petra [place] - Flower gardening;Nature - Vegetable gardening;Community - Shopping mall;Community - Grocery store;Community - Travel (Comment) Other Leisure Interests: Cooking/Baking Leisure Participation Style: With Family/Friends;Alone Awareness of Community Resources: Good-identify 3 post discharge leisure resources Psychosocial / Spiritual Does patient have pets?: No Social interaction - Mood/Behavior: Cooperative Engineer, drilling for Education?: Yes Recreational Therapy Orientation Orientation -Reviewed with patient: Available activity resources Strengths/Weaknesses Patient Strengths/Abilities: Willingness to participate;Active premorbidly Patient weaknesses: Physical limitations TR Patient demonstrates impairments in the following area(s): Endurance;Motor;Pain;Safety TR Additional Impairment(s): None  Plan Rec Therapy Plan Is patient appropriate for  Therapeutic Recreation?: Yes Rehab Potential: Good Treatment times per week: Min 1 time per week >20 minutes Estimated Length of Stay: 10 days TR Treatment/Interventions: Adaptive  equipment instruction;1:1 session;Balance/vestibular training;Functional mobility training;Community reintegration;Patient/family education;Therapeutic activities;Recreation/leisure participation;Therapeutic exercise;UE/LE Coordination activities  Recommendations for other services: None  Discharge Criteria: Patient will be discharged from TR if patient refuses treatment 3 consecutive times without medical reason.  If treatment goals not met, if there is a change in medical status, if patient makes no progress towards goals or if patient is discharged from hospital.  The above assessment, treatment plan, treatment alternatives and goals were discussed and mutually agreed upon: by patient  [place] 01/01/2014, 4:14 PM

## 2014-01-01 NOTE — Progress Notes (Signed)
[facility] PHYSICAL MEDICINE & REHABILITATION     PROGRESS NOTE    Subjective/Complaints: Having pain in right leg still when up. Pain controlled at rest. Quiet night for the most part. Partner VERY anxious about pt's renal progrnosis  Objective: Vital Signs: Blood pressure 106/66, pulse 89, temperature 98.2 F (36.8 C), temperature source Oral, resp. rate 17, SpO2 98.00%. No results found. No results found for this basename: WBC, HGB, HCT, PLT,  in the last 72 hours  Recent Labs  12/31/13 0625 01/01/14 0550  NA 138 141  K 4.1 4.3  CL 102 107  GLUCOSE 91 90  BUN 92* 75*  CREATININE 3.86* 3.56*  CALCIUM 9.0 9.0   CBG (last 3)  No results found for this basename: GLUCAP,  in the last 72 hours  Wt Readings from Last 3 Encounters:  12/22/13 62.5 kg (137 lb 12.6 oz)  12/22/13 62.5 kg (137 lb 12.6 oz)  12/21/13 62.869 kg (138 lb 9.6 oz)    Physical Exam:  Constitutional: He is oriented to person, place, and time. He appears well-developed and well-nourished.  HENT: oral mucosa pink/moist  Head: Normocephalic and atraumatic.  Eyes: Conjunctivae are normal. Pupils are equal, round, and reactive to light.  Neck: Normal range of motion. Neck supple.  Cardiovascular: Normal rate and regular rhythm.  Respiratory: Effort normal and breath sounds normal. No respiratory distress. He has no wheezes.  GI: He exhibits distension. Bowel sounds are increased. There is no tenderness.  Neurological: He is alert and oriented to person, place, and time.  Speech dysarthric. Follows commands without difficulty. UES: RUE: deltoid 5/5, bicep 5/5, tricep 5/5, wrist ext 5/5, hand intrinsics 5/5 LUE: deltoid 5/5, bicep 5/5, triceps 5/5, wrist ext 5/5, hand instrincs 5/5 RLE: Hip 2/5 pain, Isolated KE 2/5. HAD 3+/5. Ankle 4+/5 LLE: Hip 3+, KE 4/5, ankle 4/5.  Sensory changes over the anterior-lateral thigh to right knee. DTR's 1+  Skin: Skin is warm and dry.surgical site intact  M/S: back tender  to touch and with proximal LE movement  Psychiatric: He has a normal mood and affect. His behavior is normal. Judgment and thought content normal Urology: blood/clots from penis  Assessment/Plan: 1. Functional deficits secondary to L3 fx and associated radiculopathy which require 3+ hours per day of interdisciplinary therapy in a comprehensive inpatient rehab setting. Physiatrist is providing close team supervision and 24 hour management of active medical problems listed below. Physiatrist and rehab team continue to assess barriers to discharge/monitor patient progress toward functional and medical goals. FIM: FIM - Bathing Bathing Steps Patient Completed: Chest;Right Arm;Left Arm;Abdomen;Front perineal area;Buttocks;Right upper leg;Left upper leg Bathing: 4: Min-Patient completes 8-9 30f 10 parts or 75+ percent  FIM - Upper Body Dressing/Undressing Upper body dressing/undressing steps patient completed: Thread/unthread right sleeve of pullover shirt/dresss;Thread/unthread left sleeve of pullover shirt/dress;Put head through opening of pull over shirt/dress;Pull shirt over trunk Upper body dressing/undressing: 5: Supervision: Safety issues/verbal cues FIM - Lower Body Dressing/Undressing Lower body dressing/undressing: 1: Total-Patient completed less than 25% of tasks  FIM - Toileting Toileting: 1: Total-Patient completed zero steps, helper did all 3     FIM - Banker Devices: Walker;Arm rests Bed/Chair Transfer: 4: Bed > Chair or W/C: Min A (steadying Pt. > 75%);4: Supine > Sit: Min A (steadying Pt. > 75%/lift 1 leg)  FIM - Locomotion: Wheelchair Distance: 150 Locomotion: Wheelchair: 5: Travels 150 ft or more: maneuvers on rugs and over door sills with supervision, cueing or coaxing FIM -  Locomotion: Ambulation Locomotion: Ambulation Assistive Devices: Walker - Rolling;Orthosis (LSO) Ambulation/Gait Assistance: 4: Min assist Locomotion:  Ambulation: 2: Travels 50 - 149 ft with minimal assistance (Pt.>75%)  Comprehension Comprehension Mode: Auditory Comprehension: 5-Follows basic conversation/direction: With no assist  Expression Expression Mode: Verbal Expression: 4-Expresses basic 75 - 89% of the time/requires cueing 10 - 24% of the time. Needs helper to occlude trach/needs to repeat words.  Social Interaction Social Interaction: 4-Interacts appropriately 75 - 89% of the time - Needs redirection for appropriate language or to initiate interaction.  Problem Solving Problem Solving: 3-Solves basic 50 - 74% of the time/requires cueing 25 - 49% of the time  Memory Memory: 4-Recognizes or recalls 75 - 89% of the time/requires cueing 10 - 24% of the time  Medical Problem List and Plan:  1. Functional deficits secondary to Rigth L3 radiculopathy , L3 compression fx s/p decompression/kyphoplasty  2. DVT Prophylaxis/Anticoagulation: Pharmaceutical: Lovenox  3. Pain Management: limited with Gabapentin to renal issues. Limiting narcotics d/t lethargy (as well as confusion per partner)  4. Anxiety disorder/Mood: Delirium appears to be resolving--sleepy this am and reports of hallucinations last night. Team to provide ego support. LCSW to follow for evaluation and support.  5. Neuropsych: This patient is capable of making decisions on her own behalf.  6. Skin/Wound Care: Pressure relief measures.   7. Acute on chronic renal failure: Chronic Hyponatremia with Na-128, BUN/Cr- 39/2.42 at admission. Need to push po fluids.  -continue IVF per renal recs  -keep watch of K+ as well  -scan for PVR BID, condom cath 8. Constipation: Will need aggressive bowel program. Change colace to Miralax. SSE with results.  9. H/o BPH: Increased Flomax to 0.8 mg.   -use coude cath for I/C's, continue prn until PVR's decrease  -Sees [facility] as outpt  -ua neg, culture neg 10. Renal amyloidosis: spouse VERY concerned over prognosis and need for  treatment of this while here.  -appreciate nephrology input. (Dr. Allena Katz. Sees Dr. Leretha Dykes in  [place]).  -will request H/O consult to help determine treatment course/plan.  12. Odynophagia: continue diflucan  -biotene for oral hygiene and moisture  LOS (Days) 5 A FACE TO FACE EVALUATION WAS PERFORMED  SWARTZ,ZACHARY T 01/01/2014 8:03 AM

## 2014-01-01 NOTE — Telephone Encounter (Signed)
Received fax from [facility] with pathology report of kidney biopsy.

## 2014-01-01 NOTE — Telephone Encounter (Signed)
Returned call from [place] at [facility] in reference to pathology report from kidney biopsy. She stated that patient's wife said that it would need to be sent to this office. Leanna to fax report.

## 2014-01-01 NOTE — Plan of Care (Signed)
Problem: SCI BLADDER ELIMINATION Goal: RH STG MANAGE BLADDER WITH ASSISTANCE STG Manage Bladder With mod Assistance  Outcome: Not Progressing Coude cath in place.

## 2014-01-01 NOTE — Progress Notes (Signed)
Physical Therapy Session Note  Patient Details  Name: Lance [name] MRN: [id number] Date of Birth: Nov 01, 1940  Today's Date: 01/01/2014 PT Individual Time: 0830-0900 PT Individual Time Calculation (min): 30 min   Short Term Goals: Week 1:  PT Short Term Goal 1 (Week 1): patient will perform bed mobility with rails and supervision PT Short Term Goal 2 (Week 1): patient will perform sit to stand and stand pivot transfer bed <> wheelchair with supervsion PT Short Term Goal 3 (Week 1): Patient will propel wheelchair 100 feet with supervision in comtrolled environment PT Short Term Goal 4 (Week 1): Patient will ambuate 75 feet with rolling walker and min steady assist in controlled environment  Skilled Therapeutic Interventions/Progress Updates:  1:1. Pt received semi-reclined in bed, ready for therapy. Focus this session on bed mobility, donning/doffing LSO and functional ambulation. Pt req mod cueing for log roll technique during t/f L sidelying<>sit EOB 2x. 1x w/ use of bed rail w/ supervision, 2x w/out bed rail and min A. Pt practiced donning/doffing LSO 2x, req min A overall w/ mod cueing for seq. Pt w/ fair tolerance to ambulation 50'x1 w/ RW and min A, distance limited by pain. Pt demonstrates overall flexed posture, correcting w/ verbal cues but unable to maintain. Pt stating that he received pain meds prior to start of session. Pt left sitting in w/c w/ all needs in reach, brushing teeth at sink w/ significant other in room.   Therapy Documentation Precautions:  Precautions Precautions: Back;Fall Precaution Comments: pt only able to report 1/3 precautions, reviewed verbally Required Braces or Orthoses: Spinal Brace Spinal Brace: Lumbar corset;Applied in sitting position Restrictions Weight Bearing Restrictions: No   Pain: Pain Assessment Pain Assessment: 0-10 Pain Score: 0-No pain Pain Type: Acute pain Pain Location: Leg Pain Orientation: Left Pain Onset:  On-going Patients Stated Pain Goal: 0 Pain Intervention(s): Medication (See eMAR) Multiple Pain Sites: No  See FIM for current functional status  Therapy/Group: Individual Therapy  Denzil Hughes 01/01/2014, 11:58 AM

## 2014-01-01 NOTE — Progress Notes (Signed)
Assessment/ Plan:   1. Acute renal failure on chronic kidney disease stage IV, slow improvementy: Appears to be primarily hemodynamic from intermittent episodes of hypotension postoperatively and with a minor role from his bladder outlet obstruction.  2. Hyperkalemia: This appears to be chronic 4. Status post lumbar surgery: Further management per inpatient rehabilitation team  5. 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 The patient's partner who is also his medical power of attorney Elam Dutch [phone]) states that Dr.Zekan has been kept up-to-date with the patient's current hospitalization and recommended BM.  Subjective: Interval History: Awaiting results from Dr. Leretha Dykes for Dr. Ala Bent.  Partner [name] is upset at difficulty getting records.  Objective: Vital signs in last 24 hours: Temp:  [98.2 F (36.8 C)-101.1 F (38.4 C)] 98.2 F (36.8 C) (08/31 0625) Pulse Rate:  [89-101] 89 (08/31 0625) Resp:  [17] 17 (08/31 0625) BP: (106-128)/(65-74) 106/66 mmHg (08/31 0625) SpO2:  [93 %-99 %] 98 % (08/31 0625) Weight:  [62.5 kg (137 lb 12.6 oz)] 62.5 kg (137 lb 12.6 oz) (08/31 1100) Weight change:   Intake/Output from previous day: 08/30 0701 - 08/31 0700 In: 2827.5 [P.O.:840; I.V.:1987.5] Out: 3500 [Urine:3500] Intake/Output this shift: Total I/O In: 240 [P.O.:240] Out: -   General appearance: alert and cooperative, very weak appearing Resp: clear to auscultation bilaterally Chest wall: no tenderness Cardio: regular rate and rhythm, S1, S2 normal, no murmur, click, rub or gallop Extremities: extremities normal, atraumatic, no cyanosis or edema  Lab Results:  Recent Labs  01/01/14 1150  WBC 10.5  HGB 10.0*  HCT 31.1*  PLT 555*   BMET:  Recent Labs  12/31/13 0625 01/01/14 0550  NA 138 141  K 4.1 4.3  CL 102 107  CO2 20 22  GLUCOSE 91 90  BUN 92* 75*  CREATININE 3.86* 3.56*  CALCIUM  9.0 9.0   No results found for this basename: PTH,  in the last 72 hours Iron Studies: No results found for this basename: IRON, TIBC, TRANSFERRIN, FERRITIN,  in the last 72 hours Studies/Results: No results found.  Scheduled: . antiseptic oral rinse  15 mL Mouth Rinse BID  . enoxaparin (LOVENOX) injection  30 mg Subcutaneous Q24H  . famotidine  10 mg Oral Daily  . feeding supplement (NEPRO CARB STEADY)  237 mL Oral BID BM  . fluconazole  100 mg Oral Daily  . gabapentin  200 mg Oral TID  . levothyroxine  50 mcg Oral QAC breakfast  . multivitamin  1 tablet Oral QHS  . pantoprazole  40 mg Oral BID  . [START ON 01/02/2014] polyethylene glycol  17 g Oral Daily  . simvastatin  10 mg Oral q1800  . tamsulosin  0.4 mg Oral BID     LOS: 5 days   Kaedan Richert C 01/01/2014,1:15 PM

## 2014-01-01 NOTE — Progress Notes (Signed)
Physical Therapy Session Note  Patient Details  Name: Lance [name] MRN: [id number] Date of Birth: 03-28-1941  Today's Date: 01/01/2014 PT Individual Time: 1400-1500 PT Individual Time Calculation (min): 60 min   Short Term Goals: Week 1:  PT Short Term Goal 1 (Week 1): patient will perform bed mobility with rails and supervision PT Short Term Goal 2 (Week 1): patient will perform sit to stand and stand pivot transfer bed <> wheelchair with supervsion PT Short Term Goal 3 (Week 1): Patient will propel wheelchair 100 feet with supervision in comtrolled environment PT Short Term Goal 4 (Week 1): Patient will ambuate 75 feet with rolling walker and min steady assist in controlled environment  Skilled Therapeutic Interventions/Progress Updates:    Session focused on functional mobility training including bed mobility while maintaining back precautions with cues, functional transfers with RW, dynamic gait through cones to simulate household environment gait with steady A (cues for positioning of RW during turns), heel/toe raises in standing to work on strengthening to aid with balance reactions, w/c propulsion for general endurance and strengthening, and gait trial with rollator as pt uses this at home (plan for significant other to bring this in). Pt also plans to have significant other measure height of bed in order to practice for future sessions.   Therapy Documentation Precautions:  Precautions Precautions: Back;Fall Precaution Comments: pt only able to report 1/3 precautions, reviewed verbally Required Braces or Orthoses: Spinal Brace Spinal Brace: Lumbar corset;Applied in sitting position Restrictions Weight Bearing Restrictions: No  Pain: Premedicated by RN for back pain.  Locomotion : Ambulation Ambulation/Gait Assistance: 4: Min assist   See FIM for current functional status  Therapy/Group: Individual Therapy  Karolee Stamps [facility] 01/01/2014, 3:53 PM

## 2014-01-01 NOTE — Progress Notes (Signed)
Physical Therapy Session Note  Patient Details  Name: Lance [name] MRN: [id number] Date of Birth: 1940-06-04  Today's Date: 01/01/2014 PT Individual Time: 1600-1630 PT Individual Time Calculation (min): 30 min   Short Term Goals: Week 1:  PT Short Term Goal 1 (Week 1): patient will perform bed mobility with rails and supervision PT Short Term Goal 2 (Week 1): patient will perform sit to stand and stand pivot transfer bed <> wheelchair with supervsion PT Short Term Goal 3 (Week 1): Patient will propel wheelchair 100 feet with supervision in comtrolled environment PT Short Term Goal 4 (Week 1): Patient will ambuate 75 feet with rolling walker and min steady assist in controlled environment  Skilled Therapeutic Interventions/Progress Updates:    Pt received supine in bed, agreeable to participate in therapy. Session focused on bed mobility and functional endurance. Pt able to name 2/3 back precautions, named 3/3 w/ mod cueing. Pt rolled L/R multiple times throughout session on flat bed w/ rails down. Pt requires min cueing for setup of roll then MinA for rolling to maintain precautions. Supine <> sit on both sides of bed, requires ModA to manage BLE into bed while moving sit>supine. Pt donned/doffed TLSO at bedside w/ min cueing for maintaining precautions (pt tended to twist to reach brace). Pt performed scooting at EOB w/ MinGuard A, demonstrated good clearance and maintained precautions without cueing. Pt left supine in bed w/ all needs within reach w/ partner present.  Therapy Documentation Precautions:  Precautions Precautions: Back;Fall Precaution Comments: pt only able to report 1/3 precautions, reviewed verbally Required Braces or Orthoses: Spinal Brace Spinal Brace: Lumbar corset;Applied in sitting position Restrictions Weight Bearing Restrictions: No Pain: Pain Assessment Pain Score: 0-No pain Locomotion : Ambulation Ambulation/Gait Assistance: 4: Min assist   See  FIM for current functional status  Therapy/Group: Individual Therapy  Hosie Spangle Hosie Spangle, PT, DPT 01/01/2014, 1:50 PM

## 2014-01-01 NOTE — Consult Note (Signed)
Lance [name]  [phone]   HEMATOLOGY ONCOLOGY CONSULTATION   Lance [name]  DOB: 07-Jan-1941  MR#: [id number]  CSN#: [id number]    Requesting Physician: [facility]  Primary MD: Dr. Melford Aase Endo: Dr. Buddy Duty Nephro: Dr. Joesph July in [facility] Urology: DrRisa Grill Neurosurgery: Dr. 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 He was transferred to the inpatient  rehab unit on 8/26. In addition, patient has apparently  has been recently diagnosed with renal amyloidosis. Unfortunately, the formal records/path report is not here for review. Per patient report, this is recent, as he was noted to have advanced CKIII to CK4, suggesting a glomerular process, for which Renal biopsy was performed by Dr. Joesph July in [facility]. Records have been requested ASAP. Based on this possible diagnosis, we were asked to see Mr. Lance [name] with recommendations His back pain is better controlled with meds and post surgery. Denies decreased urine output, uses catheter. Denies chest pain. No shortness of breath. Occasional constipation. Denies easy bruising. No confusion. Available data is as follows: HIV non reactive. Hepatitis panel negative. ANA/ ANCA negative. UPEP negative . No IFE available. Negative GBM. Protein in urine (30)  Past medical history:      Past Medical History  Diagnosis Date  . GERD (gastroesophageal reflux disease)   . Arthritis     osteoarthritis  . Hypothyroidism   . Chronic kidney disease     bph,kidney bx,renal insuff.  . Anxiety   . Weight decrease     20lbs    Hyperlipidemia BPH  Past surgical history:      Past Surgical History  Procedure Laterality Date  . Rotator cuff repair  2004    right rotator cuff repair  . 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 Hand surgery    . Renal biopsy    . Laminectomy      L2 L3  WITH OPEN KYPHOPLASTY  . Lumbar laminectomy/decompression microdiscectomy Right 12/22/2013    Procedure: Right L/2-3LUMBAR DECOMPRESSION 1 LEVEL ;  Surgeon: Charlie Pitter, MD;  Location: [place] [facility];  Service: Neurosurgery;  Laterality: Right;  Right L/2-3LUMBAR DECOMPRESSION 1 LEVEL    Medications:  Prior to Admission:  Prescriptions prior to admission  Medication Sig Dispense Refill  . docusate sodium (COLACE) 100 MG capsule Take 100 mg by mouth daily.      . famotidine (PEPCID AC) 10 MG chewable tablet Chew 10 mg by mouth daily.      Marland Kitchen gabapentin (NEURONTIN) 300 MG capsule Take 300 mg by mouth 3 (three) times daily.      Marland Kitchen HYDROcodone-acetaminophen (NORCO/VICODIN) 5-325 MG per tablet Take 1 tablet by mouth every 6 (six) hours as needed for moderate pain (every 8 hrs).      Marland Kitchen levothyroxine (SYNTHROID) 50 MCG tablet Take 50 mcg by mouth daily.      . Menthol, Topical Analgesic, (BIOFREEZE EX) Apply 1 application topically 3 (three) times daily as needed (  for pain).      . pravastatin (PRAVACHOL) 20 MG tablet Take 20 mg by mouth at bedtime.       . tamsulosin (FLOMAX) 0.4 MG CAPS capsule Take 0.4 mg by mouth daily.        DGL:OVFIEPPIRJJOA, alum & mag hydroxide-simeth, bisacodyl, diphenhydrAMINE, guaiFENesin-dextromethorphan, menthol-cetylpyridinium, methocarbamol, phenol, traMADol, traZODone  Allergies: No Known Allergies  Family history:  Mother died at 51 with Gyn Cancer. Father died at 71 with bladder cancer No  siblings Multiple family members with heart disease  Social History.  Married to Lance [name], his [place] , been together for 46 years. No children. Lives in [place]. Minimal tobacco use. Drinks one glass of wine daily. Tried marijuana to help with pain and increase appetite.  ROS: Constitutional: Denies fevers, chills or abnormal night sweats. decreased energy Eyes: Denies blurriness of vision, double vision or watery eyes Ears, nose, mouth, throat, and face: Denies mucositis or sore throat Respiratory: Denies cough, dyspnea or wheezes Cardiovascular: Denies palpitation, chest discomfort or lower extremity swelling Gastrointestinal:  Denies nausea or vomiting, heartburn or change in bowel habits. poor appetite. 20 lb weight loss in the last 2 months Skin: Denies abnormal skin rashes Lymphatics: Denies new lymphadenopathy or easy bruising Neurological: Denies numbness, tingling or new weaknesses. Back pain, severe, as in HPI Behavioral/Psych: Mood is stable, no new changes  All other systems were reviewed with the patient and are negative.    Physical Exam    ECOG PERFORMANCE STATUS: 4 Bedbound (Completely disabled. Cannot carry on any self-care. Totally confined to bed or chair)  Filed Vitals:   01/01/14 0625  BP: 106/66  Pulse: 89  Temp: 98.2 F (36.8 C)  Resp: 17   There were no vitals filed for this visit.  GENERAL:alert, no distress and comfortable. very thin. SKIN: skin color, texture, turgor are normal, no rashes or significant lesions. EYES: normal, conjunctiva are pink and non-injected, sclera clear. No periorbital purpura or echymoses OROPHARYNX:no exudate, no erythema and lips, buccal mucosa, and tongue normal No macroglossia. minimal angular cheilosis. NECK: supple, thyroid normal size, non-tender, without nodularity LYMPH:  no palpable lymphadenopathy in the cervical, axillary or inguinal LUNGS: clear to auscultation and percussion with normal breathing effort HEART:  regular rate & rhythm and no murmurs and no lower extremity edema ABDOMEN:abdomen soft, non-tender and normal bowel sounds Musculoskeletal:no cyanosis of digits and no clubbing, but nails brittle.   PSYCH: alert & oriented x 3 with fluent speech NEURO:patient sitting in wheelchair, back brace on. gait not tested.    Lab results:       CBC  Recent Labs Lab 12/28/13 0630  WBC 13.0*  HGB 10.7*  HCT 32.6*  PLT 483*  MCV 87.4  MCH 28.7  MCHC 32.8  RDW 15.1  LYMPHSABS 1.8  MONOABS 1.0  EOSABS 0.5  BASOSABS 0.0    Anemia panel:  No results found for this basename: VITAMINB12, FOLATE, FERRITIN, TIBC, IRON, RETICCTPCT,  in the last 72 hours   Chemistries   Recent Labs Lab 12/28/13 0630 12/29/13 0616 12/30/13 1152 12/31/13 0625 01/01/14 0550  NA 136* 133* 134* 138 141  K 5.4* 5.3 4.0 4.1 4.3  CL 99 97 98 102 107  CO2 20 19 20 20 22   GLUCOSE 86 91 110* 91 90  BUN 88* 98* 98* 92* 75*  CREATININE 2.86* 3.33* 3.96* 3.86* 3.56*  CALCIUM 10.4 9.7 8.6 9.0 9.0      Studies:      Mr Lumbar Spine Wo  Contrast  12/07/2013   CLINICAL DATA:  Low back pain. RIGHT-greater-than- LEFT bilateral thigh pain. Lifting injury.  EXAM: MRI LUMBAR SPINE WITHOUT CONTRAST  TECHNIQUE: Multiplanar, multisequence MR imaging of the lumbar spine was performed. No intravenous contrast was administered.  COMPARISON:  [place] survey 11/21/2013.  FINDINGS: Segmentation: The numbering convention used for this exam termed L5-S1 as the last intervertebral disc space. Five lumbar type vertebral bodies identified on prior radiographs.  Alignment: Levoconvex curve is present with the apex at L3. 2 mm retrolisthesis of L2 on L3 and L3 on L4.  Vertebrae: Acute/ subacute L3 compression fracture with marrow edema along the inferior endplate. Impaction of the superior endplate. Mild retropulsion. Paravertebral phlegmon is present. Other vertebral bodies show preserved height. Marrow signal is otherwise within normal  limits. 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 Significantly, no mass lesions are identified in the other vertebrae and sacrum.  Conus medullaris: Normal termination at L2.  Paraspinal tissues: RIGHT renal sinus cysts.  Disc levels:  T12-L1: Sagittal imaging only.  Negative.  L1-L2: Mild disc degeneration with shallow broad-based bulging. No resulting stenosis.  L2-L3: RIGHT eccentric broad-based disc bulging. Foramina appear adequately patent. Mild central stenosis.  At L3, there is mild central stenosis associated with retropulsion of the vertebral body but no neural compression.  L3-L4: Multifactorial moderate RIGHT and mild LEFT foraminal stenosis. Some of this is associated with collapse of the vertebra. There is superimposed disc bulging and disc degeneration. At the level of the disc space, central stenosis is minimal. The lateral recesses are patent.  L4-L5: Disc degeneration is present with loss of disc height and desiccation. There central annular tearing. Despite the degeneration, the central canal is patent. Both neural foramina are patent.  L5-S1: Disc degeneration with vacuum disc.  No stenosis.  IMPRESSION: 1. Acute or subacute L3 compression fracture. Maximal loss of vertebral body height is 50%. Mild retropulsion. 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 2. No other bone lesions are identified to suggest metastatic disease. 3. L3-L4 multifactorial RIGHT greater than LEFT foraminal stenosis potentially affecting the L3 nerves. More mild degenerative disease at other levels, detailed above.   Electronically Signed   By: Dereck Ligas M.D.   On: 12/07/2013 09:40   US Renal  12/29/2013   CLINICAL DATA:  Acute renal failure  EXAM: RENAL/URINARY TRACT ULTRASOUND COMPLETE  COMPARISON:  12/16/2013  FINDINGS: Right Kidney:  Length: 10.5 cm. A small parapelvic cyst is noted similar to that seen on prior CTs.  Left Kidney:  Length: 1.6 cm. Changes from the previous biopsy  are no longer identified.  Bladder:  Partially decompressed by Foley catheter.  IMPRESSION: No acute abnormality is noted.  Stable right parapelvic cyst.   Electronically Signed   By: Inez Catalina M.D.   On: 12/29/2013 19:10   Dg C-arm 1-60 Min-no Report  12/22/2013   CLINICAL DATA: laminectomy   C-ARM 1-60 MINUTES  Fluoroscopy was utilized by the requesting physician.  No radiographic  interpretation.   Patient: AZAREL, BANNER Collected: 12/22/2013 Client: [place] Accession: [id number] Received: 12/22/2013 Earnie Larsson Diagnosis Bone, fragment(s), Open vertebral biopsy of lumbar three - FRAGMENTS OF FOCALLY DEVITALIZED BENIGN REACTIVE BONE WITH DEGENERATIVE MARROW HYALINIZATION. Mali RUND DO Pathologist, Electronic Signature (Case signed 12/25/2013) Specimen Gross and Clinical Information  Assessmnent/Plan:73 y.o. male   1. Amyloidosis We need records from renal biopsy performed by Dr. Joesph July in [facility] as soon as possible for evaluation and treatment  options as they are many types and subtypes. Records need to be faxed. Patient to signe release. Pending these results further recommendations to proceed. Consider check SPEP with immunofixation, 2 D echo. Will entertain possibility of bone marrow biopsy pending on the renal biopsy report. Will follow with you  2. 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 No transfusion indicated at this time.   3.right L3 radiculopathy, with evidence of L3 compression fracture  requiring decompression/kyphoplasty by Neurosurgery on 12/22/13. Pathology of his L3 fractured vertebra is negative for tumor. He was transferred to the inpatient  rehab unit on 8/26.  4.Renal Failure As per Nephrology  5. Full Code  Elease Hashimoto 01/01/2014  ADDENDUM: Hematology/Oncology Attending: The patient is seen and examined today. I agree with the above note. This is a very pleasant 73 years old white  male with past medical history significant for GERD, hypothyroidism, chronic kidney disease as well as significant weight loss of around 20 pounds and hyperlipidemia. He is currently at the rehabilitation facility at [facility] after undergoing L2/3 decompressive laminectomy with foraminotomy and open kyphoplasty vertebroplasty by Dr. Annette Stable on 12/22/13. He is currently undergoing physical therapy and rehabilitation. The patient was noted to have deterioration of his renal function. He was seen by his nephrologist Dr. Joesph July in [facility] and a renal biopsy was performed. The final pathology from what first 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 I was consulted to see the patient today and give recommendation regarding these findings. The patient is feeling fine except for the back pain and he is currently undergoing physical therapy. He denied having any significant chest pain or shortness of breath. He denied having any swelling in his lower extremities. The patient denied having any bleeding issues. He denied having any peripheral neuropathy. He has a history of weight loss as well as hyperlipidemia and hypothyroidism which are all consistent with Amyloidosis.  On physical exam his tongue is enlarged. No evidence for JVD. I had a lengthy discussion with the patient today about his current condition. I will order several studies for evaluation of his condition including, EKG, 2-D echo, I may consider him for cardiac MRI if there is any concerning findings on the echo. I will also order myeloma panel including serum light chain as well as 24 hour urine collection for protein electrophoreses. The patient would also need a bone marrow biopsy and aspirate and this can be performed by interventional radiology during his hospitalization. I briefly discussed with the patient his treatment options but this will be in the form of systemic  chemotherapy with Velcade subcutaneously or intravenously in addition to dexamethasone plus/minus Cytoxan as first line option. Other options include treatment with Carfilzomib, Cytoxan and dexamethasone. There is no urgent need to start the patient on treatment during his hospitalization but I will arrange a followup appointment for him at the [facility] after discharge from the rehabilitation facility for more detailed discussion of his treatment options. The patient agreed to the current plan. Thank you so much for allowing me to participate in the care of Mr. Marzette, I will continue to follow up the patient with you and assist in his management an as-needed basis.

## 2014-01-02 ENCOUNTER — Encounter (HOSPITAL_COMMUNITY): Payer: Medicare Other | Admitting: Occupational Therapy

## 2014-01-02 ENCOUNTER — Inpatient Hospital Stay (HOSPITAL_COMMUNITY): Payer: Medicare Other

## 2014-01-02 ENCOUNTER — Inpatient Hospital Stay (HOSPITAL_COMMUNITY): Payer: Medicare Other | Admitting: *Deleted

## 2014-01-02 ENCOUNTER — Inpatient Hospital Stay (HOSPITAL_COMMUNITY): Payer: Medicare Other | Admitting: Physical Therapy

## 2014-01-02 DIAGNOSIS — R339 Retention of urine, unspecified: Secondary | ICD-10-CM

## 2014-01-02 DIAGNOSIS — W240XXA Contact with lifting devices, not elsewhere classified, initial encounter: Secondary | ICD-10-CM

## 2014-01-02 DIAGNOSIS — N189 Chronic kidney disease, unspecified: Secondary | ICD-10-CM

## 2014-01-02 DIAGNOSIS — S32009A Unspecified fracture of unspecified lumbar vertebra, initial encounter for closed fracture: Secondary | ICD-10-CM

## 2014-01-02 DIAGNOSIS — I369 Nonrheumatic tricuspid valve disorder, unspecified: Secondary | ICD-10-CM

## 2014-01-02 DIAGNOSIS — IMO0002 Reserved for concepts with insufficient information to code with codable children: Secondary | ICD-10-CM

## 2014-01-02 DIAGNOSIS — Z5189 Encounter for other specified aftercare: Secondary | ICD-10-CM

## 2014-01-02 LAB — RENAL FUNCTION PANEL
ALBUMIN: 2.1 g/dL — AB (ref 3.5–5.2)
ANION GAP: 12 (ref 5–15)
BUN: 66 mg/dL — AB (ref 6–23)
CHLORIDE: 105 meq/L (ref 96–112)
CO2: 21 mEq/L (ref 19–32)
Calcium: 8.8 mg/dL (ref 8.4–10.5)
Creatinine, Ser: 3.31 mg/dL — ABNORMAL HIGH (ref 0.50–1.35)
GFR, EST AFRICAN AMERICAN: 20 mL/min — AB (ref >90)
GFR, EST NON AFRICAN AMERICAN: 17 mL/min — AB (ref >90)
Glucose, Bld: 87 mg/dL (ref 70–99)
POTASSIUM: 4.4 meq/L (ref 3.7–5.3)
Phosphorus: 3.4 mg/dL (ref 2.3–4.6)
SODIUM: 138 meq/L (ref 137–147)

## 2014-01-02 LAB — URINE CULTURE
COLONY COUNT: NO GROWTH
Culture: NO GROWTH

## 2014-01-02 LAB — APTT: aPTT: 41 seconds — ABNORMAL HIGH (ref 24–37)

## 2014-01-02 LAB — PROTIME-INR
INR: 1.24 (ref 0.00–1.49)
Prothrombin Time: 15.6 seconds — ABNORMAL HIGH (ref 11.6–15.2)

## 2014-01-02 MED ORDER — MAGIC MOUTHWASH
5.0000 mL | Freq: Four times a day (QID) | ORAL | Status: DC
  Administered 2014-01-02 – 2014-01-06 (×14): 5 mL via ORAL
  Filled 2014-01-02 (×19): qty 5

## 2014-01-02 NOTE — Progress Notes (Signed)
1. Acute renal failure on chronic kidney disease stage IV, slow improvement continues: Appears to be primarily hemodynamic from intermittent episodes of hypotension postoperatively and with a minor role from his bladder outlet obstruction. Supportive Tx, DC IVF 2. Status post lumbar surgery: Further management per inpatient rehabilitation team  3. 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 ordered by Dr.Zekan   Subjective: Interval History:Sore mouth  Objective: Vital signs in last 24 hours: Temp:  [98.4 F (36.9 C)-98.9 F (37.2 C)] 98.8 F (37.1 C) (09/01 0455) Pulse Rate:  [95-102] 95 (09/01 0455) Resp:  [17-18] 17 (09/01 0455) BP: (107-137)/(62-68) 107/62 mmHg (09/01 0455) SpO2:  [93 %-100 %] 93 % (09/01 0455) Weight:  [64.6 kg (142 lb 6.7 oz)-67.8 kg (149 lb 7.6 oz)] 64.6 kg (142 lb 6.7 oz) (09/01 0500) Weight change:   Intake/Output from previous day: 08/31 0701 - 09/01 0700 In: 2690 [P.O.:1040; I.V.:1650] Out: 2400 [Urine:2400] Intake/Output this shift: Total I/O In: 360 [P.O.:360] Out: 1 [Stool:1]  Head: Normocephalic, without obvious abnormality, atraumatic, mouth with erythema Lungs clear with back brace Tr edema bilat LEs  Lab Results:  Recent Labs  01/01/14 1150  WBC 10.5  HGB 10.0*  HCT 31.1*  PLT 555*   BMET:  Recent Labs  01/01/14 0550 01/02/14 0441  NA 141 138  K 4.3 4.4  CL 107 105  CO2 22 21  GLUCOSE 90 87  BUN 75* 66*  CREATININE 3.56* 3.31*  CALCIUM 9.0 8.8   No results found for this basename: PTH,  in the last 72 hours Iron Studies: No results found for this basename: IRON, TIBC, TRANSFERRIN, FERRITIN,  in the last 72 hours Studies/Results: No results found.  Scheduled: . antiseptic oral rinse  15 mL Mouth Rinse BID  . enoxaparin (LOVENOX) injection  30 mg Subcutaneous Q24H  . famotidine  10 mg Oral Daily  . feeding supplement (NEPRO CARB STEADY)  237 mL Oral BID BM  . gabapentin  200 mg  Oral TID  . levothyroxine  50 mcg Oral QAC breakfast  . multivitamin  1 tablet Oral QHS  . pantoprazole  40 mg Oral BID  . polyethylene glycol  17 g Oral Daily  . simvastatin  10 mg Oral q1800  . tamsulosin  0.4 mg Oral BID     LOS: 6 days   Frederika Hukill C 01/02/2014,1:57 PM

## 2014-01-02 NOTE — Plan of Care (Signed)
Problem: SCI BLADDER ELIMINATION Goal: RH STG MANAGE BLADDER WITH ASSISTANCE STG Manage Bladder With mod Assistance  Outcome: Not Progressing Foley in place  Problem: RH PAIN MANAGEMENT Goal: RH STG PAIN MANAGED AT OR BELOW PT'S PAIN GOAL < 4  Outcome: Progressing Requesting PRN Tramadol q 6 hours

## 2014-01-02 NOTE — Progress Notes (Signed)
Occupational Therapy Session Note  Patient Details  Name: Lance [name] MRN: [id number] Date of Birth: 1941/01/13  Today's Date: 01/02/2014 OT Individual Time: 1100-1200 OT Individual Time Calculation (min): 60 min    Short Term Goals: Week 1:  OT Short Term Goal 1 (Week 1): Patient will complete toilet transfer at supervision level OT Short Term Goal 2 (Week 1): Patient will complete LB dressing with min assist with use of AE, prn OT Short Term Goal 3 (Week 1): Patient will complete 1 grooming task in standing with SBA  Skilled Therapeutic Interventions/Progress Updates:  Patient received supine in bed without back brace donned. Patient engaged in bed mobility and sat EOB with supervision. Therapist assisted with donning of back brace, then patient stood with rollator and ambulated into bathroom. Patient transferred onto tub transfer bench in order to doff clothes for shower. UB/LB bathing completed in seated position on tub transfer bench. Patient requires min verbal cues during session in order to remember lumbar corset wearing orders. Patient completed UB dressing (gown) and therapist donned lumbar corset. Patient then ambulated with rollator > w/c for LB dressing using reacher and sock aid. Therapist encouraged patient to purchase hip kit for use at home in order to increase his independence with LB ADLs. At end of session, left patient seated in w/c at sink completing grooming tasks with all needs within reach.   **Downgraded overall LTGs > supervision secondary to patient's poor carryover during therapy and decreased memory. Patient needs reminders and cues regarding lumbar corset wearing orders.   Precautions:  Precautions Precautions: Back;Fall Precaution Comments: pt only able to report 1/3 precautions, reviewed verbally Required Braces or Orthoses: Spinal Brace Spinal Brace: Lumbar corset;Applied in sitting position Restrictions Weight Bearing Restrictions: No  See FIM  for current functional status  Therapy/Group: Individual Therapy  [name] 01/02/2014, 2:26 PM

## 2014-01-02 NOTE — Progress Notes (Signed)
Physical Therapy Session Note  Patient Details  Name: Lance [name] MRN: [id number] Date of Birth: [birth year]  Today's Date: 01/02/2014 PT Individual Time: 1400-1500 PT Individual Time Calculation (min): 60 min   Short Term Goals: Week 1:  PT Short Term Goal 1 (Week 1): patient will perform bed mobility with rails and supervision PT Short Term Goal 2 (Week 1): patient will perform sit to stand and stand pivot transfer bed <> wheelchair with supervsion PT Short Term Goal 3 (Week 1): Patient will propel wheelchair 100 feet with supervision in comtrolled environment PT Short Term Goal 4 (Week 1): Patient will ambuate 75 feet with rolling walker and min steady assist in controlled environment  Skilled Therapeutic Interventions/Progress Updates:    Pt received seated in w/c wearing lumbar brace; agreeable to therapy. Session focused on increasing safety/independence with gait using rollator, initiating car transfers, and improving functional endurance. Pt performed w/c mobility x150' in controlled environment with bilat UE's and supervision. Gait 2x75' in controlled environment with rollator and min guard, increased time; no overt LOB. In ortho gym, performed stand pivot transfer from w/c<>simulated car with rollator and mod A for bilat LE management, min verbal/tactile cueing for adherence to spinal precautions. Multiple seated rest breaks required secondary to pt fatigue, pain in RLE (RN aware). Pt with effective between-session carryover of cueing provided during AM session to lock rollator with transfers. Session ended in pt room, where pt was left seated in w/c with all needs within reach.  Therapy Documentation Precautions:  Precautions Precautions: Back;Fall Precaution Comments: pt only able to report 1/3 precautions, reviewed verbally Required Braces or Orthoses: Spinal Brace Spinal Brace: Lumbar corset;Applied in sitting position Restrictions Weight Bearing Restrictions:  No Pain: Pain Assessment Pain Assessment: 0-10 Pain Score: 4  Pain Type: Acute pain Pain Location: Leg Pain Orientation: Right;Proximal;Lateral Pain Descriptors / Indicators: Tingling Pain Onset: Gradual Pain Intervention(s): RN made aware (RN already aware, present giving medication prior to session) Multiple Pain Sites: No  See FIM for current functional status  Therapy/Group: Individual Therapy  Hobble, Lorenda Ishihara 01/02/2014, 7:20 PM

## 2014-01-02 NOTE — Progress Notes (Signed)
Physical Therapy Session Note  Patient Details  Name: Lance [name] MRN: [id number] Date of Birth: [birth year]  Today's Date: 01/02/2014 PT Individual Time: 0830-0930 PT Individual Time Calculation (min): 60 min   Short Term Goals: Week 1:  PT Short Term Goal 1 (Week 1): patient will perform bed mobility with rails and supervision PT Short Term Goal 2 (Week 1): patient will perform sit to stand and stand pivot transfer bed <> wheelchair with supervsion PT Short Term Goal 3 (Week 1): Patient will propel wheelchair 100 feet with supervision in comtrolled environment PT Short Term Goal 4 (Week 1): Patient will ambuate 75 feet with rolling walker and min steady assist in controlled environment  Skilled Therapeutic Interventions/Progress Updates:   Pt's significant other, Lance [name], was present during session. Focused on functional bed mobility from 29" bed height to simulate home environment and focus on maintaining back precautions with S/min A, basic transfers with intermittent cueing needed to lock brakes on rollator, w/c propulsion for endurance and strengthening down to therapy gym, gait with rollator per pt preference (AD he was using PTA) and did very well with this Lance [name] is planing to measure doorway to make sure it will fit into bedroom/hallway), and dynamic gait and balance activity to locate horseshoes placed at various heights throughout gym and discussed using reacher for items on floor in home environment. Pt with rest breaks due to fatigue throughout session and left up in w/c for next session.  Therapy Documentation Precautions:  Precautions Precautions: Back;Fall Precaution Comments: pt only able to report 1/3 precautions, reviewed verbally Required Braces or Orthoses: Spinal Brace Spinal Brace: Lumbar corset;Applied in sitting position Restrictions Weight Bearing Restrictions: No Pain: C/o pain in RLE with mobility - premedicated.   See FIM for current functional  status  Therapy/Group: Individual Therapy  Karolee Stamps [facility] 01/02/2014, 11:37 AM

## 2014-01-02 NOTE — Progress Notes (Signed)
[facility] PHYSICAL MEDICINE & REHABILITATION     PROGRESS NOTE    Subjective/Complaints: Right leg sore. Trying to work through pain.   Objective: Vital Signs: Blood pressure 107/62, pulse 95, temperature 98.8 F (37.1 C), temperature source Oral, resp. rate 17, height  (1.727 m), weight 64.6 kg (142 lb 6.7 oz), SpO2 93.00%. No results found.  Recent Labs  01/01/14 1150  WBC 10.5  HGB 10.0*  HCT 31.1*  PLT 555*    Recent Labs  01/01/14 0550 01/02/14 0441  NA 141 138  K 4.3 4.4  CL 107 105  GLUCOSE 90 87  BUN 75* 66*  CREATININE 3.56* 3.31*  CALCIUM 9.0 8.8   CBG (last 3)  No results found for this basename: GLUCAP,  in the last 72 hours  Wt Readings from Last 3 Encounters:  01/02/14 64.6 kg (142 lb 6.7 oz)  12/22/13 62.5 kg (137 lb 12.6 oz)  12/22/13 62.5 kg (137 lb 12.6 oz)    Physical Exam:  Constitutional: He is oriented to person, place, and time. He appears well-developed and well-nourished.  HENT: oral mucosa pink/moist  Head: Normocephalic and atraumatic.  Eyes: Conjunctivae are normal. Pupils are equal, round, and reactive to light.  Neck: Normal range of motion. Neck supple.  Cardiovascular: Normal rate and regular rhythm.  Respiratory: Effort normal and breath sounds normal. No respiratory distress. He has no wheezes.  GI: He exhibits distension. Bowel sounds are increased. There is no tenderness.  Neurological: He is alert and oriented to person, place, and time.  Speech dysarthric. Follows commands without difficulty. UES: RUE: deltoid 5/5, bicep 5/5, tricep 5/5, wrist ext 5/5, hand intrinsics 5/5 LUE: deltoid 5/5, bicep 5/5, triceps 5/5, wrist ext 5/5, hand instrincs 5/5 RLE: Hip 2/5 pain, Isolated KE 2/5. HAD 3+/5. Ankle 4+/5 LLE: Hip 3+, KE 4/5, ankle 4/5.  Sensory changes over the anterior-lateral thigh to right knee. DTR's 1+  Skin: Skin is warm and dry.surgical site intact  M/S: back tender to touch and with proximal LE movement   Psychiatric: He has a normal mood and affect. His behavior is normal. Judgment and thought content normal Urology: catheter in place  Assessment/Plan: 1. Functional deficits secondary to L3 fx and associated radiculopathy which require 3+ hours per day of interdisciplinary therapy in a comprehensive inpatient rehab setting. Physiatrist is providing close team supervision and 24 hour management of active medical problems listed below. Physiatrist and rehab team continue to assess barriers to discharge/monitor patient progress toward functional and medical goals. FIM: FIM - Bathing Bathing Steps Patient Completed: Chest;Right Arm;Left Arm;Abdomen;Front perineal area;Buttocks;Right upper leg;Left upper leg;Right lower leg (including foot);Left lower leg (including foot) Bathing: 5: Supervision: Safety issues/verbal cues (using LH sponge)  FIM - Upper Body Dressing/Undressing Upper body dressing/undressing steps patient completed: Thread/unthread right sleeve of pullover shirt/dresss;Thread/unthread left sleeve of pullover shirt/dress;Put head through opening of pull over shirt/dress;Pull shirt over trunk Upper body dressing/undressing: 5: Set-up assist to: Obtain clothing/put away (and to don/doff brace) FIM - Lower Body Dressing/Undressing Lower body dressing/undressing steps patient completed: Thread/unthread right underwear leg;Thread/unthread left underwear leg;Pull underwear up/down;Thread/unthread right pants leg;Thread/unthread left pants leg;Pull pants up/down Lower body dressing/undressing: 3: Mod-Patient completed 50-74% of tasks (using reacher to assist)  FIM - Toileting Toileting steps completed by patient: Adjust clothing prior to toileting;Performs perineal hygiene;Adjust clothing after toileting Toileting Assistive Devices: Grab bar or rail for support;Toilet Aid/prosthesis/orthosis Toileting: 6: Assistive device: No helper  FIM - Archivist Transfers: 0-Activity  did  not occur  FIM - Bed/ChairBankeres: Walker;Orthosis;Arm rests Bed/Chair Transfer: 5: Supine > Sit: Supervision (verbal cues/safety issues);4: Bed > Chair or W/C: Min A (steadying Pt. > 75%)  FIM - Locomotion: Wheelchair Distance: 150 Locomotion: Wheelchair: 5: Travels 150 ft or more: maneuvers on rugs and over door sills with supervision, cueing or coaxing FIM - Locomotion: Ambulation Locomotion: Ambulation Assistive Devices: Walker - Rolling;Orthosis Ambulation/Gait Assistance: 4: Min assist Locomotion: Ambulation: 1: Travels less than 50 ft with minimal assistance (Pt.>75%)  Comprehension Comprehension Mode: Auditory Comprehension: 5-Follows basic conversation/direction: With no assist  Expression Expression Mode: Verbal Expression: 4-Expresses basic 75 - 89% of the time/requires cueing 10 - 24% of the time. Needs helper to occlude trach/needs to repeat words.  Social Interaction Social Interaction: 4-Interacts appropriately 75 - 89% of the time - Needs redirection for appropriate language or to initiate interaction.  Problem Solving Problem Solving: 4-Solves basic 75 - 89% of the time/requires cueing 10 - 24% of the time  Memory Memory: 3-Recognizes or recalls 50 - 74% of the time/requires cueing 25 - 49% of the time  Medical Problem List and Plan:  1. Functional deficits secondary to Rigth L3 radiculopathy , L3 compression fx s/p decompression/kyphoplasty  2. DVT Prophylaxis/Anticoagulation: Pharmaceutical: Lovenox  3. Pain Management: limited with meds due to renal status and narc tolerance  -will try lidoderm patches over leg; heat, ice ok too 4. Anxiety disorder/Mood:improved but still anxious  -reiterated to him that improved anxiety control will assist in pain control 5. Neuropsych: This patient is capable of making decisions on her own behalf.  6. Skin/Wound Care: Pressure relief measures.   7. Acute on chronic renal failure:  Chronic Hyponatremia with Na-128, BUN/Cr- 39/2.42 at admission. Need to push po fluids.  -continue IVF per renal recs  -keep watch of K+ as well  -scan for PVR BID, condom cath 8. Constipation: Will need aggressive bowel program.  Miralax. SSE prn.  9. H/o BPH: Increased Flomax to 0.8 mg.   -  coude cath    -Sees [facility] as outpt  -ua neg, culture neg 10. Renal amyloidosis:  -appreciate nephrology input. ( Sees Dr. Leretha Dykes in  [facility]).  -oncology consult yesterday---further workup/plan underway, received bx results yesterday .  12. Odynophagia: continue diflucan  -biotene for oral hygiene and moisture  LOS (Days) 6 A FACE TO FACE EVALUATION WAS PERFORMED  Lindsie Simar T 01/02/2014 7:41 AM

## 2014-01-02 NOTE — Progress Notes (Addendum)
Long discussion with patient's partner. He is extremely concerned about upcoming biopsy and does not feel that the patient is strong enough to get thorough this biopsy and feels rushed. He would prefer to wait till patient recovers from back surgery and then pursue further work up. Will leave a note for Dr. Shirline Frees. Called radiology and they will cancel the procedure.   He is also concerned about his poor po intake and sore mouth. Discussed again that thrush has been treated and that oral supplements should be sufficient at this time if patient does not want to eat "real food". MMW should help further.

## 2014-01-02 NOTE — Progress Notes (Signed)
  Echocardiogram 2D Echocardiogram has been performed.  Lance [name] 01/02/2014, 11:14 AM

## 2014-01-02 NOTE — H&P (Signed)
Lance [name] is an 73 y.o. male.   Chief Complaint: admitted with severe low back pain Wt loss; fatigue Found to have Lumbar 3 compression fracture Kyphoplasty with Neuro 12/22/13 Neg L3 biopsy;  Pain has gotten some better Recent diagnosis per pt of Renal amyloidosis per Bx Confirmed with records from [facility] Now consulted by Oncology Dr Delene Ruffini Request for consult for Bone Marrow biopsy---myeloma work up Chart has been reviewed I have seen and examined pt Now scheduled for same  HPI: GERD; wt loss; hypothyroidism; CKD; amyloidosis  Past Medical History  Diagnosis Date  . GERD (gastroesophageal reflux disease)   . Arthritis     osteoarthritis  . Hypothyroidism   . Chronic kidney disease     bph,kidney bx,renal insuff.  . Anxiety   . Weight decrease     20lbs    Past Surgical History  Procedure Laterality Date  . Rotator cuff repair  2004    right rotator cuff repair  . 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 Hand surgery    . Renal biopsy    . Laminectomy      L2 L3  WITH OPEN KYPHOPLASTY  . Lumbar laminectomy/decompression microdiscectomy Right 12/22/2013    Procedure: Right L/2-3LUMBAR DECOMPRESSION 1 LEVEL ;  Surgeon: Charlie Pitter, MD;  Location: [place] [facility];  Service: Neurosurgery;  Laterality: Right;  Right L/2-3LUMBAR DECOMPRESSION 1 LEVEL    History reviewed. No pertinent family history. Social History:  reports that he has never smoked. He has never used smokeless tobacco. He reports that he drinks alcohol. He reports that he does not use illicit drugs.  Allergies: No Known Allergies  Medications Prior to Admission  Medication Sig Dispense Refill  . docusate sodium (COLACE) 100 MG capsule Take 100 mg by mouth daily.      . famotidine (PEPCID  AC) 10 MG chewable tablet Chew 10 mg by mouth daily.      Marland Kitchen gabapentin (NEURONTIN) 300 MG capsule Take 300 mg by mouth 3 (three) times daily.      Marland Kitchen HYDROcodone-acetaminophen (NORCO/VICODIN) 5-325 MG per tablet Take 1 tablet by mouth every 6 (six) hours as needed for moderate pain (every 8 hrs).      Marland Kitchen levothyroxine (SYNTHROID) 50 MCG tablet Take 50 mcg by mouth daily.      . Menthol, Topical Analgesic, (BIOFREEZE EX) Apply 1 application topically 3 (three) times daily as needed (for pain).      . pravastatin (PRAVACHOL) 20 MG tablet Take 20 mg by mouth at bedtime.       . tamsulosin (FLOMAX) 0.4 MG CAPS capsule Take 0.4 mg by mouth daily.        Results for orders placed during the hospital encounter of 12/27/13 (from the past 48 hour(s))  RENAL FUNCTION PANEL     Status: Abnormal   Collection Time    01/01/14  5:50 AM      Result Value Ref Range   Sodium 141  137 - 147 mEq/L   Potassium 4.3  3.7 - 5.3 mEq/L   Chloride 107  96 - 112 mEq/L   CO2 22  19 - 32 mEq/L   Glucose, Bld 90  70 - 99 mg/dL   BUN 75 (*) 6 - 23 mg/dL   Creatinine, Ser 3.56 (*)  0.50 - 1.35 mg/dL   Calcium 9.0  8.4 - 10.5 mg/dL   Phosphorus 4.2  2.3 - 4.6 mg/dL   Albumin 2.1 (*) 3.5 - 5.2 g/dL   GFR calc non Af Amer 16 (*) >90 mL/min   GFR calc Af Amer 18 (*) >90 mL/min   Comment: (NOTE)     The eGFR has been calculated using the CKD EPI equation.     This calculation has not been validated in all clinical situations.     eGFR's persistently <90 mL/min signify possible Chronic Kidney     Disease.   Anion gap 12  5 - 15  URINALYSIS, ROUTINE W REFLEX MICROSCOPIC     Status: Abnormal   Collection Time    01/01/14 10:23 AM      Result Value Ref Range   Color, Urine YELLOW  YELLOW   APPearance CLEAR  CLEAR   Specific Gravity, Urine 1.013  1.005 - 1.030   pH 5.0  5.0 - 8.0   Glucose, UA NEGATIVE  NEGATIVE mg/dL   Hgb urine dipstick LARGE (*) NEGATIVE   Bilirubin Urine NEGATIVE  NEGATIVE   Ketones, ur NEGATIVE   NEGATIVE mg/dL   Protein, ur NEGATIVE  NEGATIVE mg/dL   Urobilinogen, UA 0.2  0.0 - 1.0 mg/dL   Nitrite NEGATIVE  NEGATIVE   Leukocytes, UA SMALL (*) NEGATIVE  URINE MICROSCOPIC-ADD ON     Status: Abnormal   Collection Time    01/01/14 10:23 AM      Result Value Ref Range   Squamous Epithelial / LPF RARE  RARE   WBC, UA 7-10  <3 WBC/hpf   RBC / HPF 21-50  <3 RBC/hpf   Bacteria, UA FEW (*) RARE  CBC WITH DIFFERENTIAL     Status: Abnormal   Collection Time    01/01/14 11:50 AM      Result Value Ref Range   WBC 10.5  4.0 - 10.5 K/uL   RBC 3.50 (*) 4.22 - 5.81 MIL/uL   Hemoglobin 10.0 (*) 13.0 - 17.0 g/dL   HCT 31.1 (*) 39.0 - 52.0 %   MCV 88.9  78.0 - 100.0 fL   MCH 28.6  26.0 - 34.0 pg   MCHC 32.2  30.0 - 36.0 g/dL   RDW 15.3  11.5 - 15.5 %   Platelets 555 (*) 150 - 400 K/uL   Neutrophils Relative % 70  43 - 77 %   Neutro Abs 7.4  1.7 - 7.7 K/uL   Lymphocytes Relative 14  12 - 46 %   Lymphs Abs 1.5  0.7 - 4.0 K/uL   Monocytes Relative 10  3 - 12 %   Monocytes Absolute 1.1 (*) 0.1 - 1.0 K/uL   Eosinophils Relative 5  0 - 5 %   Eosinophils Absolute 0.5  0.0 - 0.7 K/uL   Basophils Relative 1  0 - 1 %   Basophils Absolute 0.1  0.0 - 0.1 K/uL  PRO B NATRIURETIC PEPTIDE     Status: Abnormal   Collection Time    01/01/14  6:37 PM      Result Value Ref Range   Pro B Natriuretic peptide (BNP) 6300.0 (*) 0 - 125 pg/mL  RENAL FUNCTION PANEL     Status: Abnormal   Collection Time    01/02/14  4:41 AM      Result Value Ref Range   Sodium 138  137 - 147 mEq/L   Potassium 4.4  3.7 - 5.3 mEq/L  Chloride 105  96 - 112 mEq/L   CO2 21  19 - 32 mEq/L   Glucose, Bld 87  70 - 99 mg/dL   BUN 66 (*) 6 - 23 mg/dL   Creatinine, Ser 3.31 (*) 0.50 - 1.35 mg/dL   Calcium 8.8  8.4 - 10.5 mg/dL   Phosphorus 3.4  2.3 - 4.6 mg/dL   Albumin 2.1 (*) 3.5 - 5.2 g/dL   GFR calc non Af Amer 17 (*) >90 mL/min   GFR calc Af Amer 20 (*) >90 mL/min   Comment: (NOTE)     The eGFR has been calculated  using the CKD EPI equation.     This calculation has not been validated in all clinical situations.     eGFR's persistently <90 mL/min signify possible Chronic Kidney     Disease.   Anion gap 12  5 - 15  PROTIME-INR     Status: Abnormal   Collection Time    01/02/14  7:30 AM      Result Value Ref Range   Prothrombin Time 15.6 (*) 11.6 - 15.2 seconds   INR 1.24  0.00 - 1.49  APTT     Status: Abnormal   Collection Time    01/02/14  7:30 AM      Result Value Ref Range   aPTT 41 (*) 24 - 37 seconds   Comment:            IF BASELINE aPTT IS ELEVATED,     SUGGEST PATIENT RISK ASSESSMENT     BE USED TO DETERMINE APPROPRIATE     ANTICOAGULANT THERAPY.   No results found.  Review of Systems  Constitutional: Positive for weight loss. Negative for fever.  Respiratory: Negative for shortness of breath.   Cardiovascular: Negative for chest pain.  Gastrointestinal: Negative for nausea.  Musculoskeletal: Positive for back pain.  Neurological: Positive for weakness and headaches. Negative for dizziness.  Psychiatric/Behavioral: Negative for substance abuse.    Blood pressure 107/62, pulse 95, temperature 98.8 F (37.1 C), temperature source Oral, resp. rate 17, height _0  (1.727 m), weight 64.6 kg (142 lb 6.7 oz), SpO2 93.00%. Physical Exam  Constitutional: He is oriented to person, place, and time. He appears well-developed.  thin  Cardiovascular: Normal rate and regular rhythm.   No murmur heard. Respiratory: Effort normal and breath sounds normal.  GI: Soft. Bowel sounds are normal. There is no tenderness.  Musculoskeletal: Normal range of motion.  Neurological: He is alert and oriented to person, place, and time.  Skin: Skin is warm and dry.  Psychiatric: He has a normal mood and affect. His behavior is normal. Judgment and thought content normal.     Assessment/Plan Back pain- L3 KP 8/2; Bx neg Amyloidosis per Bx at [facility] Oncology request BM biopsy- MM work up Pt scheduled  for this in am Pt aware of procedure benefits and risks and agreeable to proceed Consent signed andin chart  Elija Mccamish A 01/02/2014, 10:46 AM

## 2014-01-02 NOTE — Progress Notes (Signed)
Recreational Therapy Session Note  Patient Details  Name: Lance [name] MRN: [id number] Date of Birth: [birth year] Today's Date: 01/02/2014  Pain: no c/o Skilled Therapeutic Interventions/Progress Updates: Session focused on identifying potential activities for participation post discharge including adaptations/modifications.  Pt's partner [name] present, participatory, & encouraging pt during discussion.  Assisted pt with toileting.  Pt ambulated with rollator to bathroom with close supervision.  Pt performed 2/3 toileting tasks.     Therapy/Group: Individual Therapy   Jackob Crookston 01/02/2014, 3:16 PM

## 2014-01-02 NOTE — Progress Notes (Signed)
CT biopsy re-schedule per family request.

## 2014-01-03 ENCOUNTER — Inpatient Hospital Stay (HOSPITAL_COMMUNITY): Payer: Medicare Other | Admitting: Occupational Therapy

## 2014-01-03 ENCOUNTER — Inpatient Hospital Stay (HOSPITAL_COMMUNITY): Payer: Medicare Other

## 2014-01-03 ENCOUNTER — Ambulatory Visit (HOSPITAL_COMMUNITY): Payer: Medicare Other

## 2014-01-03 DIAGNOSIS — S32009A Unspecified fracture of unspecified lumbar vertebra, initial encounter for closed fracture: Secondary | ICD-10-CM

## 2014-01-03 DIAGNOSIS — R339 Retention of urine, unspecified: Secondary | ICD-10-CM

## 2014-01-03 DIAGNOSIS — N189 Chronic kidney disease, unspecified: Secondary | ICD-10-CM

## 2014-01-03 DIAGNOSIS — IMO0002 Reserved for concepts with insufficient information to code with codable children: Secondary | ICD-10-CM

## 2014-01-03 DIAGNOSIS — W240XXA Contact with lifting devices, not elsewhere classified, initial encounter: Secondary | ICD-10-CM

## 2014-01-03 DIAGNOSIS — Z5189 Encounter for other specified aftercare: Secondary | ICD-10-CM

## 2014-01-03 LAB — UIFE/LIGHT CHAINS/TP QN, 24-HR UR
ALBUMIN, U: DETECTED
Alpha 1, Urine: DETECTED — AB
Alpha 2, Urine: DETECTED — AB
BETA UR: DETECTED — AB
GAMMA UR: DETECTED — AB
Total Protein, Urine: 48 mg/dL — ABNORMAL HIGH (ref 5–25)

## 2014-01-03 LAB — CBC
HCT: 27.5 % — ABNORMAL LOW (ref 39.0–52.0)
Hemoglobin: 8.9 g/dL — ABNORMAL LOW (ref 13.0–17.0)
MCH: 28.6 pg (ref 26.0–34.0)
MCHC: 32.4 g/dL (ref 30.0–36.0)
MCV: 88.4 fL (ref 78.0–100.0)
Platelets: 462 10*3/uL — ABNORMAL HIGH (ref 150–400)
RBC: 3.11 MIL/uL — AB (ref 4.22–5.81)
RDW: 15.5 % (ref 11.5–15.5)
WBC: 11.3 10*3/uL — AB (ref 4.0–10.5)

## 2014-01-03 LAB — BONE MARROW EXAM

## 2014-01-03 MED ORDER — LIDOCAINE HCL 1 % IJ SOLN
INTRAMUSCULAR | Status: AC
  Filled 2014-01-03: qty 10

## 2014-01-03 MED ORDER — FENTANYL CITRATE 0.05 MG/ML IJ SOLN
INTRAMUSCULAR | Status: AC
  Filled 2014-01-03: qty 4

## 2014-01-03 MED ORDER — MIDAZOLAM HCL 2 MG/2ML IJ SOLN
INTRAMUSCULAR | Status: AC
  Filled 2014-01-03: qty 4

## 2014-01-03 MED ORDER — FENTANYL CITRATE 0.05 MG/ML IJ SOLN
INTRAMUSCULAR | Status: AC | PRN
  Administered 2014-01-03: 25 ug via INTRAVENOUS

## 2014-01-03 MED ORDER — LIDOCAINE 5 % EX PTCH
2.0000 | MEDICATED_PATCH | CUTANEOUS | Status: DC
  Administered 2014-01-03 – 2014-01-06 (×4): 2 via TRANSDERMAL
  Filled 2014-01-03 (×6): qty 2

## 2014-01-03 MED ORDER — MIDAZOLAM HCL 2 MG/2ML IJ SOLN
INTRAMUSCULAR | Status: AC | PRN
  Administered 2014-01-03: 1 mg via INTRAVENOUS

## 2014-01-03 MED ORDER — SODIUM CHLORIDE 0.9 % IV SOLN
INTRAVENOUS | Status: AC | PRN
  Administered 2014-01-03: 10 mL/h via INTRAVENOUS

## 2014-01-03 MED ORDER — NEPRO/CARBSTEADY PO LIQD
237.0000 mL | Freq: Three times a day (TID) | ORAL | Status: DC
  Administered 2014-01-03 – 2014-01-05 (×5): 237 mL via ORAL

## 2014-01-03 NOTE — Progress Notes (Signed)
NUTRITION FOLLOW-UP  Pt meets criteria for NON-SEVERE (MODERATE) MALNUTRITION in the context of chronic illness as evidenced by a weight loss of 15% in 8 month, energy intake < 75% for >/= 1 month, and moderate fat and muscle mass depletion.  DOCUMENTATION CODES Per approved criteria  -Non-severe (moderate) malnutrition in the context of chronic illness   INTERVENTION: Increase Nepro Shake po to TID, each supplement provides 425 kcal and 19 grams protein.  Encourage PO intake.  NUTRITION DIAGNOSIS: Inadequate oral intake related to decreased appetite and mouth pains as evidenced by meal completion of 10-70%,; ongoing   Goal: Pt to meet >/= 90% of their estimated nutrition needs, not met  Monitor:  PO intake, weight trends, labs, I/O's  73 y.o. male  Admitting Dx: Myelopathy of lumbar region  ASSESSMENT: Pt with PMH of CKD, anxiety disorder, recent 20 lbs weight loss with 3 month h/o progressive weakness and malaise, Lifting injury 2 months ago with subsequent L3 compression Fx with stenosis and L3 radiculopathy. He has failed conservative treatment and elected to undergo L2/3 decompressive laminectomy with foraminotomy and open kyphoplasty vertebroplasty on 12/22/13.  8/27-Meal completion is varied from 10-70%. Pt reports he has been having mouth pains/sores, which have been causing him to eat less. Pt also reports having a decreased appetite, which has been going on since the beginning of the year. Dietary recall is eggs at breakfast, salad with protein at lunch, and pork chops or another animal protein at dinner.Pt reports he has been noticing that he has been eating less with smaller portions at meals.  Pt reports he has lost weight over the past 8 months with his usual body weight of 162 lbs. Noted pt with a 15% weight loss in 8 months. Pt reports he has been drinking Nepro at home to help with calorie and protein needs and would like to drink some while hospitalized. Will order.    Dietitian was additionally consulted for a renal diet education. Pt was educated on limiting/avoiding high containing potassium, sodium, and phosphorous foods.   9/2-Pt reports having a decreased appetite. Pt reports his mouth sores are also keeping him from eating his food at meals. Meal completion is 10-100%, however lately po has been poor. Pt reports drinking her Nepro shakes and likes them. He would like them increased to TID. Will modify orders. Pt was encouraged to eat at much as he can at meals.  Height: Ht Readings from Last 1 Encounters:  01/01/14 5' 8"  (1.727 m)    Weight: Wt Readings from Last 1 Encounters:  01/03/14 138 lb 14.2 oz (63 kg)   BMI:  20.8  Re-Estimated Nutritional Needs: Kcal: 1850-2050 Protein: 65-80 gram  Fluid: 1.85 - 2.05 L/day  Skin: back incision, right hip incision  Diet Order: General   Intake/Output Summary (Last 24 hours) at 01/03/14 1655 Last data filed at 01/03/14 1300  Gross per 24 hour  Intake    360 ml  Output   2450 ml  Net  -2090 ml    Last BM: 9/1  Labs:   Recent Labs Lab 12/31/13 0625 01/01/14 0550 01/02/14 0441  NA 138 141 138  K 4.1 4.3 4.4  CL 102 107 105  CO2 20 22 21   BUN 92* 75* 66*  CREATININE 3.86* 3.56* 3.31*  CALCIUM 9.0 9.0 8.8  PHOS 5.0* 4.2 3.4  GLUCOSE 91 90 87    CBG (last 3)  No results found for this basename: GLUCAP,  in the last 72 hours  Scheduled Meds: . antiseptic oral rinse  15 mL Mouth Rinse BID  . enoxaparin (LOVENOX) injection  30 mg Subcutaneous Q24H  . famotidine  10 mg Oral Daily  . feeding supplement (NEPRO CARB STEADY)  237 mL Oral BID BM  . fentaNYL      . gabapentin  200 mg Oral TID  . levothyroxine  50 mcg Oral QAC breakfast  . lidocaine  2 patch Transdermal Q24H  . lidocaine      . magic mouthwash  5 mL Oral QID  . midazolam      . multivitamin  1 tablet Oral QHS  . pantoprazole  40 mg Oral BID  . polyethylene glycol  17 g Oral Daily  . simvastatin  10 mg Oral  q1800  . tamsulosin  0.4 mg Oral BID    Continuous Infusions:   Past Medical History  Diagnosis Date  . GERD (gastroesophageal reflux disease)   . Arthritis     osteoarthritis  . Hypothyroidism   . Chronic kidney disease     bph,kidney bx,renal insuff.  . Anxiety   . Weight decrease     20lbs    Past Surgical History  Procedure Laterality Date  . Rotator cuff repair  2004    right rotator cuff repair  . 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 Hand surgery    . Renal biopsy    . Laminectomy      L2 L3  WITH OPEN KYPHOPLASTY  . Lumbar laminectomy/decompression microdiscectomy Right 12/22/2013    Procedure: Right L/2-3LUMBAR DECOMPRESSION 1 LEVEL ;  Surgeon: Charlie Pitter, MD;  Location: [place] [facility];  Service: Neurosurgery;  Laterality: Right;  Right L/2-3LUMBAR DECOMPRESSION 1 LEVEL    Kallie Locks, MS, Provisional LDN Pager # [phone] After hours/ weekend pager # [phone]

## 2014-01-03 NOTE — Procedures (Signed)
CT guided bone marrow biopsy.  No immediate complication.   

## 2014-01-03 NOTE — Progress Notes (Signed)
Occupational Therapy Session Note  Patient Details  Name: Lance [name] MRN: [id number] Date of Birth: 05-Dec-1940  Today's Date: 01/03/2014  Session #1 OT Individual Time: 1400-1500 OT Individual Time Calculation (min): 60 min   Short Term Goals: Week 1:  OT Short Term Goal 1 (Week 1): Patient will complete toilet transfer at supervision level OT Short Term Goal 2 (Week 1): Patient will complete LB dressing with min assist with use of AE, prn OT Short Term Goal 3 (Week 1): Patient will complete 1 grooming task in standing with SBA  Skilled Therapeutic Interventions/Progress Updates:  Patient received supine in bed with RN present. During session, patient with up to 10/10 complaints of pain which increased during mobility and ambulation. Patient engaged in bed mobility and sat EOB for therapist to don lumbar corset. Patient stood with RW and ambulated > family room; patient NPO all day prior to this session and hungry. In Family room, patient transferred onto chair and ate some graham crackers and apple sauce. During eating, therapist talked with patient about functional mobility/transfers at home regarding showering and toileting. Patient has comfort height toilet seats and does not need a BSC. Patient stated he has a built in shower seat and removable shower seat; no shower equipment recommended at this time. Patient then engaged in simulated walk-in shower transfer. Therapist educated patient on stepping over threshold and into shower backwards for safety with rollator during transfer in/out of shower. Therapist also talked with patient about lumbar corset wearing orders and importance of abiding by these orders. Told patient ONLY time he is able to doff brace is when laying down and when sitting on shower seat prior to showering. Patient ambulated back to room and transferred back to bed. Left patient supine in bed with all needs within reach and NT present.   Precautions:   Precautions Precautions: Back;Fall Precaution Comments: pt only able to report 1/3 precautions, reviewed verbally Required Braces or Orthoses: Spinal Brace Spinal Brace: Lumbar corset;Applied in sitting position Restrictions Weight Bearing Restrictions: No  See FIM for current functional status  Delcie Ruppert 01/03/2014, 3:27 PM

## 2014-01-03 NOTE — Progress Notes (Signed)
Occupational Therapy Session Note  Patient Details  Name: Lance [name] MRN: [id number] Date of Birth: 12-18-1940  Today's Date: 01/03/2014 OT Individual Time: 1105-1210 OT Individual Time Calculation (min): 65 min   Short Term Goals: Week 1:  OT Short Term Goal 1 (Week 1): Patient will complete toilet transfer at supervision level OT Short Term Goal 2 (Week 1): Patient will complete LB dressing with min assist with use of AE, prn OT Short Term Goal 3 (Week 1): Patient will complete 1 grooming task in standing with SBA  Skilled Therapeutic Interventions/Progress Updates:  Patient in bed upon arrival with SO, [name], at his side.  Engaged in shower, dress, and groom tasks.  Focused session on back precautions, activity tolerance,  Use of AE and sit><stands.  Patient practiced using the LH sponge and reacher yet declined to practice socks today due to right thigh pain.  Patient remained seated for grooming.  [name] present for beginning of session.  Therapy Documentation Precautions:  Precautions Precautions: Back;Fall Precaution Comments: pt only able to report 1/3 precautions, reviewed verbally Required Braces or Orthoses: Spinal Brace Spinal Brace: Lumbar corset;Applied in sitting position Restrictions Weight Bearing Restrictions: No Pain: 10/10 right thigh and hip from biopsy, RN aware and medication provided,  ADL: See FIM for current functional status  Therapy/Group: Individual Therapy  Allysha Tryon 01/03/2014, 9:33 AM

## 2014-01-03 NOTE — Progress Notes (Signed)
[facility] PHYSICAL MEDICINE & REHABILITATION     PROGRESS NOTE    Subjective/Complaints: Right leg buckled a bit when getting weighed this am. For bone marrow bx later this morning.   Objective: Vital Signs: Blood pressure 119/64, pulse 93, temperature 99.7 F (37.6 C), temperature source Oral, resp. rate 17, height  (1.727 m), weight 63 kg (138 lb 14.2 oz), SpO2 94.00%. No results found.  Recent Labs  01/01/14 1150  WBC 10.5  HGB 10.0*  HCT 31.1*  PLT 555*    Recent Labs  01/01/14 0550 01/02/14 0441  NA 141 138  K 4.3 4.4  CL 107 105  GLUCOSE 90 87  BUN 75* 66*  CREATININE 3.56* 3.31*  CALCIUM 9.0 8.8   CBG (last 3)  No results found for this basename: GLUCAP,  in the last 72 hours  Wt Readings from Last 3 Encounters:  01/03/14 63 kg (138 lb 14.2 oz)  12/22/13 62.5 kg (137 lb 12.6 oz)  12/22/13 62.5 kg (137 lb 12.6 oz)    Physical Exam:  Constitutional: He is oriented to person, place, and time. He appears well-developed and well-nourished.  HENT: oral mucosa pink/moist  Head: Normocephalic and atraumatic.  Eyes: Conjunctivae are normal. Pupils are equal, round, and reactive to light.  Neck: Normal range of motion. Neck supple.  Cardiovascular: Normal rate and regular rhythm.  Respiratory: Effort normal and breath sounds normal. No respiratory distress. He has no wheezes.  GI: He exhibits distension. Bowel sounds are increased. There is no tenderness.  Neurological: He is alert and oriented to person, place, and time.  Speech dysarthric. Follows commands without difficulty. UES: RUE: deltoid 5/5, bicep 5/5, tricep 5/5, wrist ext 5/5, hand intrinsics 5/5 LUE: deltoid 5/5, bicep 5/5, triceps 5/5, wrist ext 5/5, hand instrincs 5/5 RLE: Hip 3-/5 pain, Isolated KE 3/5. HAD 3+/5. Ankle 4+/5 LLE: Hip 3+, KE 4/5, ankle 4/5.  Sensory changes over the anterior-lateral thigh to right knee. DTR's 1+  Skin: Skin is warm and dry.surgical site intact  M/S: back  tender to touch and with proximal LE movement  Psychiatric: He has a normal mood and affect. His behavior is normal. Judgment and thought content normal Urology: catheter in place  Assessment/Plan: 1. Functional deficits secondary to L3 fx and associated radiculopathy which require 3+ hours per day of interdisciplinary therapy in a comprehensive inpatient rehab setting. Physiatrist is providing close team supervision and 24 hour management of active medical problems listed below. Physiatrist and rehab team continue to assess barriers to discharge/monitor patient progress toward functional and medical goals.  FIM: FIM - Bathing Bathing Steps Patient Completed: Chest;Right Arm;Left Arm;Abdomen;Front perineal area;Buttocks;Right upper leg;Left upper leg;Right lower leg (including foot);Left lower leg (including foot) Bathing: 5: Supervision: Safety issues/verbal cues (using LH sponge)  FIM - Upper Body Dressing/Undressing Upper body dressing/undressing steps patient completed: Thread/unthread right sleeve of pullover shirt/dresss;Thread/unthread left sleeve of pullover shirt/dress;Put head through opening of pull over shirt/dress;Pull shirt over trunk Upper body dressing/undressing: 5: Set-up assist to: Obtain clothing/put away (and to don/doff brace) FIM - Lower Body Dressing/Undressing Lower body dressing/undressing steps patient completed: Thread/unthread right underwear leg;Thread/unthread left underwear leg;Pull underwear up/down;Thread/unthread right pants leg;Thread/unthread left pants leg;Pull pants up/down Lower body dressing/undressing: 3: Mod-Patient completed 50-74% of tasks (using reacher to assist)  FIM - Toileting Toileting steps completed by patient: Adjust clothing prior to toileting;Performs perineal hygiene;Adjust clothing after toileting Toileting Assistive Devices: Grab bar or rail for support;Toilet Aid/prosthesis/orthosis Toileting: 6: Assistive device: No helper  FIM -  Archivist Transfers: 0-Activity did not occur  FIM - Banker Devices: Walker;Orthosis;Arm rests (rollator) Bed/Chair Transfer: 4: Bed > Chair or W/C: Min A (steadying Pt. > 75%);4: Chair or W/C > Bed: Min A (steadying Pt. > 75%)  FIM - Locomotion: Wheelchair Distance: 150 Locomotion: Wheelchair: 5: Travels 150 ft or more: maneuvers on rugs and over door sills with supervision, cueing or coaxing FIM - Locomotion: Ambulation Locomotion: Ambulation Assistive Devices: Other (comment);Orthosis (rollator) Ambulation/Gait Assistance: 4: Min guard Locomotion: Ambulation: 2: Travels 50 - 149 ft with minimal assistance (Pt.>75%)  Comprehension Comprehension Mode: Asleep Comprehension: 5-Follows basic conversation/direction: With no assist  Expression Expression Mode: Asleep Expression: 4-Expresses basic 75 - 89% of the time/requires cueing 10 - 24% of the time. Needs helper to occlude trach/needs to repeat words.  Social Interaction Social Interaction Mode: Asleep Social Interaction: 5-Interacts appropriately 90% of the time - Needs monitoring or encouragement for participation or interaction.  Problem Solving Problem Solving Mode: Asleep Problem Solving: 4-Solves basic 75 - 89% of the time/requires cueing 10 - 24% of the time  Memory Memory Mode: Asleep Memory: 4-Recognizes or recalls 75 - 89% of the time/requires cueing 10 - 24% of the time  Medical Problem List and Plan:  1. Functional deficits secondary to Rigth L3 radiculopathy , L3 compression fx s/p decompression/kyphoplasty  2. DVT Prophylaxis/Anticoagulation: Pharmaceutical: Lovenox  3. Pain Management: limited with meds due to renal status and narc tolerance  -  lidoderm patches over leg; heat, ice ok too 4. Anxiety disorder/Mood:improved but still anxious  -reiterated to him that improved anxiety control will assist in pain control 5. Neuropsych: This patient is capable  of making decisions on her own behalf.  6. Skin/Wound Care: Pressure relief measures.   7. Acute on chronic renal failure: Chronic Hyponatremia with Na-128, BUN/Cr- 39/2.42 at admission. Need to push po fluids.  -continue IVF per renal recs  -keep watch of K+ as well  -scan for PVR BID, condom cath 8. Constipation: Will need aggressive bowel program.  Miralax. SSE prn.  9. H/o BPH:  Flomax to 0.8 mg.   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  12. Odynophagia:    -biotene for oral hygiene and moisture  LOS (Days) 7 A FACE TO FACE EVALUATION WAS PERFORMED  Lance [name] 01/03/2014 8:07 AM

## 2014-01-03 NOTE — Progress Notes (Signed)
Rested good last night. Complained of pain to RLE this AM. Spoke with Dr. Riley Kill ok to give PRN ultram with [name] sip of H20. Lance [name]

## 2014-01-03 NOTE — Progress Notes (Signed)
Physical Therapy Session Note  Patient Details  Name: Lance [name] MRN: [id number] Date of Birth: 1941-04-25  Today's Date: 01/03/2014 PT Individual Time: 0757-0857 PT Individual Time Calculation (min): 60 min   Skilled Therapeutic Interventions/Progress Updates:    Pt reports feeling weak today (has been NPO for CT later today) but willing and agreeable to therapy. W/c propulsion down to therapy gym for general UE strengthening and endurance. Practiced bed <-> w/c transfers with rollator (cues for use of brakes initially but then did not need further cueing ) to bed height of 29" to simulate home set-up with overall steady A and S for bed mobility. Demonstrated use of leg lifter as pt has been having difficulty with RLE management, but able to do independently today. Introduced [place] HEP for LE strengthening including LAQ with 5 second hold, standing hip abduction, hamstring curls, mini squats, and heel/toe raises x 10 reps each BLE. Transferred back to bed end of session to rest due to fatigue and positioned for comfort. Both pt and his partner appear anxious for procedure today; emotional support provided.  Therapy Documentation Precautions:  Precautions Precautions: Back;Fall Precaution Comments: pt only able to report 1/3 precautions, reviewed verbally Required Braces or Orthoses: Spinal Brace Spinal Brace: Lumbar corset;Applied in sitting position Restrictions Weight Bearing Restrictions: No  Pain: Premedicated earlier. Still reporting pain in RLE.  See FIM for current functional status  Therapy/Group: Individual Therapy  Karolee Stamps [facility] 01/03/2014, 8:57 AM

## 2014-01-04 ENCOUNTER — Encounter (HOSPITAL_COMMUNITY): Payer: Medicare Other

## 2014-01-04 ENCOUNTER — Inpatient Hospital Stay (HOSPITAL_COMMUNITY): Payer: Medicare Other | Admitting: Occupational Therapy

## 2014-01-04 ENCOUNTER — Encounter (HOSPITAL_COMMUNITY): Payer: Medicare Other | Admitting: Occupational Therapy

## 2014-01-04 ENCOUNTER — Inpatient Hospital Stay (HOSPITAL_COMMUNITY): Payer: Medicare Other | Admitting: *Deleted

## 2014-01-04 LAB — MULTIPLE MYELOMA PANEL, SERUM
ALPHA-2-GLOBULIN: 18.4 % — AB (ref 7.1–11.8)
Albumin ELP: 48.1 % — ABNORMAL LOW (ref 55.8–66.1)
Alpha-1-Globulin: 10.6 % — ABNORMAL HIGH (ref 2.9–4.9)
Beta 2: 6.4 % (ref 3.2–6.5)
Beta Globulin: 5.8 % (ref 4.7–7.2)
Gamma Globulin: 10.7 % — ABNORMAL LOW (ref 11.1–18.8)
IGG (IMMUNOGLOBIN G), SERUM: 584 mg/dL — AB (ref 650–1600)
IgA: 145 mg/dL (ref 68–379)
IgM, Serum: 40 mg/dL — ABNORMAL LOW (ref 41–251)
M-SPIKE, %: 0.07 g/dL
Total Protein: 5 g/dL — ABNORMAL LOW (ref 6.0–8.3)

## 2014-01-04 LAB — RENAL FUNCTION PANEL
ALBUMIN: 2.5 g/dL — AB (ref 3.5–5.2)
ANION GAP: 15 (ref 5–15)
BUN: 59 mg/dL — AB (ref 6–23)
CALCIUM: 9.7 mg/dL (ref 8.4–10.5)
CO2: 22 mEq/L (ref 19–32)
CREATININE: 3.12 mg/dL — AB (ref 0.50–1.35)
Chloride: 103 mEq/L (ref 96–112)
GFR calc Af Amer: 22 mL/min — ABNORMAL LOW (ref >90)
GFR calc non Af Amer: 19 mL/min — ABNORMAL LOW (ref >90)
GLUCOSE: 141 mg/dL — AB (ref 70–99)
Phosphorus: 3.7 mg/dL (ref 2.3–4.6)
Potassium: 3.9 mEq/L (ref 3.7–5.3)
Sodium: 140 mEq/L (ref 137–147)

## 2014-01-04 NOTE — Progress Notes (Signed)
Social Work Patient ID: Lance [name], male   DOB: October 30, 1940, 73 y.o.   MRN: [id number]  CSW met with pt yesterday to update him on team conference discussion and spoke with [name] via telephone.  Pt was pleased to learn that he would be discharged on 01-06-14 and did not have any concerns about going home.  [name], however, was quite concerned that pt was too weak for d/c on Saturday.  He was also worried about pt not being able to eat due to thrush in his mouth.  CSW told [name] that CSW would pass these concerns on to medical team.  CSW told both the PA and MD about the above.  They have both talked with the pt/[name] multiple times and will continue to discuss with them.  CSW will f/u with [name]/pt today, as well.  CSW preparing pt for d/c with HH f/u, DME, and MD f/u appt.

## 2014-01-04 NOTE — Progress Notes (Signed)
1. Acute renal failure on chronic kidney disease stage IV, slow improvement continues: Appears to be primarily hemodynamic from intermittent episodes of hypotension postoperatively and with a minor role from his bladder outlet obstruction. Supportive Tx, F/U labs today  2. Status post lumbar surgery: Further management per inpatient rehabilitation team  3. 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 ordered by Dr.Zekan; Bone marrow pending  Subjective: Interval History: Had BM yesterday  Objective: Vital signs in last 24 hours: Temp:  [97.8 F (36.6 C)-99.3 F (37.4 C)] 99 F (37.2 C) (09/03 0506) Pulse Rate:  [88-101] 101 (09/03 0506) Resp:  [17-18] 17 (09/03 0506) BP: (103-119)/(61-69) 107/61 mmHg (09/03 0506) SpO2:  [95 %-98 %] 95 % (09/03 0506) Weight:  [61.4 kg (135 lb 5.8 oz)] 61.4 kg (135 lb 5.8 oz) (09/03 0506) Weight change:   Intake/Output from previous day: 09/02 0701 - 09/03 0700 In: 480 [P.O.:480] Out: 1850 [Urine:1850] Intake/Output this shift: Total I/O In: 240 [P.O.:240] Out: -   Resp: clear to auscultation bilaterally Chest wall: no tenderness; wearing brace Cardio: regular rate and rhythm, S1, S2 normal, no murmur, click, rub or gallop Extremities: extremities normal, atraumatic, no cyanosis or edema  Lab Results:  Recent Labs  01/03/14 1105  WBC 11.3*  HGB 8.9*  HCT 27.5*  PLT 462*   BMET:  Recent Labs  01/02/14 0441  NA 138  K 4.4  CL 105  CO2 21  GLUCOSE 87  BUN 66*  CREATININE 3.31*  CALCIUM 8.8   No results found for this basename: PTH,  in the last 72 hours Iron Studies: No results found for this basename: IRON, TIBC, TRANSFERRIN, FERRITIN,  in the last 72 hours Studies/Results: Ct Biopsy  01/03/2014   CLINICAL DATA:  73 year old with history of amyloidosis and request for bone marrow biopsy.  EXAM: CT GUIDED BONE MARROW ASPIRATES AND BIOPSY  Physician: Stephan Minister. Anselm Pancoast, MD  MEDICATIONS: 1 mg  versed, 25 mcg fentanyl. A radiology nurse monitored the patient for moderate sedation.  ANESTHESIA/SEDATION: Sedation time: 11 min  PROCEDURE: Informed consent was obtained for a bone marrow biopsy. The patient was placed on his left side. Images of the pelvis were obtained. The left side of back was prepped and draped in sterile fashion. The skin and left posterior iliac bone were anesthetized with 1% lidocaine. 11 gauge bone needle was directed into the left iliac bone with CT guidance. Two aspirates and 1 core biopsy obtained.  FINDINGS: Bone needle directed into the posterior left iliac bone.  COMPLICATIONS: None  IMPRESSION: CT guided bone marrow aspirates and core biopsy.   Electronically Signed   By: Markus Daft M.D.   On: 01/03/2014 13:13   Scheduled: . antiseptic oral rinse  15 mL Mouth Rinse BID  . enoxaparin (LOVENOX) injection  30 mg Subcutaneous Q24H  . famotidine  10 mg Oral Daily  . feeding supplement (NEPRO CARB STEADY)  237 mL Oral TID BM  . gabapentin  200 mg Oral TID  . levothyroxine  50 mcg Oral QAC breakfast  . lidocaine  2 patch Transdermal Q24H  . magic mouthwash  5 mL Oral QID  . multivitamin  1 tablet Oral QHS  . pantoprazole  40 mg Oral BID  . polyethylene glycol  17 g Oral Daily  . simvastatin  10 mg Oral q1800  . tamsulosin  0.4 mg Oral BID     LOS: 8 days   Ahnesty Finfrock C 01/04/2014,12:44 PM

## 2014-01-04 NOTE — Patient Care Conference (Signed)
Inpatient RehabilitationTeam Conference and Plan of Care Update Date: 01/02/2014   Time: 2:25 PM    Patient Name: Lance [name]      Medical Record Number: [id number]  Date of Birth: 1940/12/09 Sex: Male         Room/Bed: 4W08C/4W08C-01 Payor Info: Payor: MEDICARE / Plan: MEDICARE PART A AND B / Product Type: *No Product type* /    Admitting Diagnosis: L3 compression fx  Admit Date/Time:  12/27/2013  5:14 PM Admission Comments: No comment available   Primary Diagnosis:  <principal problem not specified> Principal Problem: <principal problem not specified>  Patient Active Problem List   Diagnosis Date Noted  . Myelopathy of lumbar region 12/27/2013  . Malnutrition of moderate degree 12/25/2013  . Closed lumbar vertebral fracture 12/22/2013  . Spinal stenosis of lumbar region 12/22/2013  . Lumbar vertebral fracture 12/22/2013  . Degenerative arthritis of hip 05/08/2011    Expected Discharge Date: Expected Discharge Date: 01/06/14  Team Members Present: Physician leading conference: Dr. Faith [name] Social Worker Present: Lance Jupiter, LCSW;Lance Anzel Kearse, LCSW Nurse Present: Lance Purl, RN PT Present: Lance [name], Lance [name], PT OT Present: Lance [name], OT;Lance [name], OT SLP Present: Lance [name], SLP PPS Coordinator present : Lance Duck, RN, CRRN     Current Status/Progress Goal Weekly Team Focus  Medical   L3 compression fx with radiculopathy, amyloid kidney disease, cognitive deficit  improve pain and functional mobility  pain control, renal work up.    Bowel/Bladder   Continent of bowel a. 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  Assess bbowel and bladder q shift.   Swallow/Nutrition/ Hydration             ADL's   overall supervision>min assist  downgrading goals > an overall supervision level  ADL retraining, functional mobility, functional transfers, dynamic  standing balance/tolerance/endurance, overall activity tolerance/endurance   Mobility   min A overall  mod I overall  endurance, functional strengthening, dynamic balance, stairs, gait training, d/c planning   Communication             Safety/Cognition/ Behavioral Observations            Pain   Tramadol 25 mg q 6 hr PRN, Robaxin 500 mg q 6 hrs/Tylenol 650 mg q 4 hrs. PRN .c/o left leg pain  Pain level 3 or less on a scale of 0-10  Monitor any onset of pain., Assess effectivenness of pain meds given and modify as needed   Skin   Redness to scrotum and sacrum barrier cream applied., blister on mouth, oral care and orajel applied,back inciision with steristrips.  No new skin breakdown/infection during rehab stay.  Assess skin q shift    Rehab Goals Patient on target to meet rehab goals: Yes Rehab Goals Revised: Some of pt's goals have been downgraded to supervision due to pt not remembering to don brace and has poor carryover. *See Care Plan and progress notes for long and short-term goals.  Barriers to Discharge: cognition, pain, brace wear/safety    Possible Resolutions to Barriers:  supervison goals    Discharge Planning/Teaching Needs:  Pt plans to go home with his spouse who will provide 24/7 supervision.  [name] has been present for many therapies already.   Team Discussion:  Pt has a lot of anxiety, but seems to be doing better cognitively.  Pt continues to have a lot of pain and managing this is  complicated by kidney condition and not wanting to impact pt's cognition.  Pt has difficulty with carryover from therapy sessions and some goals will be downgraded to supervision as a result.  Pt will need home health for f/u and rollator for home use.  Revisions to Treatment Plan:  Some goals downgraded to supervision.   Continued Need for Acute Rehabilitation Level of Care: The patient requires daily medical management by a physician with specialized training in physical medicine and  rehabilitation for the following conditions: Daily direction of a multidisciplinary physical rehabilitation program to ensure safe treatment while eliciting the highest outcome that is of practical value to the patient.: Yes Daily medical management of patient stability for increased activity during participation in an intensive rehabilitation regime.: Yes Daily analysis of laboratory values and/or radiology reports with any subsequent need for medication adjustment of medical intervention for : Post surgical problems;Neurological problems  Lance [name], [place] Deck 01/04/2014, 10:15 AM

## 2014-01-04 NOTE — Progress Notes (Signed)
Social Work Patient ID: Carson Myrtle, male   DOB: 03-Jun-1940, 73 y.o.   MRN: [id number]  Inpatient RehabilitationTeam Conference and Plan of Care Update Date: 01/02/2014   Time: 2:25 PM     Patient Name: Frederick Marro       Medical Record Number: [id number]   Date of Birth: 1941-01-14 Sex: Male         Room/Bed: 4W08C/4W08C-01 Payor Info: Payor: MEDICARE / Plan: MEDICARE PART A AND B / Product Type: *No Product type* /   Admitting Diagnosis: L3 compression fx   Admit Date/Time:  12/27/2013  5:14 PM Admission Comments: No comment available   Primary Diagnosis:  <principal problem not specified> Principal Problem: <principal problem not specified>    Patient Active Problem List     Diagnosis  Date Noted   .  Myelopathy of lumbar region  12/27/2013   .  Malnutrition of moderate degree  12/25/2013   .  Closed lumbar vertebral fracture  12/22/2013   .  Spinal stenosis of lumbar region  12/22/2013   .  Lumbar vertebral fracture  12/22/2013   .  Degenerative arthritis of hip  05/08/2011     Expected Discharge Date: Expected Discharge Date: 01/06/14  Team Members Present: Physician leading conference: Dr. Faith Rogue Social Worker Present: Amada Jupiter, LCSW;Jenny Zakaria Sedor, LCSW Nurse Present: Carlean Purl, RN PT Present: Karolee Stamps, Talitha Givens, PT OT Present: Donzetta Kohut, OT;Lynden Carrithers Katrinka Blazing, OT SLP Present: Fae Pippin, SLP PPS Coordinator present : Tora Duck, RN, CRRN        Current Status/Progress  Goal  Weekly Team Focus   Medical     L3 compression fx with radiculopathy, amyloid kidney disease, cognitive deficit  improve pain and functional mobility  pain control, renal work up.    Bowel/Bladder     Continent of bowel a. 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  Assess bbowel and bladder q shift.   Swallow/Nutrition/ Hydration            ADL's     overall  supervision>min assist  downgrading goals > an overall supervision level  ADL retraining, functional mobility, functional transfers, dynamic standing balance/tolerance/endurance, overall activity tolerance/endurance   Mobility     min A overall  mod I overall  endurance, functional strengthening, dynamic balance, stairs, gait training, d/c planning   Communication            Safety/Cognition/ Behavioral Observations           Pain     Tramadol 25 mg q 6 hr PRN, Robaxin 500 mg q 6 hrs/Tylenol 650 mg q 4 hrs. PRN .c/o left leg pain  Pain level 3 or less on a scale of 0-10  Monitor any onset of pain., Assess effectivenness of pain meds given and modify as needed   Skin     Redness to scrotum and sacrum barrier cream applied., blister on mouth, oral care and orajel applied,back inciision with steristrips.  No new skin breakdown/infection during rehab stay.  Assess skin q shift    Rehab Goals Patient on target to meet rehab goals: Yes Rehab Goals Revised: Some of pt's goals have been downgraded to supervision due to pt not remembering to don brace and has poor carryover. *See Care Plan and progress notes for long and short-term goals.    Barriers to Discharge:  cognition, pain, brace wear/safety      Possible  Resolutions to Barriers:    supervison goals      Discharge Planning/Teaching Needs:    Pt plans to go home with his spouse who will provide 24/7 supervision.   [name] has been present for many therapies already.    Team Discussion:    Pt has a lot of anxiety, but seems to be doing better cognitively.  Pt continues to have a lot of pain and managing this is complicated by kidney condition and not wanting to impact pt's cognition.  Pt has difficulty with carryover from therapy sessions and some goals will be downgraded to supervision as a result.  Pt will need home health for f/u and rollator for home use.   Revisions to Treatment Plan:    Some goals downgraded to supervision.     Continued Need for Acute Rehabilitation Level of Care: The patient requires daily medical management by a physician with specialized training in physical medicine and rehabilitation for the following conditions: Daily direction of a multidisciplinary physical rehabilitation program to ensure safe treatment while eliciting the highest outcome that is of practical value to the patient.: Yes Daily medical management of patient stability for increased activity during participation in an intensive rehabilitation regime.: Yes Daily analysis of laboratory values and/or radiology reports with any subsequent need for medication adjustment of medical intervention for : Post surgical problems;Neurological problems  Jadalynn Burr, [place] [facility] 01/04/2014, 10:15 AM

## 2014-01-04 NOTE — Progress Notes (Signed)
Recreational Therapy Discharge Summary Patient Details  Name: Lance [name] MRN: [id number] Date of Birth: Jan 07, 1941 Today's Date: 01/04/2014  Long term goals set: 1  Long term goals met: 1  Comments on progress toward goals: Pt has made excellent progress toward goal and is ready for discharge home at supervision level.  Pt educated on importance of staying engaged in activities of choice and potential ways to modify activities for safety & energy conservation.  Pt stated understanding. Reasons for discharge: discharge from hospital  Patient/family agrees with progress made and goals achieved: Yes  Lance [name] 01/04/2014, 4:04 PM

## 2014-01-04 NOTE — Progress Notes (Signed)
Occupational Therapy Session Note  Patient Details  Name: Lance [name] MRN: [id number] Date of Birth: 04/24/1941  Today's Date: 01/04/2014 OT Individual Time: 1500-1540 OT Individual Time Calculation (min): 40 min   Short Term Goals: Week 1:  OT Short Term Goal 1 (Week 1): Patient will complete toilet transfer at supervision level OT Short Term Goal 2 (Week 1): Patient will complete LB dressing with min assist with use of AE, prn OT Short Term Goal 3 (Week 1): Patient will complete 1 grooming task in standing with SBA  Skilled Therapeutic Interventions/Progress Updates:  Patient resting in w/c upon arrival with SO, [name], at his side.  Patient ambulated to bathroom with supervision using Rolator and vcs to lock breaks and allow time for foley catheter bag to be positioned before walking away.  Focused session caregiver education, walker safety, adhering to back precautions, grooming at sink and patient requested to perform seated on Rolator due to fatigue.  Patient requested back to bed to rest and agreed to get out of bed for evening meal.  Patient's caregiver observed and asked questions.  Reviewed need for caregiver to return demonstrate assisting patient to include cues for no BAT.    Therapy Documentation Precautions:  Precautions Precautions: Back;Fall Precaution Comments: pt only able to report 1/3 precautions, reviewed verbally Required Braces or Orthoses: Spinal Brace Spinal Brace: Lumbar corset;Applied in sitting position Restrictions Weight Bearing Restrictions: No Pain: No report of pain See FIM for current functional status  Therapy/Group: Individual Therapy  Chrisanna Mishra 01/04/2014, 5:11 PM

## 2014-01-04 NOTE — Progress Notes (Signed)
Chart and note reviewed. Agree with note.  Kimberly Harris, RD, LDN, CNSC Pager [phone] After Hours Pager [phone]   

## 2014-01-04 NOTE — Progress Notes (Signed)
Physical Therapy Session Note  Patient Details  Name: Lance [name] MRN: [id number] Date of Birth: October 21, 1940  Today's Date: 01/04/2014 PT Individual Time: 0830-0930 PT Individual Time Calculation (min): 60 min   Short Term Goals: Week 1:  PT Short Term Goal 1 (Week 1): patient will perform bed mobility with rails and supervision PT Short Term Goal 2 (Week 1): patient will perform sit to stand and stand pivot transfer bed <> wheelchair with supervsion PT Short Term Goal 3 (Week 1): Patient will propel wheelchair 100 feet with supervision in comtrolled environment PT Short Term Goal 4 (Week 1): Patient will ambuate 75 feet with rolling walker and min steady assist in controlled environment  Skilled Therapeutic Interventions/Progress Updates:   Session focused on functional bed mobility (mod I for bed mobility; required S cueing for donning brace), various transfers with rollator from multiple furniture surfaces (S except min A from very low couch), gait with RW x 60' (limited due to pain in RLE) and gait on uneven surfaces outdoors with close S (including going down incline), and education on adaptations for home mobility and activities to maintain quality of life. Pt expresses concern in regards to his future due to medical status and encouraged pt to write down questions for meeting later today with doctors. Emotional support provided throughout and pt very thankful for opportunity to go outdoors this morning.   Therapy Documentation Precautions:  Precautions Precautions: Back;Fall Precaution Comments: pt only able to report 1/3 precautions, reviewed verbally Required Braces or Orthoses: Spinal Brace Spinal Brace: Lumbar corset;Applied in sitting position Restrictions Weight Bearing Restrictions: No  Pain:  Premedicated for pain in RLE. Rest breaks as needed.  See FIM for current functional status  Therapy/Group: Individual Therapy and Co-Treatment with Recreational  Therapy.  Karolee Stamps [facility] 01/04/2014, 10:30 AM

## 2014-01-04 NOTE — Progress Notes (Addendum)
Recreational Therapy Session Note  Patient Details  Name: Lance [name] MRN: [id number] Date of Birth: 1941/02/17 Today's Date: 01/04/2014  Pain: intermittent c/o of LLE pain, premedicated Skilled Therapeutic Interventions/Progress Updates: Session focused on activity tolerance, ambulation with rolator, w/c mobility, furniture transfers, community ambulation, discharge planning.  Pt ambulated with rolator with supervision on level surfaces & close supervision on unlevel surfaces including small carpeted incline in [place].  Discussion with pt about use of leisure time, activity adaptations/modifications & overall safety with activity/mobility.  Encouraged pt to ask questions to gain information & reduce anxiety about his diagnosis & treatment course.  Provided pt with his notepad & pen to list questions/concerns to address with physicians today during their meeting at noon.  Therapy/Group: Co-Treatment   Neha Waight 01/04/2014, 9:56 AM

## 2014-01-04 NOTE — Progress Notes (Signed)
Occupational Therapy Session Note  Patient Details  Name: Lance [name] MRN: [id number] Date of Birth: Apr 01, 1941  Today's Date: 01/04/2014 OT Individual Time: 7829-5621 OT Individual Time Calculation (min): 95 min   Short Term Goals: Week 1:  OT Short Term Goal 1 (Week 1): Patient will complete toilet transfer at supervision level OT Short Term Goal 2 (Week 1): Patient will complete LB dressing with min assist with use of AE, prn OT Short Term Goal 3 (Week 1): Patient will complete 1 grooming task in standing with SBA  Skilled Therapeutic Interventions/Progress Updates:  Patient received seated in w/c with 8/10 complaints of pain in RLE, RN aware. Patient stood with rollator and ambulated into bathroom for shower stall transfer. Patient worked on doffing of clothes using reach prn. Patient required min verbal cues for lumbar corset wearing orders and importance of keeping brace on during all standing and mobility tasks. Patient completed UB/LB bathing tasks with supervision in seated position secondary to lumbar corset wearing orders. Patient dried, donned shirt with set-up and lumbar corset with assist from therapist. Patient ambulated > w/c set-up in room and completed LB dressing using reacher & sock aid in sit<>stand position to pull pants to waist; patient supervision for this. Patient's partner, Lance [name] present after ADL routine. Lance [name] expressed his concern with patient's d/c plan for 9/05. Discussed patient's progress with Lance [name] and expressed patient's need for 24/7 supervision, but no hands on needed at this time. Also expressed pain, strength, endurance, and memory being patient's limiting factors and reason 24/7 supervision. Recommending HHOT, discussed this with Lance [name]. Therapist encouraged Lance [name] and patient to ambulate <> BR using rollator for toilet transfer. Therapist then educated Lance [name] on safe & effective shower stall transfer using shower seat and rollator. [facility] present to drop  off patient's personal rollator. When therapist had patient attempt to stand with rollator brakes did not work properly. Rep took rollator back and Lance [name] discussed possibly looking for rollator elsewhere; notified SW and PT of this. At end of session, left patient seated in w/c with Lance [name] present and all needs within reach.   Precautions:  Precautions Precautions: Back;Fall Precaution Comments: pt only able to report 1/3 precautions, reviewed verbally Required Braces or Orthoses: Spinal Brace Spinal Brace: Lumbar corset;Applied in sitting position Restrictions Weight Bearing Restrictions: No  See FIM for current functional status  Therapy/Group: Individual Therapy  Holley Wirt 01/04/2014, 12:37 PM

## 2014-01-04 NOTE — Progress Notes (Signed)
[facility] PHYSICAL MEDICINE & REHABILITATION     PROGRESS NOTE    Subjective/Complaints: Pelvis sore today after bx yesterday. Right leg remains sore as well.   Objective: Vital Signs: Blood pressure 107/61, pulse 101, temperature 99 F (37.2 C), temperature source Oral, resp. rate 17, height 5' 8"  (1.727 m), weight 61.4 kg (135 lb 5.8 oz), SpO2 95.00%. 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  EXAM: CT GUIDED BONE MARROW ASPIRATES AND BIOPSY  Physician: Stephan Minister. Anselm Pancoast, MD  MEDICATIONS: 1 mg versed, 25 mcg fentanyl. A radiology nurse monitored the patient for moderate sedation.  ANESTHESIA/SEDATION: Sedation time: 11 min  PROCEDURE: Informed consent was obtained for a bone marrow biopsy. The patient was placed on his left side. Images of the pelvis were obtained. The left side of back was prepped and draped in sterile fashion. The skin and left posterior iliac bone were anesthetized with 1% lidocaine. 11 gauge bone needle was directed into the left iliac bone with CT guidance. Two aspirates and 1 core biopsy obtained.  FINDINGS: Bone needle directed into the posterior left iliac bone.  COMPLICATIONS: None  IMPRESSION: CT guided bone marrow aspirates and core biopsy.   Electronically Signed   By: Markus Daft M.D.   On: 01/03/2014 13:13    Recent Labs  01/01/14 1150 01/03/14 1105  WBC 10.5 11.3*  HGB 10.0* 8.9*  HCT 31.1* 27.5*  PLT 555* 462*    Recent Labs  01/02/14 0441  NA 138  K 4.4  CL 105  GLUCOSE 87  BUN 66*  CREATININE 3.31*  CALCIUM 8.8   CBG (last 3)  No results found for this basename: GLUCAP,  in the last 72 hours  Wt Readings from Last 3 Encounters:  01/04/14 61.4 kg (135 lb 5.8 oz)  12/22/13 62.5 kg (137 lb 12.6 oz)  12/22/13 62.5 kg (137 lb 12.6 oz)    Physical Exam:  Constitutional: He is oriented to person, place, and time. He appears well-developed and well-nourished.  HENT: oral  mucosa pink/moist  Head: Normocephalic and atraumatic.  Eyes: Conjunctivae are normal. Pupils are equal, round, and reactive to light.  Neck: Normal range of motion. Neck supple.  Cardiovascular: Normal rate and regular rhythm.  Respiratory: Effort normal and breath sounds normal. No respiratory distress. He has no wheezes.  GI: He exhibits distension. Bowel sounds are increased. There is no tenderness.  Neurological: He is alert and oriented to person, place, and time.  Speech dysarthric. Follows commands without difficulty. UES: RUE: deltoid 5/5, bicep 5/5, tricep 5/5, wrist ext 5/5, hand intrinsics 5/5 LUE: deltoid 5/5, bicep 5/5, triceps 5/5, wrist ext 5/5, hand instrincs 5/5 RLE: Hip 3-/5 pain, Isolated KE 3 to 3+/5. HAD 3+/5. Ankle 4+/5 LLE: Hip 3+, KE 4/5, ankle 4/5.  Sensory changes over the anterior-lateral thigh to right knee. DTR's remain 1+  Skin: Skin is warm and dry.surgical site intact  M/S: back tender to touch and with proximal LE movement  Psychiatric: He has a normal mood and affect. His behavior is normal. Judgment and thought content normal Urology: catheter in place  Assessment/Plan: 1. Functional deficits secondary to L3 fx and associated radiculopathy which require 3+ hours per day of interdisciplinary therapy in a comprehensive inpatient rehab setting. Physiatrist is providing close team supervision and 24 hour management of active medical problems listed below. Physiatrist and rehab team continue to assess barriers to discharge/monitor patient progress toward  functional and medical goals.  On track to leave this weekend pending renal and onc recs  FIM: FIM - Bathing Bathing Steps Patient Completed: Chest;Right Arm;Left Arm;Abdomen;Front perineal area;Buttocks;Right upper leg;Left upper leg;Right lower leg (including foot);Left lower leg (including foot) Bathing: 5: Supervision: Safety issues/verbal cues (using LH sponge)  FIM - Upper Body  Dressing/Undressing Upper body dressing/undressing steps patient completed: Thread/unthread right sleeve of pullover shirt/dresss;Thread/unthread left sleeve of pullover shirt/dress;Put head through opening of pull over shirt/dress;Pull shirt over trunk Upper body dressing/undressing: 5: Set-up assist to: Obtain clothing/put away (and to don/doff brace) FIM - Lower Body Dressing/Undressing Lower body dressing/undressing steps patient completed: Thread/unthread right underwear leg;Thread/unthread left underwear leg;Pull underwear up/down;Thread/unthread right pants leg;Thread/unthread left pants leg;Pull pants up/down Lower body dressing/undressing: 3: Mod-Patient completed 50-74% of tasks (using reacher to assist')  FIM - Toileting Toileting steps completed by patient: Adjust clothing prior to toileting;Performs perineal hygiene;Adjust clothing after toileting Toileting Assistive Devices: Grab bar or rail for support;Toilet Aid/prosthesis/orthosis Toileting: 0: Activity did not occur  FIM - Air cabin crew Transfers: 0-Activity did not occur  FIM - Control and instrumentation engineer Devices: Walker;Orthosis;Arm rests (rollator) Bed/Chair Transfer: 6: Supine > Sit: No assist;5: Sit > Supine: Supervision (verbal cues/safety issues);4: Chair or W/C > Bed: Min A (steadying Pt. > 75%);4: Bed > Chair or W/C: Min A (steadying Pt. > 75%)  FIM - Locomotion: Wheelchair Distance: 150 Locomotion: Wheelchair: 5: Travels 150 ft or more: maneuvers on rugs and over door sills with supervision, cueing or coaxing FIM - Locomotion: Ambulation Locomotion: Ambulation Assistive Devices: Other (comment);Orthosis (rollator) Ambulation/Gait Assistance: 4: Min guard Locomotion: Ambulation: 2: Travels 50 - 149 ft with minimal assistance (Pt.>75%)  Comprehension Comprehension Mode: Auditory Comprehension: 5-Follows basic conversation/direction: With no assist  Expression Expression Mode:  Verbal Expression: 4-Expresses basic 75 - 89% of the time/requires cueing 10 - 24% of the time. Needs helper to occlude trach/needs to repeat words.  Social Interaction Social Interaction Mode: Asleep Social Interaction: 4-Interacts appropriately 75 - 89% of the time - Needs redirection for appropriate language or to initiate interaction.  Problem Solving Problem Solving Mode: Asleep Problem Solving: 4-Solves basic 75 - 89% of the time/requires cueing 10 - 24% of the time  Memory Memory Mode: Asleep Memory: 3-Recognizes or recalls 50 - 74% of the time/requires cueing 25 - 49% of the time  Medical Problem List and Plan:  1. Functional deficits secondary to Rigth L3 radiculopathy , L3 compression fx s/p decompression/kyphoplasty  2. DVT Prophylaxis/Anticoagulation: Pharmaceutical: Lovenox  3. Pain Management: limited with meds due to renal status and narc tolerance  -  lidoderm patches over leg; heat, ice ok too 4. Anxiety disorder/Mood:improved but still anxious  -reiterated to him that improved anxiety control will assist in pain control 5. Neuropsych: This patient is capable of making decisions on her own behalf.  6. Skin/Wound Care: Pressure relief measures.   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 Constipation: Will need aggressive bowel program.  Miralax. SSE prn.  9. H/o BPH:  Flomax to 0.8 mg.   -coude cath    -Sees [facility] as outpt  -ua neg, culture neg 11. Odynophagia:    -biotene, MMW  for oral hygiene and moisture  LOS (Days) 8 A FACE TO FACE EVALUATION WAS  PERFORMED  Aliyah Abeyta T 01/04/2014 8:51 AM

## 2014-01-05 ENCOUNTER — Ambulatory Visit (HOSPITAL_COMMUNITY): Payer: Medicare Other

## 2014-01-05 ENCOUNTER — Inpatient Hospital Stay (HOSPITAL_COMMUNITY): Payer: Medicare Other

## 2014-01-05 ENCOUNTER — Telehealth: Payer: Self-pay

## 2014-01-05 ENCOUNTER — Encounter (HOSPITAL_COMMUNITY): Payer: Medicare Other | Admitting: Occupational Therapy

## 2014-01-05 ENCOUNTER — Inpatient Hospital Stay (HOSPITAL_COMMUNITY): Payer: Medicare Other | Admitting: Occupational Therapy

## 2014-01-05 DIAGNOSIS — I5031 Acute diastolic (congestive) heart failure: Secondary | ICD-10-CM

## 2014-01-05 DIAGNOSIS — I509 Heart failure, unspecified: Secondary | ICD-10-CM

## 2014-01-05 NOTE — Progress Notes (Signed)
Physical Therapy Session Note  Patient Details  Name: Lance [name] MRN: [id number] Date of Birth: Jun 19, 1940  Today's Date: 01/05/2014  Short Term Goals: Week 1:  PT Short Term Goal 1 (Week 1): patient will perform bed mobility with rails and supervision PT Short Term Goal 2 (Week 1): patient will perform sit to stand and stand pivot transfer bed <> wheelchair with supervsion PT Short Term Goal 3 (Week 1): Patient will propel wheelchair 100 feet with supervision in comtrolled environment PT Short Term Goal 4 (Week 1): Patient will ambuate 75 feet with rolling walker and min steady assist in controlled environment  Session #1: Time: 1030-1135 (65 min) Individual therapy; c/o pain in RLE ranging from 7/10-10/10 pain - RN notified for pain medication. Session focused on family education with pt and pt's partner, [name] focusing on functional transfers with rollator, simulated car transfer with rollator, stair negotiation for 1 step with rollator to simulate getting out to their terrace [name] assisted with lifting rollator onto step and pt able to perform up/down step with S), bed mobility from 29" bed height and supine <-> sit mod I with extra time due to increased pain in RLE this morning, gait with rollator x 150' with S (cues for posture and encouragement for distance as pt limited by pain), general safety recommendation and education on importance of continued mobility and HEP upon d/c. Pt and [name] both denied any questions at the time and felt confident with what was practiced this morning. Left with OT for next session in gym.   Session #2:  Time:1459-1528 (29 min) Individual therapy; c/o RLE pain still but premedicated. Session focused on functional bed mobility and transfer OOB including donning/doffing LSO EOB with S, gait to tolerance with rollator x 45' with S (limited due to RLE pain) for overall endurance and strengthening, and stair negotiation for community mobility training with  bilateral rails with steady A/close S x 5 steps.  Therapy Documentation Precautions:  Precautions Precautions: Back;Fall Precaution Comments: Recalled 3/3 back precautions; cues at times for adherence during functional mobility Required Braces or Orthoses: Spinal Brace Spinal Brace: Lumbar corset;Applied in sitting position Restrictions Weight Bearing Restrictions: No  See FIM for current functional status  Therapy/Group: Individual Therapy  Karolee Stamps [facility] 01/05/2014, 3:50 PM

## 2014-01-05 NOTE — Progress Notes (Signed)
Social Work Discharge Note  The overall goal for the admission was met for:   Discharge location: Yes - home  Length of Stay: Yes - 10 days  Discharge activity level: Yes - supervision  Home/community participation: Yes  Services provided included: MD, RD, PT, OT, RN, Pharmacy, Neuropsych and SW  Financial Services: Medicare and Private Insurance: Warden/ranger  Follow-up services arranged: Home Health: RN/PT/OT and Patient/Family request agency HH: [place]  Comments (or additional information): Pt will be followed by [place] for home health and  Spouse will obtain rollator of their choice at the medical supply store.  Patient/Family verbalized understanding of follow-up arrangements: Yes  Individual responsible for coordination of the follow-up plan: pt and spouse  Confirmed correct DME delivered: Trey Sailors 01/05/2014    Nemesis Rain[place], Silvestre Mesi

## 2014-01-05 NOTE — Progress Notes (Signed)
Physical Therapy Discharge Summary  Patient Details  Name: Lance [name] MRN: [id number] Date of Birth: [birth year]   Patient has met 8 of 8 long term goals due to improved activity tolerance, improved balance, increased strength and ability to compensate for deficits.  Patient to discharge at an ambulatory level Supervision using rollator and lumbar corset to be donned/doffed in seated postion.  Patient's care partner is independent to provide the necessary supervision assistance at discharge. Pt requires S for cueing for adherence to back precautions and to don LSO prior to OOB. Pain in RLE continues to be main limiting factor for pt's mobility at this time.  Reasons goals not met: n/a - all goals met at this time.  Recommendation:  Patient will benefit from ongoing skilled PT services in home health setting to continue to advance safe functional mobility, address ongoing impairments in gait, balance, endurance, strength, functional mobility, maintaining back precautions, pain, and minimize fall risk.  Equipment: rollator  Reasons for discharge: treatment goals met and discharge from hospital  Patient/family agrees with progress made and goals achieved: Yes  PT Discharge Precautions/Restrictions Precautions Precautions: Back;Fall Precaution Comments: Recalled 3/3 back precautions; cues at times for adherence during functional mobility Required Braces or Orthoses: Spinal Brace Spinal Brace: Lumbar corset;Applied in sitting position Restrictions Weight Bearing Restrictions: No  Cognition Overall Cognitive Status: Within Functional Limits for tasks assessed Orientation Level: Oriented X4 Safety/Judgment: Appears intact Sensation Coordination Gross Motor Movements are Fluid and Coordinated: Yes Motor  Motor Motor: Within Functional Limits (generalized deconditioning; pain often limiting strength)  Locomotion  Ambulation Ambulation/Gait Assistance: 5: Supervision with  Rollator Trunk/Postural Assessment  Cervical Assessment Cervical Assessment: Within Functional Limits Thoracic Assessment Thoracic Assessment: Exceptions to [facility] (kyphotic posture) Lumbar Assessment Lumbar Assessment: Exceptions to [facility] (back precautions) Postural Control Postural Control: Within Functional Limits  Balance Balance Balance Assessed: Yes Static Sitting Balance Static Sitting - Level of Assistance: 6: Modified independent (Device/Increase time) Dynamic Sitting Balance Dynamic Sitting - Level of Assistance: 6: Modified independent (Device/Increase time) Static Standing Balance Static Standing - Level of Assistance: 5: Stand by assistance Dynamic Standing Balance Dynamic Standing - Level of Assistance: 5: Stand by assistance Extremity Assessment  RUE Assessment RUE Assessment: Within Functional Limits LUE Assessment LUE Assessment: Within Functional Limits RLE Assessment RLE Assessment: Exceptions to [facility] RLE Strength RLE Overall Strength: Due to pain RLE Overall Strength Comments: grossly 3-/5 due to pain in leg with movement LLE Assessment LLE Assessment: Within Functional Limits (decreased muscular endurance)  See FIM for current functional status  [name] 01/05/2014, 3:54 PM

## 2014-01-05 NOTE — Consult Note (Signed)
Primary Physician: Primary Cardiologist:  New   HPI: Patient is a 73 yo who we are asked to see re CHF / amyloid Patient admitted on 8/21 to neurosurgery with pain in thigh  L3 radiculopathy on R. Has compression fx L3.  Underwent L2/3 decompression lamienectomy and vertebroplasty Has history of renal amyloid.  Undergoing further work up   Echo on 9/1 shows moderate LVH.  LVEF 60 to 65%        Past Medical History  Diagnosis Date  . GERD (gastroesophageal reflux disease)   . Arthritis     osteoarthritis  . Hypothyroidism   . Chronic kidney disease     bph,kidney bx,renal insuff.  . Anxiety   . Weight decrease     20lbs    Medications Prior to Admission  Medication Sig Dispense Refill  . docusate sodium (COLACE) 100 MG capsule Take 100 mg by mouth daily.      . famotidine (PEPCID AC) 10 MG chewable tablet Chew 10 mg by mouth daily.      Marland Kitchen gabapentin (NEURONTIN) 300 MG capsule Take 300 mg by mouth 3 (three) times daily.      Marland Kitchen HYDROcodone-acetaminophen (NORCO/VICODIN) 5-325 MG per tablet Take 1 tablet by mouth every 6 (six) hours as needed for moderate pain (every 8 hrs).      Marland Kitchen levothyroxine (SYNTHROID) 50 MCG tablet Take 50 mcg by mouth daily.      . Menthol, Topical Analgesic, (BIOFREEZE EX) Apply 1 application topically 3 (three) times daily as needed (for pain).      . pravastatin (PRAVACHOL) 20 MG tablet Take 20 mg by mouth at bedtime.       . tamsulosin (FLOMAX) 0.4 MG CAPS capsule Take 0.4 mg by mouth daily.         Marland Kitchen antiseptic oral rinse  15 mL Mouth Rinse BID  . enoxaparin (LOVENOX) injection  30 mg Subcutaneous Q24H  . famotidine  10 mg Oral Daily  . feeding supplement (NEPRO CARB STEADY)  237 mL Oral TID BM  . gabapentin  200 mg Oral TID  . levothyroxine  50 mcg Oral QAC breakfast  . lidocaine  2 patch Transdermal Q24H  . magic mouthwash  5 mL Oral QID  . multivitamin  1 tablet Oral QHS  . pantoprazole  40 mg Oral BID  . polyethylene glycol  17 g Oral  Daily  . simvastatin  10 mg Oral q1800  . tamsulosin  0.4 mg Oral BID    Infusions:    No Known Allergies  History   Social History  . Marital Status: Single    Spouse Name: N/A    Number of Children: N/A  . Years of Education: N/A   Occupational History  . Not on file.   Social History Main Topics  . Smoking status: Never Smoker   . Smokeless tobacco: Never Used  . Alcohol Use: Yes     Comment: socially  . Drug Use: No  . Sexual Activity: Not on file   Other Topics Concern  . Not on file   Social History Narrative  . No narrative on file    History reviewed. No pertinent family history.  REVIEW OF SYSTEMS:  All systems reviewed  Negative to the above problem except as noted above.    PHYSICAL EXAM: Filed Vitals:   01/05/14 0518  BP: 100/58  Pulse: 93  Temp: 98.4 F (36.9 C)  Resp: 18     Intake/Output Summary (Last  24 hours) at 01/05/14 1031 Last data filed at 01/05/14 1000  Gross per 24 hour  Intake    720 ml  Output   2900 ml  Net  -2180 ml    General:  Well appearing. No respiratory difficulty HEENT: normal Neck: supple. no JVD. Carotids 2+ bilat; no bruits. No lymphadenopathy or thryomegaly appreciated. Cor: PMI nondisplaced. Regular rate & rhythm. No rubs, gallops or murmurs. Lungs: clear Abdomen: soft, nontender, nondistended. No hepatosplenomegaly. No bruits or masses. Good bowel sounds. Extremities: no cyanosis, clubbing, rash  Tr to 1+ edema Neuro: alert & oriented x 3, cranial nerves grossly intact. moves all 4 extremities w/o difficulty. Affect pleasant.  ECG: SR 83  Anterior MI  Inferior MI    Results for orders placed during the hospital encounter of 12/27/13 (from the past 24 hour(s))  RENAL FUNCTION PANEL     Status: Abnormal   Collection Time    01/04/14  1:00 PM      Result Value Ref Range   Sodium 140  137 - 147 mEq/L   Potassium 3.9  3.7 - 5.3 mEq/L   Chloride 103  96 - 112 mEq/L   CO2 22  19 - 32 mEq/L   Glucose, Bld  141 (*) 70 - 99 mg/dL   BUN 59 (*) 6 - 23 mg/dL   Creatinine, Ser 1.61 (*) 0.50 - 1.35 mg/dL   Calcium 9.7  8.4 - 09.6 mg/dL   Phosphorus 3.7  2.3 - 4.6 mg/dL   Albumin 2.5 (*) 3.5 - 5.2 g/dL   GFR calc non Af Amer 19 (*) >90 mL/min   GFR calc Af Amer 22 (*) >90 mL/min   Anion gap 15  5 - 15   No results found.   ASSESSMENT:  Lance [name] is a 73 yo who with history of renal amyloid.  Has diuresed by I/O and wt.  Still with some volume increase on exam. Renal following   I have reviewed echo  There is some mild to moderate LVH  Myocardium is not diagnostic for amyloid infiltration.  LVEF is normal to vigorous.   No further recommendations  Use diuretics as needed, with close follow up of renal functon  WIll make sure he has outpatient f/u.

## 2014-01-05 NOTE — Progress Notes (Signed)
Per Dr. Tenny Craw - signing off. I have left a message on our office's scheduling voicemail requesting a follow-up appointment with her in 3 weeks, and our office will call the patient with this appointment. Dayna Dunn PA-C

## 2014-01-05 NOTE — Discharge Instructions (Signed)
Inpatient Rehab Discharge Instructions  [name] Discharge date and time:  01/06/14  Activities/Precautions/ Functional Status: Activity: no lifting, driving, or strenuous exercise till cleared by MD Diet: renal diet Wound Care: keep wound clean and dry  Functional status:  ___ No restrictions     ___ Walk up steps independently _X__ 24/7 supervision/assistance   ___ Walk up steps with assistance ___ Intermittent supervision/assistance  ___ Bathe/dress independently ___ Walk with walker     ___ Bathe/dress with assistance ___ Walk Independently    ___ Shower independently ___ Walk with assistance    ___ Shower with assistance _X__ No alcohol     ___ Return to work/school ________  COMMUNITY REFERRALS UPON DISCHARGE:   Home Health:   PT     OT     RN     Agency:  [facility] Phone:  [phone] Medical Equipment:  Prescription given for you to go to a medical supply store to obtain the rollator of your choice Other:  Dr. Leavy Cella   [phone]                [facility]  GENERAL COMMUNITY RESOURCES FOR PATIENT/FAMILY: Support Groups: Caregiver Support:  Special Instructions: 1. No bending, twisting or arching. Do not lift anything over 5 lbs.   My questions have been answered and I understand these instructions. I will adhere to these goals and the provided educational materials after my discharge from the hospital.  Patient/Caregiver Signature _______________________________ Date __________  Clinician Signature _______________________________________ Date __________  Please bring this form and your medication list with you to all your follow-up doctor's appointments.

## 2014-01-05 NOTE — Progress Notes (Addendum)
[facility]     PROGRESS NOTE    Subjective/Complaints: Sore, frustrated by pain in leg, having cramps in leg at times   Objective: Vital Signs: Blood pressure 100/58, pulse 93, temperature 98.4 F (36.9 C), temperature source Oral, resp. rate 18, height 5' 8"  (1.727 m), weight 61 kg (134 lb 7.7 oz), SpO2 98.00%. 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  EXAM: CT GUIDED BONE MARROW ASPIRATES AND BIOPSY  Physician: Stephan Minister. Anselm Pancoast, MD  MEDICATIONS: 1 mg versed, 25 mcg fentanyl. A radiology nurse monitored the patient for moderate sedation.  ANESTHESIA/SEDATION: Sedation time: 11 min  PROCEDURE: Informed consent was obtained for a bone marrow biopsy. The patient was placed on his left side. Images of the pelvis were obtained. The left side of back was prepped and draped in sterile fashion. The skin and left posterior iliac bone were anesthetized with 1% lidocaine. 11 gauge bone needle was directed into the left iliac bone with CT guidance. Two aspirates and 1 core biopsy obtained.  FINDINGS: Bone needle directed into the posterior left iliac bone.  COMPLICATIONS: None  IMPRESSION: CT guided bone marrow aspirates and core biopsy.   Electronically Signed   By: Markus Daft M.D.   On: 01/03/2014 13:13    Recent Labs  01/03/14 1105  WBC 11.3*  HGB 8.9*  HCT 27.5*  PLT 462*    Recent Labs  01/04/14 1300  NA 140  K 3.9  CL 103  GLUCOSE 141*  BUN 59*  CREATININE 3.12*  CALCIUM 9.7   CBG (last 3)  No results found for this basename: GLUCAP,  in the last 72 hours  Wt Readings from Last 3 Encounters:  01/05/14 61 kg (134 lb 7.7 oz)  12/22/13 62.5 kg (137 lb 12.6 oz)  12/22/13 62.5 kg (137 lb 12.6 oz)    Physical Exam:  Constitutional: He is oriented to person, place, and time. He appears well-developed and well-nourished.  HENT: oral mucosa pink/moist  Head: Normocephalic and  atraumatic.  Eyes: Conjunctivae are normal. Pupils are equal, round, and reactive to light.  Neck: Normal range of motion. Neck supple.  Cardiovascular: Normal rate and regular rhythm.  Respiratory: Effort normal and breath sounds normal. No respiratory distress. He has no wheezes.  GI: He exhibits distension. Bowel sounds are increased. There is no tenderness.  Neurological: He is alert and oriented to person, place, and time.  Speech dysarthric. Follows commands without difficulty. UES: RUE: deltoid 5/5, bicep 5/5, tricep 5/5, wrist ext 5/5, hand intrinsics 5/5 LUE: deltoid 5/5, bicep 5/5, triceps 5/5, wrist ext 5/5, hand instrincs 5/5 RLE: Hip 3-/5 pain, Isolated KE 3 to 3+/5. HAD 3+/5. Ankle 4+/5 LLE: Hip 3+, KE 4/5, ankle 4/5.  Sensory changes over the anterior-lateral thigh to right knee. DTR's remain 1+  Skin: Skin is warm and dry.surgical site intact  M/S: back tender to touch and with proximal LE movement  Psychiatric: He has a normal mood and affect. His behavior is normal. Judgment and thought content normal Urology: catheter in place  Assessment/Plan: 1. Functional deficits secondary to L3 fx and associated radiculopathy which require 3+ hours per day of interdisciplinary therapy in a comprehensive inpatient rehab setting. Physiatrist is providing close team supervision and 24 hour management of active medical problems listed below. Physiatrist and rehab team continue to assess barriers to discharge/monitor patient progress toward functional and medical goals.  DC  tomorrow. Follow up with nephrology/onc regarding follow up plans  FIM: FIM - Bathing Bathing Steps Patient Completed: Chest;Right Arm;Left Arm;Abdomen;Front perineal area;Buttocks;Right upper leg;Left upper leg;Right lower leg (including foot);Left lower leg (including foot) Bathing: 5: Supervision: Safety issues/verbal cues (using LH sponge, and min verbal cues for safety)  FIM - Upper Body  Dressing/Undressing Upper body dressing/undressing steps patient completed: Thread/unthread right sleeve of pullover shirt/dresss;Thread/unthread left sleeve of pullover shirt/dress;Put head through opening of pull over shirt/dress;Pull shirt over trunk Upper body dressing/undressing: 5: Set-up assist to: Obtain clothing/put away (and don/doff brace) FIM - Lower Body Dressing/Undressing Lower body dressing/undressing steps patient completed: Thread/unthread right underwear leg;Thread/unthread left underwear leg;Pull underwear up/down;Thread/unthread right pants leg;Thread/unthread left pants leg;Pull pants up/down;Don/Doff right sock;Don/Doff left sock Lower body dressing/undressing: 5: Supervision: Safety issues/verbal cues (using reacher and sock aid)  FIM - Toileting Toileting steps completed by patient: Adjust clothing prior to toileting;Performs perineal hygiene;Adjust clothing after toileting Toileting Assistive Devices: Grab bar or rail for support;Toilet Aid/prosthesis/orthosis Toileting: 5: Supervision: Safety issues/verbal cues  FIM - Radio producer Devices: Elevated toilet seat;Grab bars;Walker Toilet Transfers: 5-To toilet/BSC: Supervision (verbal cues/safety issues);5-From toilet/BSC: Supervision (verbal cues/safety issues)  FIM - Control and instrumentation engineer Devices: Walker;Orthosis;Arm rests (rollator) Bed/Chair Transfer: 6: Supine > Sit: No assist;5: Bed > Chair or W/C: Supervision (verbal cues/safety issues)  FIM - Locomotion: Wheelchair Distance: 150 Locomotion: Wheelchair: 6: Travels 150 ft or more, turns around, maneuvers to table, bed or toilet, negotiates 3% grade: maneuvers on rugs and over door sills independently FIM - Locomotion: Ambulation Locomotion: Ambulation Assistive Devices: Other (comment);Orthosis (rollator) Ambulation/Gait Assistance: 4: Min guard Locomotion: Ambulation: 2: Travels 50 - 149 ft with  supervision/safety issues  Comprehension Comprehension Mode: Auditory Comprehension: 5-Understands complex 90% of the time/Cues < 10% of the time  Expression Expression Mode: Verbal Expression: 5-Expresses complex 90% of the time/cues < 10% of the time  Social Interaction Social Interaction Mode: Asleep Social Interaction: 5-Interacts appropriately 90% of the time - Needs monitoring or encouragement for participation or interaction.  Problem Solving Problem Solving Mode: Asleep Problem Solving: 4-Solves basic 75 - 89% of the time/requires cueing 10 - 24% of the time  Memory Memory Mode: Asleep Memory: 3-Recognizes or recalls 50 - 74% of the time/requires cueing 25 - 49% of the time  Medical Problem List and Plan:  1. Functional deficits secondary to Rigth L3 radiculopathy , L3 compression fx s/p decompression/kyphoplasty  2. DVT Prophylaxis/Anticoagulation: Pharmaceutical: Lovenox  3. Pain Management: limited with meds due to renal status and narc tolerance  -  lidoderm patches over leg; heat, ice ok too  -anxiety mgt 4. Anxiety disorder/Mood:improved but still anxious  -reiterated to him that improved anxiety control will assist in pain control 5. Neuropsych: This patient is capable of making decisions on her own behalf.  6. Skin/Wound Care: Pressure relief measures.   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 Constipation: Will need aggressive bowel program.  Miralax. SSE prn.  9. H/o BPH:  Flomax to 0.8 mg.   -indwelling cath----continue at home  -follow up with [facility] as outpt  -ua neg, culture neg 11. Odynophagia:    -biotene, MMW  for oral hygiene and moisture  LOS (Days) 9 A FACE  TO FACE EVALUATION WAS PERFORMED  SWARTZ,ZACHARY  T 01/05/2014 7:42 AM

## 2014-01-05 NOTE — Telephone Encounter (Signed)
Returned call to Elam Dutch, patient's partner.  He reports that pt will be discharged form rehab tomorrow and wants to know BM Bx results.  I let him know Bx results not available yet.  He was very upset and crying on the phone.  Concerned "he will not make it until Sept 17".  Thought he was seeing Dr. Arbutus Ped.  I clarified that Mr. Mcgann will be seeing Dr. Pamelia Hoit on 9/17 at 330 and mailed him a note as such.  Advised him to ensure that he is given pain medication and contact numbers when he is discharged. Pt voiced understanding.

## 2014-01-05 NOTE — Progress Notes (Signed)
Occupational Therapy Session Note  Patient Details  Name: Lance [name] MRN: [id number] Date of Birth: 03/11/1941  Today's Date: 01/05/2014 OT Individual Time: 4098-1191 OT Individual Time Calculation (min): 26 min    Short Term Goals: Week 1:  OT Short Term Goal 1 (Week 1): Patient will complete toilet transfer at supervision level OT Short Term Goal 2 (Week 1): Patient will complete LB dressing with min assist with use of AE, prn OT Short Term Goal 3 (Week 1): Patient will complete 1 grooming task in standing with SBA  Skilled Therapeutic Interventions/Progress Updates:  Pt transitioned from physical therapy session with 10/10 c/o pain. Pt reports pain as radiating down lower back and into R LE. Pt awaiting pain medication upon therapist arrival. OT educated pt on pain management for medications and strategies to utilize in order to alleviate pain. Once medication received, pt propelled self in wheelchair with B UEs ~ 250 feet with increased time. Pt engaged in stand pivot transfer with supervision. Pt demonstrated the ability to remove lumbar brace before laying down. Pt performed log roll technique to get into bed with supervision from therapist. Pt verbalizing 3/3 back precautions with 100% accuracy. OT educated pt on energy conservation strategies for occupational performance. OT discussed examples as well such as energy conservation when going to doctors office. Pt laying supine in bed with [name] present and all needed items within reach. Pt reports pain decrease to 7/10 once laying supine.   Therapy Documentation Precautions:  Precautions Precautions: Back;Fall Precaution Comments: Recalled 3/3 back precautions; cues at times for adherence during functional mobility Required Braces or Orthoses: Spinal Brace Spinal Brace: Lumbar corset;Applied in sitting position Restrictions Weight Bearing Restrictions: No   Pain: Pain Assessment Pain Assessment: 0-10 Pain Score: 10 Pain  Type: Acute pain Pain Location: Leg Pain Orientation: Right Pain Descriptors / Indicators: Radiating Pain Frequency: Constant Pain Onset: On-going Pain Intervention(s): Medication (See eMAR)  See FIM for current functional status  Therapy/Group: Individual Therapy  Lowella Grip 01/05/2014, 12:22 PM

## 2014-01-05 NOTE — Progress Notes (Signed)
Occupational Therapy Session Note  Patient Details  Name: Lance [name] MRN: [id number] Date of Birth: Feb 14, 1941  Today's Date: 01/05/2014 OT Individual Time: 0930-1030 OT Individual Time Calculation (min): 60 min    Short Term Goals: Week 1:  OT Short Term Goal 1 (Week 1): Patient will complete toilet transfer at supervision level OT Short Term Goal 2 (Week 1): Patient will complete LB dressing with min assist with use of AE, prn OT Short Term Goal 3 (Week 1): Patient will complete 1 grooming task in standing with SBA  Skilled Therapeutic Interventions/Progress Updates:  Patient received supine in bed with partner, [name] present at bedside. Patient's partner continues to be worried about patient discharging > home tomorrow. Therapist reassured [name] and encouraged [name] to assist with ADL this morning. Therapist educated [name] on safety with catheter, functional mobility, functional transfers, back precautions, use of AE to help increase patient's independence, and energy conservation techniques. Patient overall supervision for tasks. Patient continues to require min verbal cues for memory regarding day-to-day carryover with brace wearing orders. [name] is independent to assist patient once discharged > home. At end of session, left patient seated in w/c finishing dressing with PT present for next therapy session.   Precautions:  Precautions Precautions: Back;Fall Precaution Comments: Recalled 3/3 back precautions; cues at times for adherence during functional mobility Required Braces or Orthoses: Spinal Brace Spinal Brace: Lumbar corset;Applied in sitting position Restrictions Weight Bearing Restrictions: No  See FIM for current functional status  Therapy/Group: Individual Therapy  [name] 01/05/2014, 2:53 PM

## 2014-01-06 ENCOUNTER — Encounter (HOSPITAL_COMMUNITY): Payer: Medicare Other | Admitting: Occupational Therapy

## 2014-01-06 ENCOUNTER — Encounter (HOSPITAL_COMMUNITY): Payer: Self-pay | Admitting: Physical Medicine & Rehabilitation

## 2014-01-06 ENCOUNTER — Telehealth: Payer: Self-pay | Admitting: Physical Medicine & Rehabilitation

## 2014-01-06 MED ORDER — MAGIC MOUTHWASH: 5.0000 mL | Freq: Three times a day (TID) | ORAL | Status: DC | PRN

## 2014-01-06 MED ORDER — ACETAMINOPHEN 325 MG PO TABS: 325.0000 mg | ORAL_TABLET | ORAL | Status: DC | PRN

## 2014-01-06 MED ORDER — PANTOPRAZOLE SODIUM 40 MG PO TBEC: 40.0000 mg | DELAYED_RELEASE_TABLET | Freq: Two times a day (BID) | ORAL | Status: AC

## 2014-01-06 MED ORDER — LIDOCAINE 5 % EX PTCH: 2.0000 | MEDICATED_PATCH | CUTANEOUS | Status: AC

## 2014-01-06 MED ORDER — POLYETHYLENE GLYCOL 3350 17 G PO PACK: 17.0000 g | PACK | Freq: Every day | ORAL | Status: DC

## 2014-01-06 MED ORDER — RENA-VITE PO TABS: 1.0000 | ORAL_TABLET | Freq: Every day | ORAL | Status: AC

## 2014-01-06 MED ORDER — METHOCARBAMOL 500 MG PO TABS: 500.0000 mg | ORAL_TABLET | Freq: Four times a day (QID) | ORAL | Status: AC | PRN

## 2014-01-06 MED ORDER — GABAPENTIN 100 MG PO CAPS: 200.0000 mg | ORAL_CAPSULE | Freq: Three times a day (TID) | ORAL | Status: AC

## 2014-01-06 MED ORDER — TRAMADOL HCL 50 MG PO TABS: 25.0000 mg | ORAL_TABLET | Freq: Four times a day (QID) | ORAL | Status: DC | PRN

## 2014-01-06 MED ORDER — FAMOTIDINE 10 MG PO TABS: 10.0000 mg | ORAL_TABLET | Freq: Every day | ORAL | Status: AC

## 2014-01-06 NOTE — Progress Notes (Signed)
Pt agreeable to discharge today as originally planned. Partner [name] in for discharge education. Reviewed with pt and [name] how to care for foley catheter which pt is being d/c'd with. Reviewed all follow up appointments, medication list, and activity instructions. All questions answered to pt/partner's satisfaction. MD also into see pt prior to d/c and answered questions. Advised them to call us if any further questions/issues come up. Mick Sell RN

## 2014-01-06 NOTE — Plan of Care (Signed)
Problem: RH PAIN MANAGEMENT Goal: RH STG PAIN MANAGED AT OR BELOW PT'S PAIN GOAL < 4  Outcome: Not Progressing Pain level stated "5"

## 2014-01-06 NOTE — Progress Notes (Signed)
[place] PHYSICAL MEDICINE & REHABILITATION     PROGRESS NOTE    Subjective/Complaints: Pt wants to go home now.   Objective: Vital Signs: Blood pressure 105/62, pulse 95, temperature 99.7 F (37.6 C), temperature source Oral, resp. rate 18, height  (1.727 m), weight 64.3 kg (141 lb 12.1 oz), SpO2 94.00%. No results found.  Recent Labs  01/03/14 1105  WBC 11.3*  HGB 8.9*  HCT 27.5*  PLT 462*    Recent Labs  01/04/14 1300  NA 140  K 3.9  CL 103  GLUCOSE 141*  BUN 59*  CREATININE 3.12*  CALCIUM 9.7   CBG (last 3)  No results found for this basename: GLUCAP,  in the last 72 hours  Wt Readings from Last 3 Encounters:  01/06/14 64.3 kg (141 lb 12.1 oz)  12/22/13 62.5 kg (137 lb 12.6 oz)  12/22/13 62.5 kg (137 lb 12.6 oz)    Physical Exam:  Constitutional: He is oriented to person, place, and time. He appears well-developed and well-nourished.  HENT: oral mucosa pink/moist  Head: Normocephalic and atraumatic.  Eyes: Conjunctivae are normal. Pupils are equal, round, and reactive to light.  Neck: Normal range of motion. Neck supple.  Cardiovascular: Normal rate and regular rhythm.  Respiratory: Effort normal and breath sounds normal. No respiratory distress. He has no wheezes.  GI: He exhibits distension. Bowel sounds are increased. There is no tenderness.  Neurological: He is alert and oriented to person, place, and time.  Speech dysarthric. Follows commands without difficulty. UES: RUE: deltoid 5/5, bicep 5/5, tricep 5/5, wrist ext 5/5, hand intrinsics 5/5 LUE: deltoid 5/5, bicep 5/5, triceps 5/5, wrist ext 5/5, hand instrincs 5/5 RLE: Hip 3-/5 pain, Isolated KE 3 to 3+/5. HAD 3+/5. Ankle 4+/5 LLE: Hip 3+, KE 4/5, ankle 4/5.  Sensory changes over the anterior-lateral thigh to right knee. DTR's remain 1+  Skin: Skin is warm and dry.surgical site intact  M/S: back tender to touch and with proximal LE movement  Psychiatric: He has a normal mood and affect.  His behavior is normal. Judgment and thought content normal Urology: catheter in place  Assessment/Plan: 1. Functional deficits secondary to L3 fx and associated radiculopathy which require 3+ hours per day of interdisciplinary therapy in a comprehensive inpatient rehab setting. Physiatrist is providing close team supervision and 24 hour management of active medical problems listed below. Physiatrist and rehab team continue to assess barriers to discharge/monitor patient progress toward functional and medical goals.  Will pull together dc information  FIM: FIM - Bathing Bathing Steps Patient Completed: Chest;Right Arm;Left Arm;Abdomen;Front perineal area;Buttocks;Right upper leg;Left upper leg;Right lower leg (including foot);Left lower leg (including foot) Bathing: 5: Supervision: Safety issues/verbal cues (using LH sponge and min verbal cues for safety)  FIM - Upper Body Dressing/Undressing Upper body dressing/undressing steps patient completed: Thread/unthread right sleeve of pullover shirt/dresss;Thread/unthread left sleeve of pullover shirt/dress;Put head through opening of pull over shirt/dress;Pull shirt over trunk Upper body dressing/undressing: 5: Set-up assist to: Obtain clothing/put away (and don/doff brace) FIM - Lower Body Dressing/Undressing Lower body dressing/undressing steps patient completed: Thread/unthread right underwear leg;Thread/unthread left underwear leg;Pull underwear up/down;Thread/unthread right pants leg;Thread/unthread left pants leg;Pull pants up/down;Don/Doff right sock;Don/Doff left sock Lower body dressing/undressing: 5: Supervision: Safety issues/verbal cues (using reacher and sock aid)  FIM - Toileting Toileting steps completed by patient: Adjust clothing prior to toileting;Performs perineal hygiene;Adjust clothing after toileting Toileting Assistive Devices: Grab bar or rail for support;Toilet Aid/prosthesis/orthosis Toileting: 5: Supervision: Safety  issues/verbal cues  FIM - Diplomatic Services operational officer Devices: Elevated toilet seat;Grab bars;Walker Toilet Transfers: 5-To toilet/BSC: Supervision (verbal cues/safety issues);5-From toilet/BSC: Supervision (verbal cues/safety issues)  FIM - Banker Devices: Walker;Orthosis;Arm rests (rollator) Bed/Chair Transfer: 6: Supine > Sit: No assist;6: Sit > Supine: No assist;5: Bed > Chair or W/C: Supervision (verbal cues/safety issues);5: Chair or W/C > Bed: Supervision (verbal cues/safety issues)  FIM - Locomotion: Wheelchair Distance: 150 Locomotion: Wheelchair: 6: Travels 150 ft or more, turns around, maneuvers to table, bed or toilet, negotiates 3% grade: maneuvers on rugs and over door sills independently FIM - Locomotion: Ambulation Locomotion: Ambulation Assistive Devices: Other (comment);Orthosis (rollator) Ambulation/Gait Assistance: 5: Supervision Locomotion: Ambulation: 5: Travels 150 ft or more with supervision/safety issues  Comprehension Comprehension Mode: Auditory Comprehension: 5-Understands complex 90% of the time/Cues < 10% of the time  Expression Expression Mode: Verbal Expression: 5-Expresses complex 90% of the time/cues < 10% of the time  Social Interaction Social Interaction Mode: Asleep Social Interaction: 5-Interacts appropriately 90% of the time - Needs monitoring or encouragement for participation or interaction.  Problem Solving Problem Solving Mode: Asleep Problem Solving: 4-Solves basic 75 - 89% of the time/requires cueing 10 - 24% of the time  Memory Memory Mode: Asleep Memory: 3-Recognizes or recalls 50 - 74% of the time/requires cueing 25 - 49% of the time  Medical Problem List and Plan:  1. Functional deficits secondary to Rigth L3 radiculopathy , L3 compression fx s/p decompression/kyphoplasty  2. DVT Prophylaxis/Anticoagulation: Pharmaceutical: Lovenox  3. Pain Management: limited with meds  due to renal status and narc tolerance  -  lidoderm patches over leg; heat, ice ok too  -anxiety mgt 4. Anxiety disorder/Mood:improved but still anxious  -reiterated to him that improved anxiety control will assist in pain control 5. Neuropsych: This patient is capable of making decisions on her own behalf.  6. Skin/Wound Care: Pressure relief measures.   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 outpt follow up 8. Constipation: Will need aggressive bowel program.  Miralax. SSE prn.  9. H/o BPH:  Flomax to 0.8 mg.   -indwelling cath----continue at home  -follow up with [facility] as outpt  -ua neg, culture neg 11. Odynophagia:    -biotene, MMW  for oral hygiene and moisture  LOS (Days) 10 A FACE TO FACE EVALUATION WAS PERFORMED  Burlene Montecalvo T 01/06/2014 8:57 AM

## 2014-01-06 NOTE — Progress Notes (Signed)
Occupational Therapy Session Note &  Discharge Summary  Patient Details  Name: Lance [name] MRN: [id number] Date of Birth: 1941/01/08  Today's Date: 01/06/2014 OT Individual Time: 0800-0900 OT Individual Time Calculation (min): 60 min  Patient with 4/10 complaints of pain at beginning of session Patient received seated in w/c. Yesterday, patient's partner requested patient be SNF placement. Today, patient states he is going home. Therapist discussed d/c planning with patient regarding safety at home, safety with DME, need for 24/7 supervision for safety. Therapist also discussed importance of lumbar corset wearing orders of having brace donned during all mobility, including all standing activities. Patient aware not to stand during showers secondary to no back brace donned.  Therapist educated patient on BUE body weight strengthening exercises. Patient's partner, Lance [name] present and with excitement about taking patient home today. Lance [name] with multiple medical questions regarding follow up appointments, medication management, catheter management, etc. This therapist notified patient's RN of all questions. From here, Lance [name] assisted patient into bathroom for toileting needs and bathing from a shower level. Lance [name] is independent to assist patient, making sure patient is safe, adheres to back precautions, and adheres to back brace wearing orders. At end of session, left patient in BR with Lance [name] assisting. Patient discharging > home later this am.   -----------------------------------------------------------------------------------------------------------------------------------  DISCHARGE SUMMARY  Patient has met 9 of 9 long term goals due to improved activity tolerance, improved balance, postural control, ability to compensate for deficits, improved attention, improved awareness and improved coordination.  Patient to discharge at overall Supervision level. Patient is supervision for BADLs and simple IADL tasks  at this time. Patient's care partner is independent to provide the necessary supervision assistance at discharge.    Reasons goals not met: n/a all goals met at this time.   Recommendation:  Patient will benefit from ongoing skilled OT services in home health setting to continue to advance functional skills in the area of BADL, iADL and Reduce care partner burden.  Equipment: No equipment provided, patient states he has a shower seat and BSC  Reasons for discharge: treatment goals met and discharge from hospital  Patient/family agrees with progress made and goals achieved: Yes  Precautions/Restrictions  Precautions Precautions: Back;Fall Precaution Booklet Issued: No Precaution Comments: Recalled 3/3 back precautions; cues at times for adherence during functional mobility Required Braces or Orthoses: Spinal Brace Spinal Brace: Lumbar corset;Applied in sitting position Restrictions Weight Bearing Restrictions: No  Vital Signs Therapy Vitals Temp: 99.7 F (37.6 C) Temp src: Oral Pulse Rate: 95 Resp: 18 BP: 105/62 mmHg Oxygen Therapy SpO2: 94 % O2 Device: None (Room air)  Pain Pain Assessment Pain Assessment: 0-10 Pain Score: 4  Pain Type: Acute pain Pain Location: Leg Pain Orientation: Right Pain Descriptors / Indicators: Aching Pain Onset: Gradual Pain Intervention(s): RN made aware  ADL ADL Equipment Provided: Reacher;Sock aid;Long-handled sponge;Long-handled shoe horn  Vision/Perception  Vision- History Baseline Vision/History: Wears glasses Wears Glasses: At all times Patient Visual Report: No change from baseline Vision- Assessment Vision Assessment?: No apparent visual deficits   Cognition Overall Cognitive Status: Within Functional Limits for tasks assessed Arousal/Alertness: Awake/alert Orientation Level: Oriented X4 Attention: Selective Sustained Attention: Appears intact Memory: Impaired Awareness: Impaired Problem Solving: Impaired Problem  Solving Impairment: Functional basic Safety/Judgment: Appears intact  Sensation Sensation Additional Comments: BUEs appear intact Coordination Gross Motor Movements are Fluid and Coordinated: Yes Fine Motor Movements are Fluid and Coordinated: Yes  Motor  Motor Motor: Within Functional Limits (generalized deconditioned, pain often limiting strength)  Trunk/Postural  Assessment  Cervical Assessment Cervical Assessment: Within Functional Limits Thoracic Assessment Thoracic Assessment: Exceptions to [facility] (kyphotic posture) Lumbar Assessment Lumbar Assessment: Exceptions to [facility] (back precautions) Postural Control Postural Control: Within Functional Limits   Balance Balance Balance Assessed: Yes Static Sitting Balance Static Sitting - Level of Assistance: 6: Modified independent (Device/Increase time) Dynamic Sitting Balance Dynamic Sitting - Level of Assistance: 6: Modified independent (Device/Increase time) Static Standing Balance Static Standing - Level of Assistance: 5: Stand by assistance Dynamic Standing Balance Dynamic Standing - Level of Assistance: 5: Stand by assistance  Extremity/Trunk Assessment RUE Assessment RUE Assessment: Within Functional Limits LUE Assessment LUE Assessment: Within Functional Limits  See FIM for current functional status  Lance [name] 01/06/2014, 9:09 AM

## 2014-01-06 NOTE — Plan of Care (Signed)
Problem: SCI BLADDER ELIMINATION Goal: RH STG MANAGE BLADDER WITH ASSISTANCE STG Manage Bladder With mod Assistance  Outcome: Not Met (add Reason) Pt has indwelling Coude foley

## 2014-01-07 ENCOUNTER — Inpatient Hospital Stay (HOSPITAL_COMMUNITY): Payer: Medicare Other | Admitting: Physical Therapy

## 2014-01-08 ENCOUNTER — Inpatient Hospital Stay (HOSPITAL_COMMUNITY): Payer: Medicare Other | Admitting: Occupational Therapy

## 2014-01-08 ENCOUNTER — Inpatient Hospital Stay (HOSPITAL_COMMUNITY): Payer: Medicare Other | Admitting: Physical Therapy

## 2014-01-09 LAB — KAPPA/LAMBDA LIGHT CHAINS
KAPPA, LAMDA LIGHT CHAIN RATIO: 15.49 — AB (ref 0.26–1.65)
Kappa free light chain: 65.2 mg/dL — ABNORMAL HIGH (ref 0.33–1.94)
Lambda free light chains: 4.21 mg/dL — ABNORMAL HIGH (ref 0.57–2.63)

## 2014-01-11 NOTE — Consult Note (Signed)
NEUROCOGNITIVE STATUS EXAMINATION - [facility]   Mr. SergWilliard Kelleris a 73 year old man, who was seen for a brief neurocognitive status examination to evaluate his emotional state and mental status in the setting of compression fracture with radiculopathy and renal amyloidosis.  According to his medical record, he was admitted on 12/22/13 following elective decompressive laminectomy with foraminotomy and open kyphoplasty vertebroplasty.  Bone biopsy was negative for tumor but he had a recent diagnosis of renal amyloidosis requiring follow-up with hematology/oncology as well as nephrology.  He developed confusion with hallucinations when given steroids, but his symptoms have seemed to improve since they were discontinued.  Still, he has been reported to have significant anxiety, particularly surrounding discharge, and a neuropsychological consult was requested to identify whether cognitive disruption continues to exist and to provide an evaluation of mood symptoms.    Emotional Functioning:  During the clinical interview, which was conducted in the presence of Mr. Fournet' husband, Mr. Jansma described his biggest concern as his pain level when walking.  His fears about pain were processed and it seemed as if most fears were related to how he would be able to manage such intense pain over the long-term, if it persisted and was never able to be well-managed.  His spouse disclosed additional concerns including his health with impending discharge (e.g. possibilities for kidney failure and falling after he is home).  He and Mr. Blanck commented on how there seems to be "so much uncertainty."  Mr. Skowron stated that he relies on family support, including support of his spouse, as well as support from friends, in order to cope with difficult times such as this.  Despite his frustration with pain, including dreaming about being in pain, Mr. Bogard denied symptoms of depression,  including suicidal ideation.  He said that it is helpful for him when staff members and his support team speak positively and optimistically about the future.  Mr. Danzy' responses to self-report measures of mood symptoms were suggestive of the presence of mild depression, but were not indicative of clinically significant anxiety at this time.    Mental Status:  Mr. Penick' total score on an overall measure of mental status was suggestive of the presence of significant cognitive disruption at the level of dementia (MoCA = 19/30).  He demonstrated executive dysfunction, as well as poor delayed memory.  Subjectively, he denied noticing major cognitive changes.    Impressions and Recommendations:  Mr. Capano' overall neurocognitive profile was suggestive of the presence of clinically significant cognitive disruption, at the level of dementia.  He demonstrated most trouble with executive functioning and memory.  Notably, he had just taken pain medication prior to this testing session and seemed quite fatigued, which could have adversely impacted his testing performances.  Still, there is suspicion that he has some additional underlying cognitive disruption that cannot be accounted for by fatigue.  More comprehensive neuropsychological evaluation is recommended in the future as an outpatient, should his symptoms not improve when he is less fatigued or should cognitive impairments begin to adversely affect his daily functioning.  These results were explained to Mr. Stankey and his husband and they seemed to understand and agreed to seek a neuropsychological evaluation in the future, if the above circumstances presented.  From an emotional standpoint, Mr. Monarch endorsed symptoms consistent with mild depressed mood and he disclosed certain fears about pain and discharge.  However, the intensity of these symptoms was relatively mild and in fact, the symptoms seemed  most consistent with an adjustment reaction to illness.   Still, he may benefit from participating in individual or couples' psychotherapy post-discharge and he was open to that.  Information on local providers should be included in his discharge paperwork.  In the meantime, staff members should be aware that he finds it beneficial to speak optimistically and may choose to do that with him whenever possible to help elevate his mood.    DIAGNOSES: Renal amyloidosis Compression fracture with radiculopathy Adjustment disorder with depressed mood  Leavy Cella, Psy.D.  Clinical Neuropsychologist

## 2014-01-12 LAB — CHROMOSOME ANALYSIS, BONE MARROW

## 2014-01-15 DIAGNOSIS — F4323 Adjustment disorder with mixed anxiety and depressed mood: Secondary | ICD-10-CM | POA: Diagnosis present

## 2014-01-15 DIAGNOSIS — E859 Amyloidosis, unspecified: Secondary | ICD-10-CM | POA: Diagnosis present

## 2014-01-15 DIAGNOSIS — N184 Chronic kidney disease, stage 4 (severe): Secondary | ICD-10-CM | POA: Diagnosis present

## 2014-01-15 DIAGNOSIS — R338 Other retention of urine: Secondary | ICD-10-CM | POA: Diagnosis present

## 2014-01-15 NOTE — Discharge Summary (Addendum)
Physician Discharge Summary  Patient ID: Lance [name] MRN: [id number] DOB/AGE: 12-09-1940 73 y.o.  Admit date: 12/27/2013 Discharge date: 01/06/2014  Discharge Diagnoses:  Principal Problem:   Myelopathy of lumbar region Active Problems:   Spinal stenosis of lumbar region   Lumbar vertebral fracture   Acute urinary retention   Adjustment disorder with mixed anxiety and depressed mood   Acute renal failure on Chronic kidney disease, stage IV (severe)   Amyloidosis   Discharged Condition: Stable.   Significant Diagnostic Studies: US Renal  12/29/2013   CLINICAL DATA:  Acute renal failure  EXAM: RENAL/URINARY TRACT ULTRASOUND COMPLETE  COMPARISON:  12/16/2013  FINDINGS: Right Kidney:  Length: 10.5 cm. A small parapelvic cyst is noted similar to that seen on prior CTs.  Left Kidney:  Length: 1.6 cm. Changes from the previous biopsy are no longer identified.  Bladder:  Partially decompressed by Foley catheter.  IMPRESSION: No acute abnormality is noted.  Stable right parapelvic cyst.   Electronically Signed   By: Inez Catalina M.D.   On: 12/29/2013 19:10   Ct Biopsy  01/03/2014   CLINICAL DATA:  73 year old with history of amyloidosis and request for bone marrow biopsy.  EXAM: CT GUIDED BONE MARROW ASPIRATES AND BIOPSY  Physician: Stephan Minister. Anselm Pancoast, MD  MEDICATIONS: 1 mg versed, 25 mcg fentanyl. A radiology nurse monitored the patient for moderate sedation.  ANESTHESIA/SEDATION: Sedation time: 11 min  PROCEDURE: Informed consent was obtained for a bone marrow biopsy. The patient was placed on his left side. Images of the pelvis were obtained. The left side of back was prepped and draped in sterile fashion. The skin and left posterior iliac bone were anesthetized with 1% lidocaine. 11 gauge bone needle was directed into the left iliac bone with CT guidance. Two aspirates and 1 core biopsy obtained.  FINDINGS: Bone needle directed into the posterior left iliac bone.  COMPLICATIONS: None   IMPRESSION: CT guided bone marrow aspirates and core biopsy.   Electronically Signed   By: Markus Daft M.D.   On: 01/03/2014 13:13    Labs:  Basic Metabolic Panel:    Component Value Date/Time   NA 140 01/04/2014 1300   K 3.9 01/04/2014 1300   CL 103 01/04/2014 1300   CO2 22 01/04/2014 1300   GLUCOSE 141* 01/04/2014 1300   BUN 59* 01/04/2014 1300   CREATININE 3.12* 01/04/2014 1300   CALCIUM 9.7 01/04/2014 1300   GFRNONAA 19* 01/04/2014 1300   GFRAA 22* 01/04/2014 1300     CBC: CBC Latest Ref Rng 01/03/2014 01/01/2014 12/28/2013  WBC 4.0 - 10.5 K/uL 11.3(H) 10.5 13.0(H)  Hemoglobin 13.0 - 17.0 g/dL 8.9(L) 10.0(L) 10.7(L)  Hematocrit 39.0 - 52.0 % 27.5(L) 31.1(L) 32.6(L)  Platelets 150 - 400 K/uL 462(H) 555(H) 483(H)     CBG: No results found for this basename: GLUCAP,  in the last 168 hours  Brief HPI:   WISDOM RICKEY is a 73 y.o. male with history of CKD, anxiety disorder, recent 20 lbs weight loss with 3 month h/o progressive weakness and malaise, Lifting injury 2 months ago with subsequent L3 compression Fx with stenosis and L3 radiculopathy. He has failed conservative treatment and elected to undergo L2/3 decompressive laminectomy with foraminotomy and open kyphoplasty vertebroplasty by Dr. Annette Stable on 12/22/13. Bone biopsy negative for tumor and patient with recent dx of renal amyloidosis and will need follow up with Heme/Onc as well as nephrology (MD in [place]). Post op has had issues with urinary retention, acute  on chronic renal failure with BUN/Cr rising to 69/2.84. He developed confusion with hallucinations and tremors this weekend with initiation of steroids. Decadron d/c 08/24 and mentation clearing. Foley discontinued last pm but patient unable to void with in/out cath revealing 900 cc.  CIR was recommended for progressive therapy.   Hospital Course: [facility] Ignacio Lowder was admitted to rehab 12/27/2013 for inpatient therapies to consist of PT, ST and OT at least three hours five days a week. Past  admission physiatrist, therapy team and rehab RN have worked together to provide customized collaborative inpatient rehab. Blood pressures have been reasonably controlled. He was noted to have thrush with sore throat due to steroid use and was treated with diflucan X 7 days followed by magic mouthwash to help ameliorate symptoms. Nutritional supplements were added to help maintain adequate po intake and promote wound healing.  Voiding trial was initiated but patient continued to have difficulty voiding as well as difficulty tolerating in and out cath. Foley were replaced on 08/27 and he is to follow up with urology for repeat trial past discharge. Daily labs were ordered to monitor renal status and nephrology was consulted for input due to worsening of renal status with rise in BUN/Cr to 98/3.33.  Hyperkalemia was treated with kayexalate and renal diet. Dr. Posey Pronto felt that acute on chronic renal failure was related to ATN due to intermittent episodes of hypotension post operatively with minor role from BOO. Renal ultrasound was negative for hydronephrosis. He was treated with IVF with steady improvement. Fluids were discontinued on 09/01 and renal status continued to improve off this.    Dr. Earlie Server was consulted for input on new diagnosis of amyloidosis and recommended SPEP with immunofixation as well as bone marrow biopsy. CT guided bone marrow biopsy was done by IVR on 01/03/14. 2D echo done revealing moderate LVH with EF 60-65%.  Dr. Harrington Challenger was consulted for input and felt that myocardium was not diagnostic of amyloid infiltration and to follow up in office in 3 weeks.  Pain and anxiety have been an ongoing issues with ego support provided to patient as well as his partner. Patient was unable to tolerate narcotics therefore judicious use of low dose tramadol as well as robaxin were used for pain management. Dr. Pollard/neuropsychology has followed for support and felt that patient had symptoms more consistent  with adjustment reaction as well as significant cognitive disruption with poor delayed memory. This could have been impacted by pain and/or fatigue but patient and husband were encouraged to follow up for comprehensive neuropsychological evaluation if symptoms did not improve in the future.   Back incision is healing well without signs or symptoms of infection. Po intake is slowly improving and constipation has resolved with set up of bowel program. Patient is able to recall back precautions but requires cues at time to maintain them during functional activity. His spouse has been present for multiple therapy sessions. Patient has made great progress but requires supervision due to pain, endurance well as memory deficits.  He will continue to receive HHPT, HHOT and [place] by [facility] past discharge.   Rehab course: During patient's stay in rehab weekly team conferences were held to monitor patient's progress, set goals and discuss barriers to discharge.   At admission he required min to total assist with ADL tasks and min to moderate assist with mobility. Patient has had improvement in activity tolerance, balance, strength, as well as ability to compensate for deficits.  He is able to complete  IADLs and ADLs with supervision. He is modified independent for bed mobility and is able to ambulate 150 feet and navigate 5 stairs with supervision. Family education has been done with significant other regarding walker safety, adhering to back precautions as well as need for supervision with mobility.       Disposition: Home.   Diet: Renal diet.   Special Instructions: 1. No bending, twisting or arching. Do not lift anything over 5 lbs.     Medication List    STOP taking these medications       famotidine 10 MG chewable tablet  Commonly known as:  PEPCID AC  Replaced by:  famotidine 10 MG tablet     HYDROcodone-acetaminophen 5-325 MG per tablet  Commonly known as:  NORCO/VICODIN      TAKE  these medications       acetaminophen 325 MG tablet  Commonly known as:  TYLENOL  Take 1-2 tablets (325-650 mg total) by mouth every 4 (four) hours as needed for mild pain.     BIOFREEZE EX  Apply 1 application topically 3 (three) times daily as needed (for pain).     docusate sodium 100 MG capsule  Commonly known as:  COLACE  Take 100 mg by mouth daily.     famotidine 10 MG tablet  Commonly known as:  PEPCID  Take 1 tablet (10 mg total) by mouth daily.     gabapentin 100 MG capsule  Commonly known as:  NEURONTIN  Take 2 capsules (200 mg total) by mouth 3 (three) times daily.     lidocaine 5 %  Commonly known as:  LIDODERM  Place 2 patches onto the skin daily. Remove & Discard patch within 12 hours or as directed by MD     magic mouthwash Soln  Take 5 mLs by mouth 3 (three) times daily as needed for mouth pain.     methocarbamol 500 MG tablet  Commonly known as:  ROBAXIN  Take 1 tablet (500 mg total) by mouth every 6 (six) hours as needed for muscle spasms.     multivitamin Tabs tablet  Take 1 tablet by mouth at bedtime.     pantoprazole 40 MG tablet  Commonly known as:  PROTONIX  Take 1 tablet (40 mg total) by mouth 2 (two) times daily.     polyethylene glycol packet  Commonly known as:  MIRALAX / GLYCOLAX  Take 17 g by mouth daily.     pravastatin 20 MG tablet  Commonly known as:  PRAVACHOL  Take 20 mg by mouth at bedtime.     SYNTHROID 50 MCG tablet  Generic drug:  levothyroxine  Take 50 mcg by mouth daily.     tamsulosin 0.4 MG Caps capsule  Commonly known as:  FLOMAX  Take 0.4 mg by mouth daily.     traMADol 50 MG tablet  Commonly known as:  ULTRAM  Take 0.5 tablets (25 mg total) by mouth every 6 (six) hours as needed for moderate pain.       Follow-up Information   Follow up with Meredith Staggers, MD On 02/07/2014. (BE there at 12:30   for 1 pm   appointment  )    Specialty:  Physical Medicine and Rehabilitation   Contact information:   [address], [address] [place] [address] [phone]       Follow up with Charlie Pitter, MD. Call today. (for post op check in next 2-3 weeks. )    Specialty:  Neurosurgery   Contact information:   [address] [place] [address] [phone]       Follow up with Cay Schillings, MD Today. (for follow up appointment)    Specialty:  Unknown Physician Specialty   Contact information:   [address] [place] [address] [phone]       Follow up with Chesley Noon, MD On 01/11/2014. (@ 3:00 PM)    Specialty:  Family Medicine   Contact information:   [place] [address] [phone]       Follow up with [facility], NP On 01/16/2014. (Be there at 10:30 for follow up on retention. )    Specialty:  Urology   Contact information:   [place] [address] [phone]       Follow up with Dorris Carnes, MD. (Office will call you for your followup appointment)    Specialty:  Cardiology   Contact information:   [place] [address] [phone]       Call Meredith Staggers, MD.   Specialty:  Physical Medicine and Rehabilitation   Contact information:   [address], [place] [address] [phone]       Call Estanislado Emms, MD.   Specialty:  Nephrology   Contact information:   [place] [address] [phone]       Signed: Bary Leriche 01/15/2014, 2:09 PM

## 2014-01-18 ENCOUNTER — Telehealth: Payer: Self-pay | Admitting: *Deleted

## 2014-01-18 ENCOUNTER — Ambulatory Visit (HOSPITAL_BASED_OUTPATIENT_CLINIC_OR_DEPARTMENT_OTHER): Payer: Medicare Other | Admitting: Hematology and Oncology

## 2014-01-18 VITALS — BP 99/65 | HR 101 | Temp 98.3°F | Resp 20 | Ht 68.0 in | Wt 134.0 lb

## 2014-01-18 DIAGNOSIS — M545 Low back pain, unspecified: Secondary | ICD-10-CM

## 2014-01-18 DIAGNOSIS — N19 Unspecified kidney failure: Secondary | ICD-10-CM

## 2014-01-18 DIAGNOSIS — E859 Amyloidosis, unspecified: Secondary | ICD-10-CM

## 2014-01-18 NOTE — Assessment & Plan Note (Signed)
AL amyloidosis causing renal failure. Bone marrow biopsy showed 15% monoclonal plasma cells with amyloid deposition. I discussed with him the pathophysiology of how malignant monoclonal plasma cells in the bone marrow can lead to excess deposition of amyloid protein in different tissues and organs. I recommended treatment with Velcade Decadron weekly. Discussed risks of Velcade including the risk of neuropathy and thrombocytopenia. I recommended once a week subcutaneous injections.  Patient requests a second opinion consultation with [facility]. I called the [facility] and Dr.Sasha Vevelyn Francois is the amyloidosis specialist who would be seeing the patient. I instructed the patient to call us with the treatment plan. If he is enrolled in a clinical trial then he may have to continue treatment over there. If the patient wishes to get treated here he will call us back and we will schedule an appointment.  Low back pain: Related to recent surgery. I instructed the patient continue oral pain medications and a surgical pain will slowly get better over time.

## 2014-01-18 NOTE — Telephone Encounter (Signed)
Results from [facility] biopsy received from [facility]. Copy given to Dr. Pamelia Hoit to review and original sent to HIM to be scanned into patient's chart.

## 2014-01-18 NOTE — Progress Notes (Signed)
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   HISTORY OF PRESENTING ILLNESS:  Lance [name] 73 y.o. male is here because of recent diagnosis of AL amyloidosis. Patient presented and off August with kidney problems and was diagnosed with renal failure/chronic kidney disease. He underwent a kidney biopsy that revealed AL amyloidosis. He was admitted to the hospital with complaints of low back pain and was found to have compression fractures. He could not undergo kyphoplasty or any surgical procedures because of extremely soft bones. He has now a back brace and complains of intense low back pain. Medical oncology was consulted when he was in the hospital and a bone marrow biopsy was ordered. The bone marrow biopsy revealed 15% monoclonal plasma cell with amyloid deposition. He also underwent a cardiac echocardiogram which showed normal ejection fraction of 65% but showed thickening of the left ventricular wall. He has been sent to was back to discuss treatment options for amyloidosis. The patient is accompanied by his male partner with whom he had been married for 50 years. The major complaint is the intense back pain related to recent surgeries. The patient is being managed with oral pain medications. He also has a urinary catheter which is being managed by urology. Patient reports that for the past several years he had been losing weight and getting more and more frail. He is here today in a wheelchair.  I reviewed her records extensively and collaborated the history with the patient.  MEDICAL HISTORY:  Past Medical History  Diagnosis Date  . GERD (gastroesophageal reflux disease)   . Arthritis     osteoarthritis  . Hypothyroidism   . Chronic kidney disease      bph,kidney bx,renal insuff.  . Anxiety   . Weight decrease     20lbs    SURGICAL HISTORY: Past Surgical History  Procedure Laterality Date  . Rotator cuff repair  2004    right rotator cuff repair  . 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 Hand surgery    . Renal biopsy    . Laminectomy      L2 L3  WITH OPEN KYPHOPLASTY  . Lumbar laminectomy/decompression microdiscectomy Right 12/22/2013    Procedure: Right L/2-3LUMBAR DECOMPRESSION 1 LEVEL ;  Surgeon: Charlie Pitter, MD;  Location: [facility] OR[facility];  Service: Neurosurgery;  Laterality: Right;  Right L/2-3LUMBAR DECOMPRESSION 1 LEVEL    SOCIAL HISTORY: History   Social History  . Marital Status: Single    Spouse Name: N/A    Number of Children: N/A  . Years of Education: N/A   Occupational History  . Not on file.   Social History Main Topics  . Smoking status: Never Smoker   . Smokeless tobacco: Never Used  . Alcohol Use: Yes     Comment: socially  . Drug Use: No  . Sexual Activity: Not on file   Other Topics Concern  . Not on file   Social History Narrative  . No narrative on file    FAMILY HISTORY: No family history on file.  ALLERGIES:  has No Known Allergies.  MEDICATIONS:  Current  Outpatient Prescriptions  Medication Sig Dispense Refill  . acetaminophen (TYLENOL) 325 MG tablet Take 1-2 tablets (325-650 mg total) by mouth every 4 (four) hours as needed for mild pain.      Marland Kitchen ALPRAZolam (XANAX) 0.5 MG tablet Take 0.5 mg by mouth.      . Alum & Mag Hydroxide-Simeth (MAGIC MOUTHWASH) SOLN Take 5 mLs by mouth 3 (three) times daily as needed for mouth pain.  90 mL  3  . AVODART 0.5 MG capsule       . docusate sodium (COLACE) 100 MG capsule Take 100 mg by mouth daily.      . famotidine (PEPCID) 10 MG tablet  Take 1 tablet (10 mg total) by mouth daily.  30 tablet  3  . gabapentin (NEURONTIN) 100 MG capsule Take 2 capsules (200 mg total) by mouth 3 (three) times daily.  180 capsule  3  . levothyroxine (SYNTHROID) 50 MCG tablet Take 50 mcg by mouth daily.      Marland Kitchen lidocaine (LIDODERM) 5 % Place 2 patches onto the skin daily. Remove & Discard patch within 12 hours or as directed by MD  60 patch  3  . Menthol, Topical Analgesic, (BIOFREEZE EX) Apply 1 application topically 3 (three) times daily as needed (for pain).      . methocarbamol (ROBAXIN) 500 MG tablet Take 1 tablet (500 mg total) by mouth every 6 (six) hours as needed for muscle spasms.  90 tablet  3  . multivitamin (RENA-VIT) TABS tablet Take 1 tablet by mouth at bedtime.  30 tablet  1  . pantoprazole (PROTONIX) 40 MG tablet Take 1 tablet (40 mg total) by mouth 2 (two) times daily.  60 tablet  3  . polyethylene glycol (MIRALAX / GLYCOLAX) packet Take 17 g by mouth daily.  30 each  3  . pravastatin (PRAVACHOL) 20 MG tablet Take 20 mg by mouth at bedtime.       . tamsulosin (FLOMAX) 0.4 MG CAPS capsule Take 0.4 mg by mouth daily.      . traMADol (ULTRAM) 50 MG tablet Take 0.5 tablets (25 mg total) by mouth every 6 (six) hours as needed for moderate pain.  75 tablet  1  . cyclobenzaprine (FLEXERIL) 5 MG tablet       . diclofenac (VOLTAREN) 75 MG EC tablet       . esomeprazole (NEXIUM) 40 MG capsule       . HYDROcodone-acetaminophen (NORCO/VICODIN) 5-325 MG per tablet       . predniSONE (DELTASONE) 10 MG tablet        No current facility-administered medications for this visit.    REVIEW OF SYSTEMS:   Constitutional: Denies fevers, chills or abnormal night sweats Eyes: Denies blurriness of vision, double vision or watery eyes Ears, nose, mouth, throat, and face: Denies mucositis or sore throat Respiratory: Denies cough, dyspnea or wheezes Cardiovascular: Denies palpitation, chest discomfort or lower extremity swelling Gastrointestinal:  Denies  nausea, heartburn or change in bowel habits Skin: Denies abnormal skin rashes Lymphatics: Denies new lymphadenopathy or easy bruising Neurological: Wasting of lower legs. Behavioral/Psych: Mood is stable, no new changes  Musculoskeletal: Severe back pain. All other systems were reviewed with the patient and are negative.  PHYSICAL EXAMINATION: ECOG PERFORMANCE STATUS: 2 - Symptomatic, <50% confined to bed  Filed Vitals:   01/18/14 1530  BP: 99/65  Pulse: 101  Temp: 98.3 F (36.8 C)  Resp: 20   Filed Weights   01/18/14 1530  Weight: 134  lb (60.782 kg)    GENERAL:alert, no distress and comfortable SKIN: skin color, texture, turgor are normal, no rashes or significant lesions EYES: normal, conjunctiva are pink and non-injected, sclera clear OROPHARYNX:no exudate, no erythema and lips, buccal mucosa, and tongue normal  NECK: supple, thyroid normal size, non-tender, without nodularity LYMPH:  no palpable lymphadenopathy in the cervical, axillary or inguinal LUNGS: clear to auscultation and percussion with normal breathing effort HEART: regular rate & rhythm and no murmurs and no lower extremity edema ABDOMEN:abdomen soft, non-tender and normal bowel sounds Musculoskeletal:no cyanosis of digits and no clubbing patient has a back brace PSYCH: alert & oriented x 3 with fluent speech NEURO: no focal motor/sensory deficits, wasting noted of his peripheral vasculature   LABORATORY DATA:  I have reviewed the data as listed Lab Results  Component Value Date   WBC 11.3* 01/03/2014   HGB 8.9* 01/03/2014   HCT 27.5* 01/03/2014   MCV 88.4 01/03/2014   PLT 462* 01/03/2014   Lab Results  Component Value Date   NA 140 01/04/2014   K 3.9 01/04/2014   CL 103 01/04/2014   CO2 22 01/04/2014   Kappa 65; Lambda 2  RADIOGRAPHIC STUDIES: I have personally reviewed the radiological reports and agreed with the findings in the report.  MRI lumbar spine:1. Acute or subacute L3 compression fracture. Maximal  loss of  vertebral body height is 50%. Mild retropulsion. This is favored to  be a benign osteoporotic compression fracture.  2. No other bone lesions are identified to suggest metastatic  disease.  3. L3-L4 multifactorial RIGHT greater than LEFT foraminal stenosis  potentially affecting the L3 nerves. More mild degenerative disease  at other levels  Bone survey:1. Mild to moderate L3 compression fracture appears to be subacute  or chronic. This may be benign, but if there is acute low back pain  then consider lumbar MRI to evaluate bone marrow signal at L3 and  elsewhere in the lumbar spine in this setting.  2. No other suspicious osseous lesion identified. Degenerative  changes at the right proximal humerus, left hip, in the cervical  spine, and mid thoracic spine. Previous right hip arthroplasty.   ASSESSMENT AND PLAN:  Amyloidosis AL amyloidosis causing renal failure. Bone marrow biopsy showed 15% monoclonal plasma cells with amyloid deposition. I discussed with him the pathophysiology of how malignant monoclonal plasma cells in the bone marrow can lead to excess deposition of amyloid protein in different tissues and organs. I recommended treatment with Velcade Decadron weekly. Discussed risks of Velcade including the risk of neuropathy and thrombocytopenia. I recommended once a week subcutaneous injections.  Patient requests a second opinion consultation with [facility]. I called the [facility] and Dr.Sasha Vevelyn Francois is the amyloidosis specialist who would be seeing the patient. I instructed the patient to call us with the treatment plan. If he is enrolled in a clinical trial then he may have to continue treatment over there. If the patient wishes to get treated here he will call us back and we will schedule an appointment.  Low back pain: Related to recent surgery. I instructed the patient continue oral pain medications and a surgical pain will slowly get better over  time.    All questions were answered. The patient knows to call the clinic with any problems, questions or concerns. I spent 55 minutes counseling the patient face to face. The total time spent in the appointment was 60 minutes and more than 50% was on counseling.  Rulon Eisenmenger, MD 01/18/2014 4:45 PM

## 2014-01-22 ENCOUNTER — Telehealth: Payer: Self-pay

## 2014-01-22 NOTE — Telephone Encounter (Signed)
Faxed last office note,  [employer], op note to [facility] at Dr. Leeanne Deed.  Sent to scan.

## 2014-01-23 ENCOUNTER — Telehealth: Payer: Self-pay | Admitting: *Deleted

## 2014-01-23 NOTE — Telephone Encounter (Signed)
Progress notes/surgical pathology faxed to [facility]/Dr. Leeanne Deed. Sent to scan.

## 2014-01-29 ENCOUNTER — Telehealth: Payer: Self-pay

## 2014-01-29 ENCOUNTER — Other Ambulatory Visit: Payer: Self-pay

## 2014-01-29 ENCOUNTER — Telehealth: Payer: Self-pay | Admitting: Hematology and Oncology

## 2014-01-29 NOTE — Telephone Encounter (Signed)
Returned call to spouse re: pt condition and treatment decision.  Spouse reportrs patients is having worsening skin breakdown and confusion/disorientation.  Pain continues.  Spouse does not believe patient will "make it" until appt at [facility] 10/7.  Home health nurse comes in 2x/wk and PT comes in to work with pt.  Spouse is questioning his treatment decision as to treatment here, [facility] or hospice.  He wants patient to "be comfortable".   Spouse reports only giving partial dose of pain meds and not giving tramadol.  Let spouse know to give full dose of pain meds for therapeutic effect.  Also spoke with patient about repositioning to reduce pressure wounds.  Spouse wants to discuss further with Dr. Pamelia Hoit.    Per Dr. Pamelia Hoit, bring him in to discuss.   Called spouse back with appt d/t.  He voiced understanding.    Returned call to spouse - Physical therapy came in.  Pt could not walk from living room to bathroom and raised HR to 120.  Spouse wants to know if it is ok to come in to see Dr. Pamelia Hoit on his own.  Let spouse know if he is health care power of attorney and pt is disoriented and confused to where he cannot contribute to the decision making process, there would be no reason to bring patient in.  Spouse advised he would be by himself tomorrow and is leaning towards hospice.

## 2014-01-29 NOTE — Telephone Encounter (Signed)
per pof to sch pt appt-TF gave appt to pt and pt is aware per TF

## 2014-01-30 ENCOUNTER — Ambulatory Visit (HOSPITAL_BASED_OUTPATIENT_CLINIC_OR_DEPARTMENT_OTHER): Payer: Self-pay | Admitting: Hematology and Oncology

## 2014-01-30 DIAGNOSIS — E859 Amyloidosis, unspecified: Secondary | ICD-10-CM

## 2014-01-30 NOTE — Progress Notes (Signed)
Lance [name] contaced re: hospice care.  Lance [name] referred [facility] [place].  [facility] will set up for admission appt 9/30.

## 2014-01-30 NOTE — Progress Notes (Signed)
Patient Care Team: Eartha Inch, MD as PCP - General (Family Medicine)  DIAGNOSIS: AL amyloidosis  CHIEF COMPLIANT: Incapacitated rapidly declining performance status  INTERVAL HISTORY: Lance [name] is a 73 year old gentleman who has above-mentioned history of a.l. Amyloidosis, who has renal, cardiac and bone marrow involvement and I recommended Velcade Decadron. Initially they wanted to go and a physician at [facility] but called in urgently to see me because patient is declining significantly and they do not want to pursue any additional treatment and would like to get hospice care. Patient was unable to make up for this appointment but his significant other came by to discuss his care. Patient is confused disoriented and since he is the power of attorney, he is legally able to make decisions for Mr. Want.  ALLERGIES:  has No Known Allergies.  MEDICATIONS:  Current Outpatient Prescriptions  Medication Sig Dispense Refill  . acetaminophen (TYLENOL) 325 MG tablet Take 1-2 tablets (325-650 mg total) by mouth every 4 (four) hours as needed for mild pain.      Marland Kitchen ALPRAZolam (XANAX) 0.5 MG tablet Take 0.5 mg by mouth.      . Alum & Mag Hydroxide-Simeth (MAGIC MOUTHWASH) SOLN Take 5 mLs by mouth 3 (three) times daily as needed for mouth pain.  90 mL  3  . AVODART 0.5 MG capsule       . cyclobenzaprine (FLEXERIL) 5 MG tablet       . diclofenac (VOLTAREN) 75 MG EC tablet       . docusate sodium (COLACE) 100 MG capsule Take 100 mg by mouth daily.      Marland Kitchen esomeprazole (NEXIUM) 40 MG capsule       . famotidine (PEPCID) 10 MG tablet Take 1 tablet (10 mg total) by mouth daily.  30 tablet  3  . gabapentin (NEURONTIN) 100 MG capsule Take 2 capsules (200 mg total) by mouth 3 (three) times daily.  180 capsule  3  . HYDROcodone-acetaminophen (NORCO/VICODIN) 5-325 MG per tablet       . levothyroxine (SYNTHROID) 50 MCG tablet Take 50 mcg by mouth daily.      Marland Kitchen lidocaine (LIDODERM) 5 % Place 2 patches  onto the skin daily. Remove & Discard patch within 12 hours or as directed by MD  60 patch  3  . Menthol, Topical Analgesic, (BIOFREEZE EX) Apply 1 application topically 3 (three) times daily as needed (for pain).      . methocarbamol (ROBAXIN) 500 MG tablet Take 1 tablet (500 mg total) by mouth every 6 (six) hours as needed for muscle spasms.  90 tablet  3  . multivitamin (RENA-VIT) TABS tablet Take 1 tablet by mouth at bedtime.  30 tablet  1  . pantoprazole (PROTONIX) 40 MG tablet Take 1 tablet (40 mg total) by mouth 2 (two) times daily.  60 tablet  3  . polyethylene glycol (MIRALAX / GLYCOLAX) packet Take 17 g by mouth daily.  30 each  3  . pravastatin (PRAVACHOL) 20 MG tablet Take 20 mg by mouth at bedtime.       . predniSONE (DELTASONE) 10 MG tablet       . tamsulosin (FLOMAX) 0.4 MG CAPS capsule Take 0.4 mg by mouth daily.      . traMADol (ULTRAM) 50 MG tablet Take 0.5 tablets (25 mg total) by mouth every 6 (six) hours as needed for moderate pain.  75 tablet  1   No current facility-administered medications for this visit.  LABORATORY DATA:  I have reviewed the data as listed   Chemistry      Component Value Date/Time   NA 140 01/04/2014 1300   K 3.9 01/04/2014 1300   CL 103 01/04/2014 1300   CO2 22 01/04/2014 1300   BUN 59* 01/04/2014 1300   CREATININE 3.12* 01/04/2014 1300      Component Value Date/Time   CALCIUM 9.7 01/04/2014 1300   ALKPHOS 216* 12/28/2013 0630   AST 31 12/28/2013 0630   ALT 22 12/28/2013 0630   BILITOT 0.5 12/28/2013 0630       Lab Results  Component Value Date   WBC 11.3* 01/03/2014   HGB 8.9* 01/03/2014   HCT 27.5* 01/03/2014   MCV 88.4 01/03/2014   PLT 462* 01/03/2014   NEUTROABS 7.4 01/01/2014    ASSESSMENT & PLAN:  AL Amyloidosis with kidney, cardiac and bone marrow involvement: Because of rapidly declining performance status and deterioration of his general health condition I agree with the patient's power of attorney that hospice care is a reasonable option.  Patient's significant other was extremely emotional and tearful during the entire visit.  I will request hospice care to assist in that end of life care. I provided emotional support and counseling for grief to the patient's significant other.    Sabas SousGudena, Clovis Mankins K, MD 01/30/2014 4:41 PM

## 2014-01-31 NOTE — Progress Notes (Signed)
This encounter was created in error - please disregard.

## 2014-02-01 ENCOUNTER — Encounter: Payer: Medicare Other | Admitting: Internal Medicine

## 2014-02-05 ENCOUNTER — Telehealth: Payer: Self-pay

## 2014-02-05 NOTE — Telephone Encounter (Signed)
Disharge summary rcvd from Hospice by fax dtd 02/05/14.  Copy to Dr Pamelia HoitGudena.  Original to scan.

## 2014-02-07 ENCOUNTER — Ambulatory Visit: Payer: Medicare Other | Admitting: Physical Medicine & Rehabilitation

## 2014-03-04 DEATH — deceased

## 2014-06-24 NOTE — Telephone Encounter (Signed)
na

## 2015-04-16 IMAGING — CT CT BIOPSY
1 of 3 series · 15 of 32 positions shown, 19 images · non-contrast
Comparison: none

CLINICAL DATA: 73-year-old with history of amyloidosis and request
for bone marrow biopsy.

[Series 2: i-spiral 5.0 b40f · axial · 0.75mm/px · z∈[-309,-64]mm · 15 of 79 slices shown, 19 images]
[im 6/79  soft-tissue]
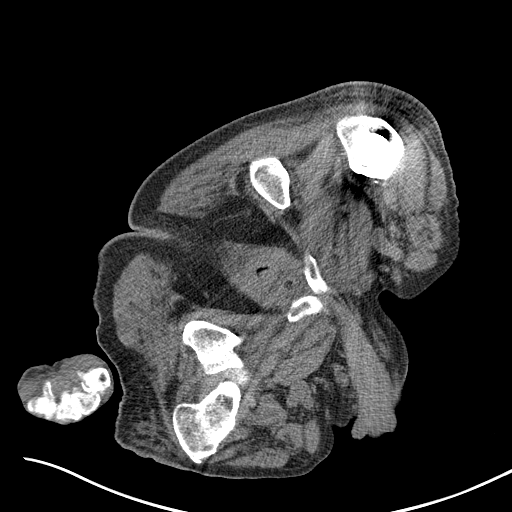
[im 6/79  bone]
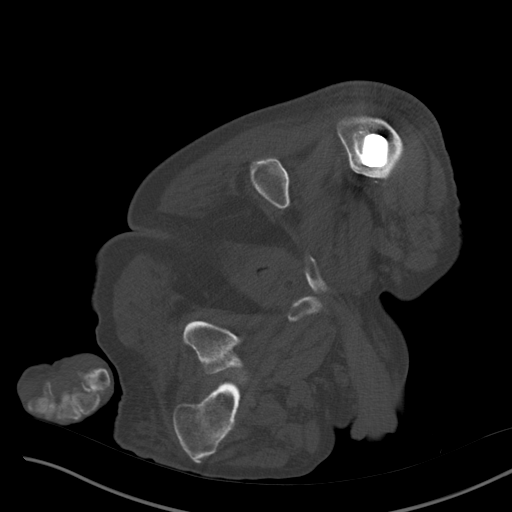
[im 12/79  soft-tissue]
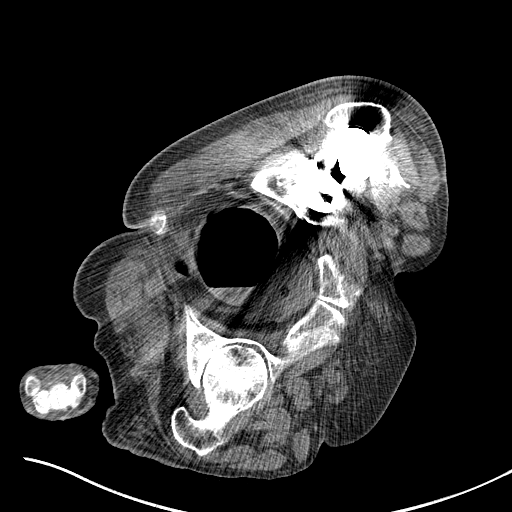
[im 18/79  soft-tissue]
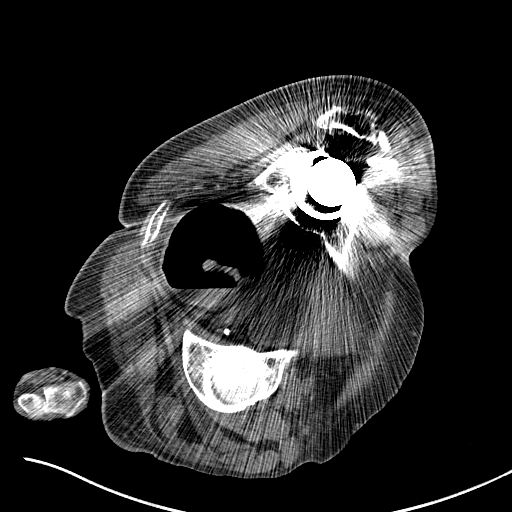
[im 24/79  soft-tissue]
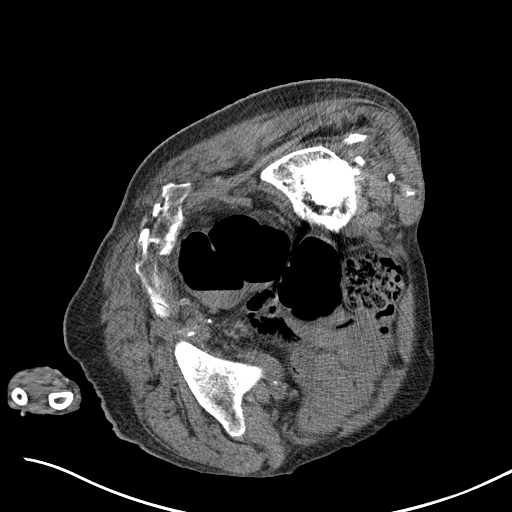
[im 29/79  soft-tissue]
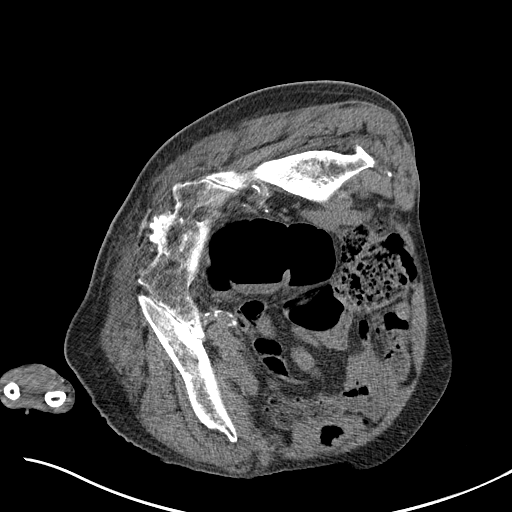
[im 35/79  soft-tissue]
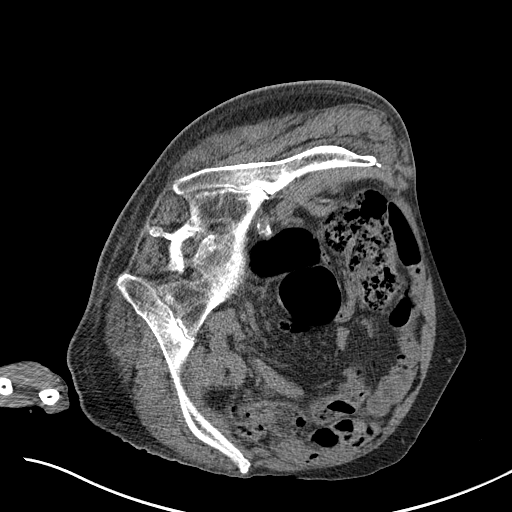
[im 41/79  soft-tissue]
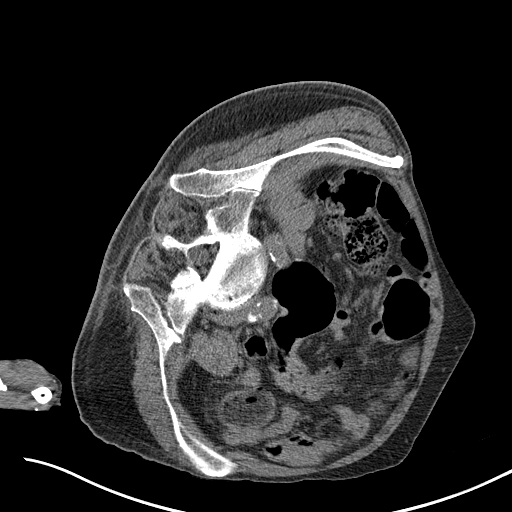
[im 47/79  soft-tissue]
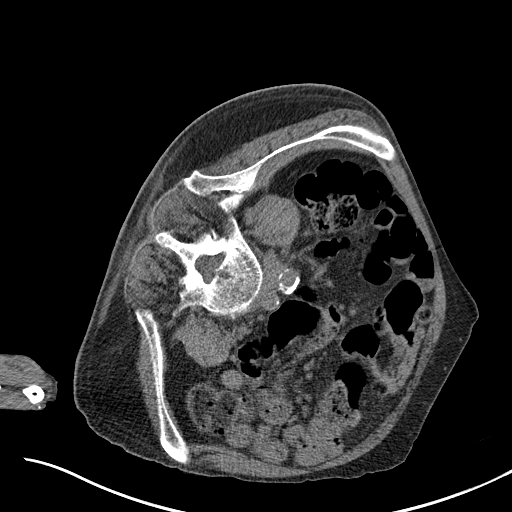
[im 53/79  soft-tissue]
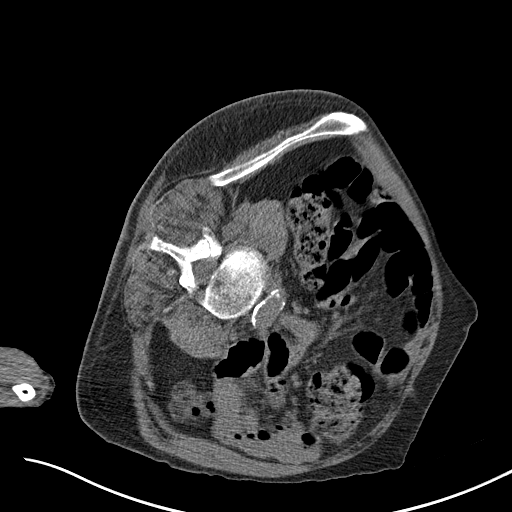
[im 53/79  bone]
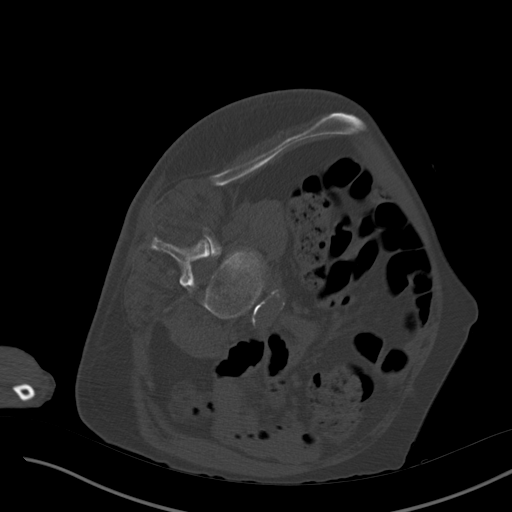
[im 58/79  soft-tissue]
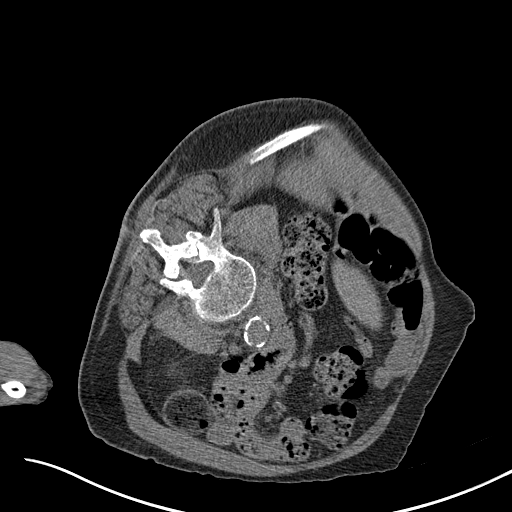
[im 64/79  soft-tissue]
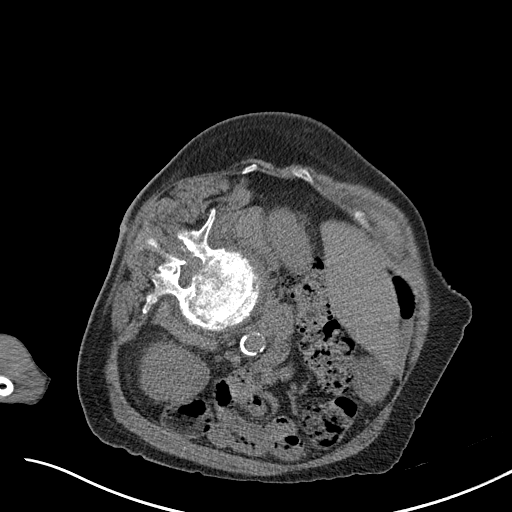
[im 67/79  lung]
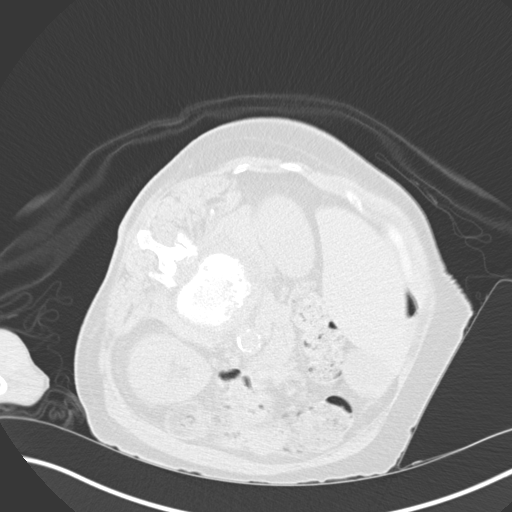
[im 70/79  soft-tissue]
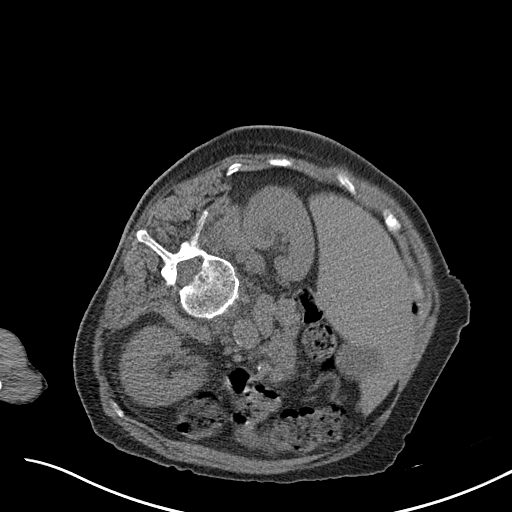
[im 70/79  lung]
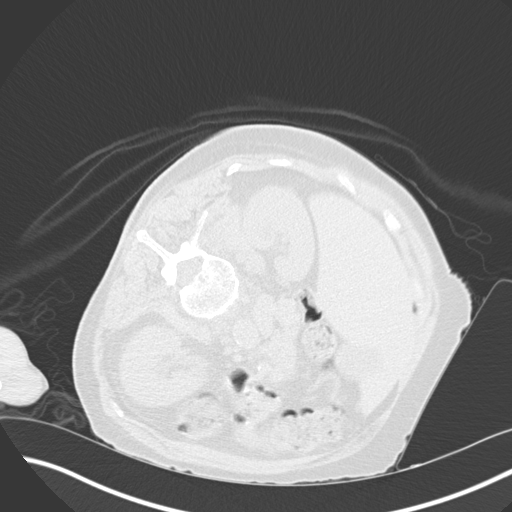
[im 73/79  lung]
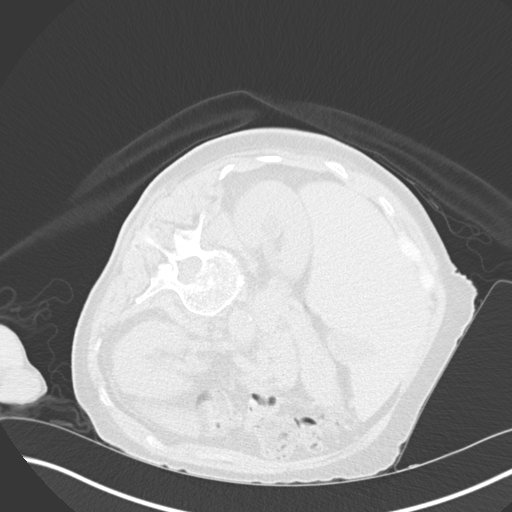
[im 76/79  soft-tissue]
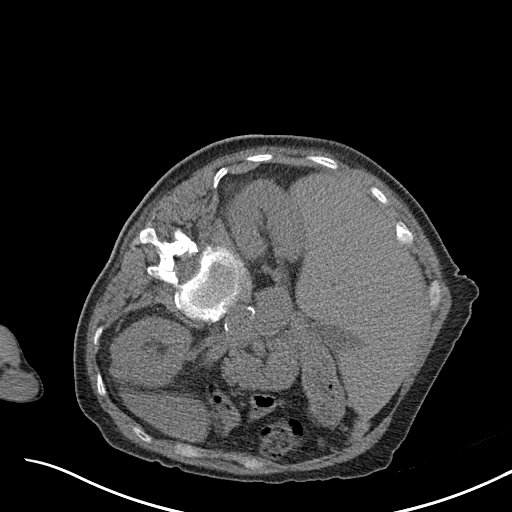
[im 76/79  lung]
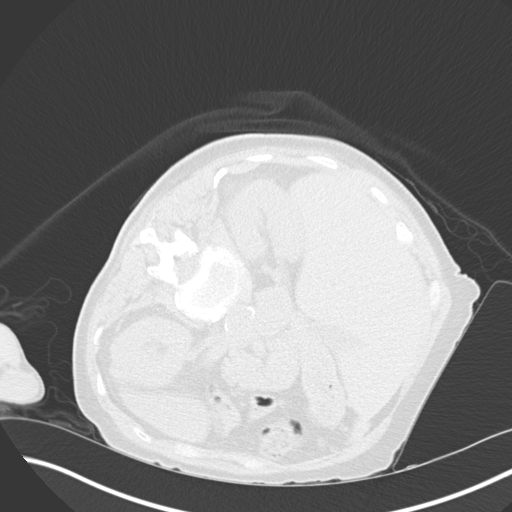

[15 of 32 positions shown; findings below may reference images not displayed]

EXAM:
CT GUIDED BONE MARROW ASPIRATES AND BIOPSY

MEDICATIONS:
1 mg versed, 25 mcg fentanyl. A radiology nurse monitored the
patient for moderate sedation.

ANESTHESIA/SEDATION:
Sedation time: 11 min

PROCEDURE:
Informed consent was obtained for a bone marrow biopsy. The patient
was placed on his left side. Images of the pelvis were obtained. The
left side of back was prepped and draped in sterile fashion. The
skin and left posterior iliac bone were anesthetized with 1%
lidocaine. 11 gauge bone needle was directed into the left iliac
bone with CT guidance. Two aspirates and 1 core biopsy obtained.
FINDINGS: Bone needle directed into the posterior left iliac bone.

COMPLICATIONS:
None
IMPRESSION: CT guided bone marrow aspirates and core biopsy.
